# Patient Record
Sex: Male | Born: 1945 | Race: White | Hispanic: No | Marital: Married | Smoking: Former smoker
Health system: Southern US, Community
[De-identification: ages and names within clinical notes are randomized; demographics above are authoritative.]

## PROBLEM LIST (undated history)

## (undated) DIAGNOSIS — E785 Hyperlipidemia, unspecified: Secondary | ICD-10-CM

## (undated) DIAGNOSIS — E291 Testicular hypofunction: Secondary | ICD-10-CM

## (undated) DIAGNOSIS — K649 Unspecified hemorrhoids: Secondary | ICD-10-CM

## (undated) DIAGNOSIS — E039 Hypothyroidism, unspecified: Secondary | ICD-10-CM

## (undated) DIAGNOSIS — K5730 Diverticulosis of large intestine without perforation or abscess without bleeding: Secondary | ICD-10-CM

## (undated) DIAGNOSIS — N529 Male erectile dysfunction, unspecified: Secondary | ICD-10-CM

## (undated) DIAGNOSIS — N189 Chronic kidney disease, unspecified: Secondary | ICD-10-CM

## (undated) DIAGNOSIS — I10 Essential (primary) hypertension: Secondary | ICD-10-CM

## (undated) HISTORY — DX: Hypothyroidism, unspecified: E03.9

## (undated) HISTORY — DX: Male erectile dysfunction, unspecified: N52.9

## (undated) HISTORY — DX: Hyperlipidemia, unspecified: E78.5

## (undated) HISTORY — PX: HEMORRHOID SURGERY: SHX153

## (undated) HISTORY — DX: Testicular hypofunction: E29.1

## (undated) HISTORY — DX: Unspecified hemorrhoids: K64.9

## (undated) HISTORY — DX: Diverticulosis of large intestine without perforation or abscess without bleeding: K57.30

## (undated) HISTORY — PX: INGUINAL HERNIA REPAIR: SHX194

## (undated) HISTORY — DX: Chronic kidney disease, unspecified: N18.9

## (undated) HISTORY — PX: OTHER SURGICAL HISTORY: SHX169

## (undated) HISTORY — DX: Essential (primary) hypertension: I10

---

## 2002-05-01 ENCOUNTER — Encounter: Payer: Self-pay | Admitting: Endocrinology

## 2002-05-01 ENCOUNTER — Encounter: Admission: RE | Admit: 2002-05-01 | Discharge: 2002-05-01 | Payer: Self-pay | Admitting: Endocrinology

## 2004-12-11 ENCOUNTER — Ambulatory Visit: Payer: Self-pay | Admitting: Internal Medicine

## 2004-12-18 ENCOUNTER — Ambulatory Visit: Payer: Self-pay | Admitting: Internal Medicine

## 2005-12-24 ENCOUNTER — Ambulatory Visit: Payer: Self-pay | Admitting: Internal Medicine

## 2006-02-21 ENCOUNTER — Ambulatory Visit: Payer: Self-pay | Admitting: Internal Medicine

## 2006-04-06 ENCOUNTER — Ambulatory Visit (HOSPITAL_BASED_OUTPATIENT_CLINIC_OR_DEPARTMENT_OTHER): Admission: RE | Admit: 2006-04-06 | Discharge: 2006-04-06 | Payer: Self-pay

## 2006-04-12 ENCOUNTER — Ambulatory Visit: Payer: Self-pay | Admitting: Internal Medicine

## 2006-05-24 ENCOUNTER — Ambulatory Visit: Payer: Self-pay | Admitting: Internal Medicine

## 2006-05-31 ENCOUNTER — Ambulatory Visit: Payer: Self-pay | Admitting: Internal Medicine

## 2006-06-17 ENCOUNTER — Ambulatory Visit: Payer: Self-pay | Admitting: Internal Medicine

## 2006-08-22 ENCOUNTER — Ambulatory Visit: Payer: Self-pay | Admitting: Internal Medicine

## 2006-08-22 LAB — CONVERTED CEMR LAB
ALT: 23 units/L (ref 0–40)
AST: 25 units/L (ref 0–37)
Cholesterol: 172 mg/dL (ref 0–200)
Direct LDL: 87.7 mg/dL
HDL: 36.8 mg/dL — ABNORMAL LOW (ref >39.0)
Total CHOL/HDL Ratio: 4.7
Triglycerides: 263 mg/dL (ref 0–149)
VLDL: 53 mg/dL — ABNORMAL HIGH (ref 0–40)

## 2006-08-24 ENCOUNTER — Ambulatory Visit: Payer: Self-pay | Admitting: Internal Medicine

## 2007-02-10 ENCOUNTER — Encounter: Payer: Self-pay | Admitting: Internal Medicine

## 2007-02-10 DIAGNOSIS — E785 Hyperlipidemia, unspecified: Secondary | ICD-10-CM | POA: Insufficient documentation

## 2007-02-10 DIAGNOSIS — J309 Allergic rhinitis, unspecified: Secondary | ICD-10-CM | POA: Insufficient documentation

## 2007-03-09 ENCOUNTER — Ambulatory Visit: Payer: Self-pay | Admitting: Internal Medicine

## 2007-03-09 LAB — CONVERTED CEMR LAB
ALT: 22 units/L (ref 0–53)
AST: 23 units/L (ref 0–37)
Albumin: 3.8 g/dL (ref 3.5–5.2)
Alkaline Phosphatase: 82 units/L (ref 39–117)
BUN: 14 mg/dL (ref 6–23)
Bacteria, UA: NEGATIVE
Basophils Absolute: 0 10*3/uL (ref 0.0–0.1)
Basophils Relative: 0.6 % (ref 0.0–1.0)
Bilirubin Urine: NEGATIVE
Bilirubin, Direct: 0.1 mg/dL (ref 0.0–0.3)
CO2: 29 meq/L (ref 19–32)
Calcium: 9.2 mg/dL (ref 8.4–10.5)
Chloride: 109 meq/L (ref 96–112)
Cholesterol: 228 mg/dL (ref 0–200)
Creatinine, Ser: 1.1 mg/dL (ref 0.4–1.5)
Direct LDL: 68.2 mg/dL
Eosinophils Absolute: 0.2 10*3/uL (ref 0.0–0.6)
Eosinophils Relative: 3.8 % (ref 0.0–5.0)
GFR calc Af Amer: 88 mL/min
GFR calc non Af Amer: 73 mL/min
Glucose, Bld: 109 mg/dL — ABNORMAL HIGH (ref 70–99)
HCT: 44 % (ref 39.0–52.0)
HDL: 33.1 mg/dL — ABNORMAL LOW (ref >39.0)
Hemoglobin: 15.3 g/dL (ref 13.0–17.0)
Ketones, ur: NEGATIVE mg/dL
Leukocytes, UA: NEGATIVE
Lymphocytes Relative: 29.2 % (ref 12.0–46.0)
MCHC: 34.7 g/dL (ref 30.0–36.0)
MCV: 90.9 fL (ref 78.0–100.0)
Monocytes Absolute: 0.6 10*3/uL (ref 0.2–0.7)
Monocytes Relative: 11.1 % — ABNORMAL HIGH (ref 3.0–11.0)
Neutro Abs: 3.2 10*3/uL (ref 1.4–7.7)
Neutrophils Relative %: 55.3 % (ref 43.0–77.0)
Nitrite: NEGATIVE
PSA: 1.99 ng/mL (ref 0.10–4.00)
Platelets: 247 10*3/uL (ref 150–400)
Potassium: 5 meq/L (ref 3.5–5.1)
RBC: 4.85 M/uL (ref 4.22–5.81)
RDW: 12.8 % (ref 11.5–14.6)
Sodium: 143 meq/L (ref 135–145)
Specific Gravity, Urine: 1.025 (ref 1.000–1.03)
TSH: 0.06 microintl units/mL — ABNORMAL LOW (ref 0.35–5.50)
Total Bilirubin: 0.8 mg/dL (ref 0.3–1.2)
Total CHOL/HDL Ratio: 6.9
Total Protein, Urine: NEGATIVE mg/dL
Total Protein: 7.1 g/dL (ref 6.0–8.3)
Triglycerides: 699 mg/dL (ref 0–149)
Urine Glucose: NEGATIVE mg/dL
Urobilinogen, UA: 0.2 (ref 0.0–1.0)
VLDL: 140 mg/dL — ABNORMAL HIGH (ref 0–40)
Vit D, 1,25-Dihydroxy: 23 — ABNORMAL LOW (ref 30–89)
WBC: 5.6 10*3/uL (ref 4.5–10.5)
pH: 5.5 (ref 5.0–8.0)

## 2007-03-17 ENCOUNTER — Ambulatory Visit: Payer: Self-pay | Admitting: Internal Medicine

## 2007-03-17 ENCOUNTER — Encounter (INDEPENDENT_AMBULATORY_CARE_PROVIDER_SITE_OTHER): Payer: Self-pay | Admitting: *Deleted

## 2007-03-17 DIAGNOSIS — E039 Hypothyroidism, unspecified: Secondary | ICD-10-CM | POA: Insufficient documentation

## 2007-03-17 DIAGNOSIS — R030 Elevated blood-pressure reading, without diagnosis of hypertension: Secondary | ICD-10-CM | POA: Insufficient documentation

## 2007-03-17 DIAGNOSIS — K648 Other hemorrhoids: Secondary | ICD-10-CM | POA: Insufficient documentation

## 2007-03-17 DIAGNOSIS — K5730 Diverticulosis of large intestine without perforation or abscess without bleeding: Secondary | ICD-10-CM | POA: Insufficient documentation

## 2007-03-17 DIAGNOSIS — E559 Vitamin D deficiency, unspecified: Secondary | ICD-10-CM | POA: Insufficient documentation

## 2007-03-17 DIAGNOSIS — J209 Acute bronchitis, unspecified: Secondary | ICD-10-CM | POA: Insufficient documentation

## 2007-04-12 ENCOUNTER — Telehealth (INDEPENDENT_AMBULATORY_CARE_PROVIDER_SITE_OTHER): Payer: Self-pay | Admitting: *Deleted

## 2007-04-14 ENCOUNTER — Ambulatory Visit: Payer: Self-pay | Admitting: Internal Medicine

## 2007-04-14 DIAGNOSIS — M79609 Pain in unspecified limb: Secondary | ICD-10-CM | POA: Insufficient documentation

## 2007-04-14 DIAGNOSIS — M545 Low back pain, unspecified: Secondary | ICD-10-CM | POA: Insufficient documentation

## 2007-04-26 ENCOUNTER — Telehealth: Payer: Self-pay | Admitting: Internal Medicine

## 2007-05-05 ENCOUNTER — Encounter: Payer: Self-pay | Admitting: Internal Medicine

## 2007-06-15 ENCOUNTER — Encounter: Payer: Self-pay | Admitting: Internal Medicine

## 2007-08-02 ENCOUNTER — Ambulatory Visit: Payer: Self-pay | Admitting: Internal Medicine

## 2007-08-02 DIAGNOSIS — N529 Male erectile dysfunction, unspecified: Secondary | ICD-10-CM | POA: Insufficient documentation

## 2007-08-02 DIAGNOSIS — I10 Essential (primary) hypertension: Secondary | ICD-10-CM | POA: Insufficient documentation

## 2007-08-04 ENCOUNTER — Encounter: Payer: Self-pay | Admitting: Internal Medicine

## 2007-08-04 ENCOUNTER — Telehealth: Payer: Self-pay | Admitting: Internal Medicine

## 2007-11-29 ENCOUNTER — Ambulatory Visit: Payer: Self-pay | Admitting: Internal Medicine

## 2008-03-14 ENCOUNTER — Encounter: Payer: Self-pay | Admitting: Internal Medicine

## 2008-03-25 ENCOUNTER — Ambulatory Visit: Payer: Self-pay | Admitting: Internal Medicine

## 2008-03-25 LAB — CONVERTED CEMR LAB: Vit D, 1,25-Dihydroxy: 47 (ref 30–89)

## 2008-03-26 LAB — CONVERTED CEMR LAB
ALT: 19 units/L (ref 0–53)
AST: 22 units/L (ref 0–37)
Albumin: 3.8 g/dL (ref 3.5–5.2)
Alkaline Phosphatase: 65 units/L (ref 39–117)
BUN: 22 mg/dL (ref 6–23)
Bilirubin, Direct: 0.1 mg/dL (ref 0.0–0.3)
CO2: 27 meq/L (ref 19–32)
Calcium: 9.1 mg/dL (ref 8.4–10.5)
Chloride: 112 meq/L (ref 96–112)
Cholesterol: 169 mg/dL (ref 0–200)
Creatinine, Ser: 1.1 mg/dL (ref 0.4–1.5)
Direct LDL: 66.1 mg/dL
GFR calc Af Amer: 88 mL/min
GFR calc non Af Amer: 72 mL/min
Glucose, Bld: 109 mg/dL — ABNORMAL HIGH (ref 70–99)
HDL: 33.5 mg/dL — ABNORMAL LOW (ref >39.0)
Potassium: 4.7 meq/L (ref 3.5–5.1)
Sodium: 143 meq/L (ref 135–145)
TSH: 0.03 microintl units/mL — ABNORMAL LOW (ref 0.35–5.50)
Total Bilirubin: 0.6 mg/dL (ref 0.3–1.2)
Total CHOL/HDL Ratio: 5
Total Protein: 6.9 g/dL (ref 6.0–8.3)
Triglycerides: 244 mg/dL (ref 0–149)
VLDL: 49 mg/dL — ABNORMAL HIGH (ref 0–40)

## 2008-04-03 ENCOUNTER — Ambulatory Visit: Payer: Self-pay | Admitting: Internal Medicine

## 2008-07-26 ENCOUNTER — Ambulatory Visit: Payer: Self-pay | Admitting: Internal Medicine

## 2008-07-30 LAB — CONVERTED CEMR LAB: TSH: 24.96 microintl units/mL — ABNORMAL HIGH (ref 0.35–5.50)

## 2008-08-01 ENCOUNTER — Ambulatory Visit: Payer: Self-pay | Admitting: Internal Medicine

## 2008-08-16 ENCOUNTER — Encounter: Payer: Self-pay | Admitting: Internal Medicine

## 2008-09-05 ENCOUNTER — Ambulatory Visit: Payer: Self-pay | Admitting: Internal Medicine

## 2008-09-05 LAB — CONVERTED CEMR LAB: TSH: 26.15 microintl units/mL — ABNORMAL HIGH (ref 0.35–5.50)

## 2008-09-06 ENCOUNTER — Telehealth: Payer: Self-pay | Admitting: Internal Medicine

## 2008-11-04 ENCOUNTER — Ambulatory Visit: Payer: Self-pay | Admitting: Internal Medicine

## 2008-11-04 DIAGNOSIS — R5381 Other malaise: Secondary | ICD-10-CM | POA: Insufficient documentation

## 2008-11-04 DIAGNOSIS — R5383 Other fatigue: Secondary | ICD-10-CM

## 2008-12-03 ENCOUNTER — Telehealth: Payer: Self-pay | Admitting: Internal Medicine

## 2009-02-25 ENCOUNTER — Ambulatory Visit: Payer: Self-pay | Admitting: Internal Medicine

## 2009-02-27 LAB — CONVERTED CEMR LAB
BUN: 19 mg/dL (ref 6–23)
CO2: 28 meq/L (ref 19–32)
Calcium: 9.2 mg/dL (ref 8.4–10.5)
Chloride: 108 meq/L (ref 96–112)
Creatinine, Ser: 1.1 mg/dL (ref 0.4–1.5)
GFR calc non Af Amer: 72.07 mL/min (ref >60)
Glucose, Bld: 95 mg/dL (ref 70–99)
Potassium: 4.9 meq/L (ref 3.5–5.1)
Sodium: 143 meq/L (ref 135–145)
TSH: 0.06 microintl units/mL — ABNORMAL LOW (ref 0.35–5.50)

## 2009-03-05 ENCOUNTER — Ambulatory Visit: Payer: Self-pay | Admitting: Internal Medicine

## 2009-03-05 DIAGNOSIS — Z87891 Personal history of nicotine dependence: Secondary | ICD-10-CM | POA: Insufficient documentation

## 2009-03-05 DIAGNOSIS — E291 Testicular hypofunction: Secondary | ICD-10-CM | POA: Insufficient documentation

## 2009-05-26 ENCOUNTER — Ambulatory Visit: Payer: Self-pay | Admitting: Internal Medicine

## 2009-05-27 LAB — CONVERTED CEMR LAB
ALT: 19 units/L (ref 0–53)
AST: 22 units/L (ref 0–37)
Albumin: 4 g/dL (ref 3.5–5.2)
Alkaline Phosphatase: 60 units/L (ref 39–117)
BUN: 14 mg/dL (ref 6–23)
Basophils Absolute: 0 10*3/uL (ref 0.0–0.1)
Basophils Relative: 0.4 % (ref 0.0–3.0)
Bilirubin, Direct: 0.1 mg/dL (ref 0.0–0.3)
CO2: 22 meq/L (ref 19–32)
Calcium: 9.3 mg/dL (ref 8.4–10.5)
Chloride: 112 meq/L (ref 96–112)
Creatinine, Ser: 1.3 mg/dL (ref 0.4–1.5)
Eosinophils Absolute: 0.2 10*3/uL (ref 0.0–0.7)
Eosinophils Relative: 2.6 % (ref 0.0–5.0)
GFR calc non Af Amer: 59.38 mL/min (ref >60)
Glucose, Bld: 77 mg/dL (ref 70–99)
HCT: 43.4 % (ref 39.0–52.0)
Hemoglobin: 13.9 g/dL (ref 13.0–17.0)
Lymphocytes Relative: 27.4 % (ref 12.0–46.0)
Lymphs Abs: 1.6 10*3/uL (ref 0.7–4.0)
MCHC: 32.1 g/dL (ref 30.0–36.0)
MCV: 88.5 fL (ref 78.0–100.0)
Monocytes Absolute: 0.5 10*3/uL (ref 0.1–1.0)
Monocytes Relative: 7.9 % (ref 3.0–12.0)
Neutro Abs: 3.6 10*3/uL (ref 1.4–7.7)
Neutrophils Relative %: 61.7 % (ref 43.0–77.0)
PSA: 2.03 ng/mL (ref 0.10–4.00)
Platelets: 250 10*3/uL (ref 150.0–400.0)
Potassium: 5.1 meq/L (ref 3.5–5.1)
RBC: 4.91 M/uL (ref 4.22–5.81)
RDW: 15.1 % — ABNORMAL HIGH (ref 11.5–14.6)
Sodium: 147 meq/L — ABNORMAL HIGH (ref 135–145)
TSH: 0.08 microintl units/mL — ABNORMAL LOW (ref 0.35–5.50)
Testosterone: 59.22 ng/dL — ABNORMAL LOW (ref 350.00–890.00)
Total Bilirubin: 0.8 mg/dL (ref 0.3–1.2)
Total Protein: 7 g/dL (ref 6.0–8.3)
WBC: 5.9 10*3/uL (ref 4.5–10.5)

## 2009-06-05 ENCOUNTER — Ambulatory Visit: Payer: Self-pay | Admitting: Internal Medicine

## 2009-07-18 ENCOUNTER — Telehealth: Payer: Self-pay | Admitting: Internal Medicine

## 2009-07-21 ENCOUNTER — Telehealth: Payer: Self-pay | Admitting: Internal Medicine

## 2009-07-21 ENCOUNTER — Encounter: Payer: Self-pay | Admitting: Internal Medicine

## 2009-08-27 ENCOUNTER — Ambulatory Visit: Payer: Self-pay | Admitting: Internal Medicine

## 2009-08-27 LAB — CONVERTED CEMR LAB
Calcium: 8.9 mg/dL (ref 8.4–10.5)
Chloride: 110 meq/L (ref 96–112)
Creatinine, Ser: 1.2 mg/dL (ref 0.4–1.5)
GFR calc non Af Amer: 65.08 mL/min (ref >60)
TSH: 1.49 microintl units/mL (ref 0.35–5.50)
Testosterone: 274.82 ng/dL — ABNORMAL LOW (ref 350.00–890.00)
Total Bilirubin: 0.7 mg/dL (ref 0.3–1.2)

## 2009-09-04 ENCOUNTER — Ambulatory Visit: Payer: Self-pay | Admitting: Internal Medicine

## 2009-10-08 ENCOUNTER — Encounter: Payer: Self-pay | Admitting: Internal Medicine

## 2009-11-21 ENCOUNTER — Telehealth: Payer: Self-pay | Admitting: Internal Medicine

## 2010-01-01 ENCOUNTER — Ambulatory Visit: Payer: Self-pay | Admitting: Internal Medicine

## 2010-01-01 LAB — CONVERTED CEMR LAB
AST: 20 units/L (ref 0–37)
Alkaline Phosphatase: 64 units/L (ref 39–117)
Basophils Absolute: 0 10*3/uL (ref 0.0–0.1)
Bilirubin, Direct: 0.1 mg/dL (ref 0.0–0.3)
HCT: 37.5 % — ABNORMAL LOW (ref 39.0–52.0)
Lymphs Abs: 1.9 10*3/uL (ref 0.7–4.0)
MCV: 82.7 fL (ref 78.0–100.0)
Monocytes Absolute: 0.6 10*3/uL (ref 0.1–1.0)
Neutro Abs: 3.7 10*3/uL (ref 1.4–7.7)
Platelets: 305 10*3/uL (ref 150.0–400.0)
RDW: 16.6 % — ABNORMAL HIGH (ref 11.5–14.6)
Testosterone: 1317.68 ng/dL — ABNORMAL HIGH (ref 350.00–890.00)
Total Bilirubin: 0.6 mg/dL (ref 0.3–1.2)

## 2010-01-05 ENCOUNTER — Ambulatory Visit: Payer: Self-pay | Admitting: Internal Medicine

## 2010-01-05 DIAGNOSIS — R062 Wheezing: Secondary | ICD-10-CM | POA: Insufficient documentation

## 2010-06-09 NOTE — Letter (Signed)
Summary: [facility]   Imported By: Sherian Rein 11/12/2009 15:06:57  _____________________________________________________________________  External Attachment:    Type:   Image     Comment:   External Document

## 2010-06-09 NOTE — Assessment & Plan Note (Signed)
Summary: 3 mo rov /nws  #   Vital Signs:  Patient profile:   65 year old male Weight:      220 pounds BMI:     31.68 Temp:     98.5 degrees F oral Pulse rate:   69 / minute BP sitting:   126 / 90  (left arm)  Vitals Entered By: Tora Perches (June 05, 2009 8:05 AM) CC: f/u Is Patient Diabetic? No   CC:  f/u.  History of Present Illness: The patient presents for a follow up of hypertension, hypogonadism, hypothyroidism   Preventive Screening-Counseling & Management  Alcohol-Tobacco     Smoking Status: quit  Current Medications (verified): 1)  Allegra-D 12 Hour 60-120 Mg  Tb12 (Fexofenadine-Pseudoephedrine) .... Two Times A Day As Needed 2)  Crestor 20 Mg Tabs (Rosuvastatin Calcium) .Marland Kitchen.. 1 Tab Qod 3)  Enalapril Maleate 10 Mg  Tabs (Enalapril Maleate) .Marland Kitchen.. 1 Once Daily For Blood  Pressure 4)  Diazepam 2 Mg  Tabs (Diazepam) .Marland Kitchen.. 1 At Bedtime Prn 5)  Viagra 100 Mg Tabs (Sildenafil Citrate) .Marland Kitchen.. 1 By Mouth Once Daily As Needed 6)  Androgel Pump 1 % Gel (Testosterone) .Marland Kitchen.. 10g Every Day 7)  Vitamin D3 1000 Unit  Tabs (Cholecalciferol) .Marland Kitchen.. 1 By Mouth Daily 8)  Arginine 500 Mg Tabs (Arginine) .Marland Kitchen.. 1 By Mouth Qd 9)  Synthroid 125 Mcg Tabs (Levothyroxine Sodium) .Marland Kitchen.. 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    Physical Exam  General:  Well-developed,well-nourished,in no acute distress; alert,appropriate and cooperative throughout examination Nose:  External nasal examination shows no deformity or inflammation. Nasal mucosa are pink and moist  without lesions or exudates. Mouth:  Oral mucosa and oropharynx without lesions or exudates.  Teeth in good repair. Neck:  No deformities, masses, or tenderness noted. Lungs:  Normal respiratory effort, chest expands symmetrically. Lungs are clear to auscultation, no crackles or wheezes. Heart:  Normal rate and regular rhythm. S1 and S2 normal without gallop, murmur, click, rub or other extra sounds. Abdomen:  Bowel sounds positive,abdomen soft and non-tender without masses, organomegaly or hernias noted. Msk:  Lumbar-sacral spine is tender to palpation over paraspinal muscles and painfull with the ROM  Neurologic:  No cranial nerve deficits noted. Station and gait are normal. Plantar reflexes are down-going bilaterally. DTRs are  decreesed symmetrical throughout. Sensory, motor and coordinative functions appear intact. Skin:  Intact without suspicious lesions or rashes Psych:  Cognition and judgment appear intact. Alert and cooperative with normal attention span and concentration. No apparent delusions, illusions, hallucinations   Impression & Recommendations:  Problem # 1:  FATIGUE (ICD-780.79) Assessment Improved  Problem # 2:  HYPOGONADISM (ICD-257.2) Assessment: Comment Only The labs were reviewed with the patient. Clinically better - recheck testost in 3 months   Problem # 3:  HYPERTENSION (ICD-401.9) Assessment: Unchanged  His updated medication list for this problem includes:    Enalapril Maleate 10 Mg Tabs (Enalapril maleate) .Marland Kitchen... 1 once daily for blood  pressure  Problem # 4:  HYPOTHYROIDISM (ICD-244.9) Assessment: Comment Only  The following medications were removed from the medication list:    Synthroid 125 Mcg Tabs (  Levothyroxine sodium) .Marland Kitchen... 1 by mouth qd His updated medication list for this problem includes:    Levothroid 100 Mcg Tabs (Levothyroxine sodium) .Marland Kitchen... 1 by mouth once daily for thyroid  Complete Medication List: 1)  Allegra-d 12 Hour 60-120 Mg Tb12  (Fexofenadine-pseudoephedrine) .... Two times a day as needed 2)  Crestor 20 Mg Tabs (Rosuvastatin calcium) .Marland Kitchen.. 1 tab qod 3)  Enalapril Maleate 10 Mg Tabs (Enalapril maleate) .Marland Kitchen.. 1 once daily for blood  pressure 4)  Diazepam 2 Mg Tabs (Diazepam) .Marland Kitchen.. 1 at bedtime prn 5)  Viagra 100 Mg Tabs (Sildenafil citrate) .Marland Kitchen.. 1 by mouth once daily as needed 6)  Androgel Pump 1 % Gel (Testosterone) .Marland Kitchen.. 10g every day 7)  Vitamin D3 1000 Unit Tabs (Cholecalciferol) .Marland Kitchen.. 1 by mouth daily 8)  Arginine 500 Mg Tabs (Arginine) .Marland Kitchen.. 1 by mouth qd 9)  Levothroid 100 Mcg Tabs (Levothyroxine sodium) .Marland Kitchen.. 1 by mouth once daily for thyroid  Patient Instructions: 1)  Please schedule a follow-up appointment in 3 months. 2)  BMP prior to visit, ICD-9: 3)  testost 4)  TSH prior to visit, ICD-9:244.8 5)  Hepatic Panel prior to visit, ICD-9:  Contraindications/Deferment of Procedures/Staging:    Treatment: Flu Shot    Contraindication: other     Test/Procedure: Pneumovax vaccine    Reason for deferment: patient declined  Prescriptions: LEVOTHROID 100 MCG TABS (LEVOTHYROXINE SODIUM) 1 by mouth once daily for thyroid  #30 x 12   Entered and Authorized by:   Tresa Garter MD   Signed by:   Tresa Garter MD on 06/05/2009   Method used:   Print then Give to Patient   RxID:   [id number]

## 2010-06-09 NOTE — Medication Information (Signed)
Summary: Prior authorization/[facility]  Prior authorization/[facility]   Imported By: Lester Noc[place] 07/30/2009 10:44:46  _____________________________________________________________________  External Attachment:    Type:   Image     Comment:   External Document

## 2010-06-09 NOTE — Medication Information (Signed)
Summary: Fexofenadine/[facility]  Fexofenadine/[facility]   Imported By: Sherian Rein 08/12/2009 10:19:08  _____________________________________________________________________  External Attachment:    Type:   Image     Comment:   External Document  Appended Document: Fexofenadine/[facility] MD aware prescription approved. Signed doc.

## 2010-06-09 NOTE — Progress Notes (Signed)
Summary: Rf Viagra  Phone Note Refill Request Message from:  Pharmacy  Refills Requested: Medication #1:  VIAGRA 100 MG TABS 1 by mouth once daily as needed   Dosage confirmed as above?Dosage Confirmed   Supply Requested: #144   Last Refilled: 04/02/2009   Notes: please advise on quantity [facility] [address]  #[phone]   Method Requested: Telephone to Pharmacy Initial call taken by: Lanier Prude, [facility]),  November 21, 2009 4:29 PM  Follow-up for Phone Call        Pharm has pt recieved qty 144, gave refill same as emr records Follow-up by: Lamar Sprinkles, CMA,  November 21, 2009 6:10 PM    Prescriptions: VIAGRA 100 MG TABS (SILDENAFIL CITRATE) 1 by mouth once daily as needed  #12 x 5   Entered by:   Lamar Sprinkles, CMA   Authorized by:   Tresa Garter MD   Signed by:   Lamar Sprinkles, CMA on 11/21/2009   Method used:   Electronically to        [facility] [phone]* (retail)       [address]       [place], [place]  [address]       Ph: [phone]       Fax: [phone]   RxID:   [id number]

## 2010-06-09 NOTE — Assessment & Plan Note (Signed)
Summary: 3 MO ROV /NWS  #   Vital Signs:  Patient profile:   65 year old male Height:      70 inches (177.80 cm) Weight:      220 pounds (100.00 kg) BMI:     31.68 O2 Sat:      97 % on Room air Temp:     98.4 degrees F (36.89 degrees C) oral Pulse rate:   70 / minute Pulse rhythm:   regular BP sitting:   134 / 70  (left arm) Cuff size:   regular  Vitals Entered By: Brenton Grills (September 04, 2009 11:08 AM)  O2 Flow:  Room air CC: 3 mo f/u visit/aj   CC:  3 mo f/u visit/aj.  History of Present Illness: The patient presents for a follow up of hypertension, ED, hypogonadism   Current Medications (verified): 1)  Allegra-D 12 Hour 60-120 Mg  Tb12 (Fexofenadine-Pseudoephedrine) .... Two Times A Day As Needed 2)  Crestor 20 Mg Tabs (Rosuvastatin Calcium) .Marland Kitchen.. 1 Tab Qod 3)  Enalapril Maleate 10 Mg  Tabs (Enalapril Maleate) .Marland Kitchen.. 1 Once Daily For Blood  Pressure 4)  Diazepam 2 Mg  Tabs (Diazepam) .Marland Kitchen.. 1 At Bedtime Prn 5)  Viagra 100 Mg Tabs (Sildenafil Citrate) .Marland Kitchen.. 1 By Mouth Once Daily As Needed 6)  Androgel Pump 1 % Gel (Testosterone) .Marland Kitchen.. 10g Every Day 7)  Vitamin D3 1000 Unit  Tabs (Cholecalciferol) .Marland Kitchen.. 1 By Mouth Daily 8)  Arginine 500 Mg Tabs (Arginine) .Marland Kitchen.. 1 By Mouth Qd 9)  Levothroid 100 Mcg Tabs (Levothyroxine Sodium) .Marland Kitchen.. 1 By Mouth Once Daily For Thyroid  Allergies (verified): 1)  Cialis (Tadalafil)  Past History:  Past Medical History: Reviewed history from 08/02/2007 and no changes required. 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    Physical Exam  General:  Well-developed,well-nourished,in no acute distress; alert,appropriate and cooperative  throughout examination Nose:  External nasal examination shows no deformity or inflammation. Nasal mucosa are pink and moist without lesions or exudates. Mouth:  Oral mucosa and oropharynx without lesions or exudates.  Teeth in good repair. Lungs:  Normal respiratory effort, chest expands symmetrically. Lungs are clear to auscultation, no crackles or wheezes. Heart:  Normal rate and regular rhythm. S1 and S2 normal without gallop, murmur, click, rub or other extra sounds. Abdomen:  Bowel sounds positive,abdomen soft and non-tender without masses, organomegaly or hernias noted. Msk:  Lumbar-sacral spine is tender to palpation over paraspinal muscles and painfull with the ROM  Neurologic:  No cranial nerve deficits noted. Station and gait are normal. Plantar reflexes are down-going bilaterally. DTRs are  decreesed symmetrical throughout. Sensory, motor and coordinative functions appear intact. Skin:  Intact without suspicious lesions or rashes Psych:  Cognition and judgment appear intact. Alert and cooperative with normal attention span and concentration. No apparent delusions, illusions, hallucinations   Impression & Recommendations:  Problem # 1:  HYPERTENSION (ICD-401.9) Assessment Unchanged  His updated medication list for this problem includes:    Enalapril Maleate 10 Mg Tabs (Enalapril maleate) .Marland Kitchen... 1 once daily for blood  pressure  Problem # 2:  HYPOTHYROIDISM (ICD-244.9) Assessment: Improved  His updated medication list for this problem includes:    Levothroid 100 Mcg Tabs (Levothyroxine sodium) .Marland Kitchen... 1 by  mouth once daily for thyroid  Problem # 3:  HYPOGONADISM (ICD-257.2) Assessment: Improved On prescription therapy   Problem # 4:  ERECTILE DYSFUNCTION (QMV-784.69) Assessment: Improved  His updated medication list for this problem includes:    Viagra 100 Mg Tabs (Sildenafil citrate) .Marland Kitchen... 1 by mouth once daily as needed  Complete Medication List: 1)  Allegra-d 12 Hour  60-120 Mg Tb12 (Fexofenadine-pseudoephedrine) .... Two times a day as needed 2)  Crestor 20 Mg Tabs (Rosuvastatin calcium) .Marland Kitchen.. 1 tab qod 3)  Enalapril Maleate 10 Mg Tabs (Enalapril maleate) .Marland Kitchen.. 1 once daily for blood  pressure 4)  Diazepam 2 Mg Tabs (Diazepam) .Marland Kitchen.. 1 at bedtime prn 5)  Viagra 100 Mg Tabs (Sildenafil citrate) .Marland Kitchen.. 1 by mouth once daily as needed 6)  Androgel Pump 1 % Gel (Testosterone) .Marland Kitchen.. 10g every day 7)  Vitamin D3 1000 Unit Tabs (Cholecalciferol) .Marland Kitchen.. 1 by mouth daily 8)  Arginine 500 Mg Tabs (Arginine) .Marland Kitchen.. 1 by mouth qd 9)  Levothroid 100 Mcg Tabs (Levothyroxine sodium) .Marland Kitchen.. 1 by mouth once daily for thyroid  Patient Instructions: 1)  Please schedule a follow-up appointment in 4 months. 2)  Hepatic Panel prior to visit, ICD-9: 3)  CBC w/ Diff prior to visit, ICD-9: 4)  Testosterone 5)  TSH  244.8 995.20

## 2010-06-09 NOTE — Progress Notes (Signed)
Summary: Allegra PA  Phone Note From Pharmacy   Summary of Call: PA request--Allegra-D. Berkley Harvey was available via [email or web address], approved until 2012. Initial call taken by: Lucious Groves,  July 21, 2009 9:34 AM

## 2010-06-09 NOTE — Assessment & Plan Note (Signed)
Summary: 4 MO ROV /NWS  #   Vital Signs:  Patient profile:   65 year old male Height:      70 inches Weight:      228 pounds BMI:     32.83 O2 Sat:      96 % on Room air Temp:     98.6 degrees F oral Pulse rate:   73 / minute Pulse rhythm:   regular Resp:     16 per minute BP sitting:   148 / 64  (left arm) Cuff size:   regular  Vitals Entered By: Lanier Prude, CMA(AAMA) (January 05, 2010 7:57 AM)  O2 Flow:  Room air CC: 4 mo f/u Is Patient Diabetic? No   CC:  4 mo f/u.  History of Present Illness: The patient presents for a follow up of hypertension, hypothyroidism, hyperlipidemia C/o wt gain. Feeling better. He had some wheezing running  Current Medications (verified): 1)  Allegra-D 12 Hour 60-120 Mg  Tb12 (Fexofenadine-Pseudoephedrine) .... Two Times A Day As Needed 2)  Crestor 20 Mg Tabs (Rosuvastatin Calcium) .Marland Kitchen.. 1 Tab Qod 3)  Enalapril Maleate 10 Mg  Tabs (Enalapril Maleate) .Marland Kitchen.. 1 Once Daily For Blood  Pressure 4)  Diazepam 2 Mg  Tabs (Diazepam) .Marland Kitchen.. 1 At Bedtime Prn 5)  Viagra 100 Mg Tabs (Sildenafil Citrate) .Marland Kitchen.. 1 By Mouth Once Daily As Needed 6)  Androgel Pump 1 % Gel (Testosterone) .Marland Kitchen.. 10g Every Day 7)  Vitamin D3 1000 Unit  Tabs (Cholecalciferol) .Marland Kitchen.. 1 By Mouth Daily 8)  Arginine 500 Mg Tabs (Arginine) .Marland Kitchen.. 1 By Mouth Qd 9)  Levothroid 100 Mcg Tabs (Levothyroxine Sodium) .Marland Kitchen.. 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    Physical Exam  General:  Well-developed,well-nourished,in no acute distress; alert,appropriate and cooperative throughout examination Mouth:  Oral mucosa and oropharynx without lesions or exudates.  Teeth in good repair. Lungs:  Normal respiratory effort, chest expands symmetrically. Lungs are clear to auscultation, no crackles or wheezes. Heart:  Normal rate and regular rhythm. S1 and S2 normal without gallop, murmur, click, rub or other extra sounds. Abdomen:  Bowel sounds positive,abdomen soft and non-tender without masses, organomegaly or hernias noted. Msk:  No deformity or scoliosis noted of thoracic or lumbar spine.   Extremities:  No clubbing, cyanosis, edema, or deformity noted with normal full range of motion of all joints.   Neurologic:  No cranial nerve deficits noted. Station and gait are normal. Plantar reflexes are down-going bilaterally. DTRs are symmetrical throughout. Sensory, motor and coordinative functions appear intact. Skin:  Intact without suspicious lesions or rashes Psych:  Cognition and judgment appear intact. Alert and cooperative with normal attention span and concentration. No apparent delusions, illusions, hallucinations   Impression & Recommendations:  Problem # 1:  HYPOGONADISM (ICD-257.2) Assessment Improved reduce Androgel The labs were reviewed with the patient.   Problem # 2:  FATIGUE (ICD-780.79) Assessment: Improved  Problem # 3:  ERECTILE DYSFUNCTION (VHQ-469.62)  Assessment: Improved  His updated medication list for this problem includes:    Viagra 100 Mg Tabs (Sildenafil citrate) .Marland Kitchen... 1 by mouth once daily as needed  Problem # 4:  HYPERLIPIDEMIA (ICD-272.4) Assessment: Unchanged  His updated medication list for this problem includes:    Crestor 20 Mg Tabs (Rosuvastatin  calcium) .Marland Kitchen... 1 tab qod  Problem # 5:  HYPERTENSION (ICD-401.9) Assessment: Deteriorated Loose wt! His updated medication list for this problem includes:    Enalapril Maleate 10 Mg Tabs (Enalapril maleate) .Marland Kitchen... 1 once daily for blood  pressure  Problem # 6:  VITAMIN D DEFICIENCY (ICD-268.9) Assessment: Unchanged On the regimen of medicine(s) reflected in the chart    Problem # 7:  WHEEZING (ICD-786.07) ? asthma Assessment: New Proair Loose wt  Complete Medication List: 1)  Allegra-d 12 Hour 60-120 Mg Tb12 (Fexofenadine-pseudoephedrine) .... Two times a day as needed 2)  Crestor 20 Mg Tabs (Rosuvastatin calcium) .Marland Kitchen.. 1 tab qod 3)  Enalapril Maleate 10 Mg Tabs (Enalapril maleate) .Marland Kitchen.. 1 once daily for blood  pressure 4)  Diazepam 2 Mg Tabs (Diazepam) .Marland Kitchen.. 1 at bedtime prn 5)  Viagra 100 Mg Tabs (Sildenafil citrate) .Marland Kitchen.. 1 by mouth once daily as needed 6)  Androgel Pump 1 % Gel (Testosterone) .Marland Kitchen.. 10g every day 7)  Vitamin D3 1000 Unit Tabs (Cholecalciferol) .Marland Kitchen.. 1 by mouth daily 8)  Arginine 500 Mg Tabs (Arginine) .Marland Kitchen.. 1 by mouth qd 9)  Levothroid 112 Mcg Tabs (Levothyroxine sodium) .Marland Kitchen.. 1 by mouth qd 10)  Proair Hfa 108 (90 Base) Mcg/act Aers (Albuterol sulfate) .... 2 inh qid as needed  Patient Instructions: 1)  Please schedule a follow-up appointment in 4-6 months well w/labs and testost 257.20.  Contraindications/Deferment of Procedures/Staging:    Test/Procedure: FLU VAX    Reason for deferment: patient declined  Prescriptions: PROAIR HFA 108 (90 BASE) MCG/ACT AERS (ALBUTEROL SULFATE) 2 inh qid as needed  #3 x 3   Entered and Authorized by:   Tresa Garter MD   Signed by:   Tresa Garter MD on 01/05/2010   Method used:   Print then Give to Patient   RxID:   [id number] LEVOTHROID 112 MCG TABS (LEVOTHYROXINE SODIUM) 1 by mouth qd  #30 x 12   Entered and Authorized by:   Tresa Garter MD   Signed by:   Tresa Garter MD on 01/05/2010    Method used:   Electronically to        [facility] [phone]* (retail)       [address]       [place], [place]  [address]       Ph: [phone]       Fax: [phone]   RxID:   [id number]

## 2010-06-09 NOTE — Progress Notes (Signed)
Summary: Androgel PA  Phone Note Call from Patient Call back at [facility] Phone [phone]   Caller: [employer] [phone] Call For: ID:  [id number]  Case: [id number] Summary of Call: Patient called requesting refill of Allegra and to check the status of Androgel PA. I made the patient aware that we had not rec'd anything from his pharmacy, but would initiate PA anyway. Patient coverage is via [employer]. [employer] will fax forms. Initial call taken by: Lucious Groves,  July 18, 2009 1:24 PM  Follow-up for Phone Call        Form was completed and faxed. We will await reply from insurance company. Follow-up by: Lucious Groves,  July 28, 2009 8:25 AM    Prescriptions: ALLEGRA-D 12 HOUR 60-120 MG  TB12 (FEXOFENADINE-PSEUDOEPHEDRINE) two times a day as needed  #60.0 Each x 7   Entered by:   Lucious Groves   Authorized by:   Tresa Garter MD   Signed by:   Lucious Groves on 07/18/2009   Method used:   Electronically to        [facility] [address] [phone]* (retail)       [address]       [place], [place]  [address]       Ph: [phone]       Fax: [phone]   RxID:   [id number]   Appended Document: Androgel PA approved until 2016.

## 2010-06-26 ENCOUNTER — Other Ambulatory Visit: Payer: Self-pay | Admitting: Internal Medicine

## 2010-06-26 ENCOUNTER — Other Ambulatory Visit: Payer: BC Managed Care – PPO

## 2010-06-26 ENCOUNTER — Encounter (INDEPENDENT_AMBULATORY_CARE_PROVIDER_SITE_OTHER): Payer: Self-pay | Admitting: *Deleted

## 2010-06-26 DIAGNOSIS — Z0389 Encounter for observation for other suspected diseases and conditions ruled out: Secondary | ICD-10-CM

## 2010-06-26 DIAGNOSIS — E291 Testicular hypofunction: Secondary | ICD-10-CM

## 2010-06-26 DIAGNOSIS — E785 Hyperlipidemia, unspecified: Secondary | ICD-10-CM

## 2010-06-26 DIAGNOSIS — Z0000 Encounter for general adult medical examination without abnormal findings: Secondary | ICD-10-CM

## 2010-06-26 LAB — LIPID PANEL
HDL: 41.6 mg/dL (ref >39.00)
Total CHOL/HDL Ratio: 4

## 2010-06-26 LAB — CBC WITH DIFFERENTIAL/PLATELET
Eosinophils Absolute: 0.2 10*3/uL (ref 0.0–0.7)
Eosinophils Relative: 2.9 % (ref 0.0–5.0)
HCT: 41.5 % (ref 39.0–52.0)
Lymphs Abs: 2 10*3/uL (ref 0.7–4.0)
MCHC: 34 g/dL (ref 30.0–36.0)
MCV: 92.7 fl (ref 78.0–100.0)
Monocytes Absolute: 0.6 10*3/uL (ref 0.1–1.0)
Neutrophils Relative %: 62.7 % (ref 43.0–77.0)
Platelets: 273 10*3/uL (ref 150.0–400.0)
WBC: 7.5 10*3/uL (ref 4.5–10.5)

## 2010-06-26 LAB — TSH: TSH: 3.07 u[IU]/mL (ref 0.35–5.50)

## 2010-06-26 LAB — URINALYSIS
Bilirubin Urine: NEGATIVE
Hgb urine dipstick: NEGATIVE
Leukocytes, UA: NEGATIVE
Nitrite: NEGATIVE
pH: 5.5 (ref 5.0–8.0)

## 2010-06-26 LAB — HEPATIC FUNCTION PANEL
ALT: 19 U/L (ref 0–53)
Total Bilirubin: 0.7 mg/dL (ref 0.3–1.2)
Total Protein: 6.9 g/dL (ref 6.0–8.3)

## 2010-06-26 LAB — BASIC METABOLIC PANEL
CO2: 28 mEq/L (ref 19–32)
GFR: 55.24 mL/min — ABNORMAL LOW (ref >60.00)
Glucose, Bld: 88 mg/dL (ref 70–99)
Potassium: 5.5 mEq/L — ABNORMAL HIGH (ref 3.5–5.1)
Sodium: 141 mEq/L (ref 135–145)

## 2010-06-26 LAB — PSA: PSA: 2.97 ng/mL (ref 0.10–4.00)

## 2010-07-01 ENCOUNTER — Encounter: Payer: Self-pay | Admitting: Internal Medicine

## 2010-07-03 ENCOUNTER — Encounter (INDEPENDENT_AMBULATORY_CARE_PROVIDER_SITE_OTHER): Payer: BC Managed Care – PPO | Admitting: Internal Medicine

## 2010-07-03 ENCOUNTER — Encounter: Payer: Self-pay | Admitting: Internal Medicine

## 2010-07-03 DIAGNOSIS — E291 Testicular hypofunction: Secondary | ICD-10-CM

## 2010-07-03 DIAGNOSIS — E039 Hypothyroidism, unspecified: Secondary | ICD-10-CM

## 2010-07-03 DIAGNOSIS — M545 Low back pain, unspecified: Secondary | ICD-10-CM

## 2010-07-03 DIAGNOSIS — I10 Essential (primary) hypertension: Secondary | ICD-10-CM

## 2010-07-16 NOTE — Assessment & Plan Note (Signed)
Summary: PHYSICAL/NWS #   Vital Signs:  Patient profile:   65 year old male Height:      70 inches Weight:      219 pounds BMI:     31.54 O2 Sat:      94 % on Room air Temp:     98.6 degrees F oral Pulse rate:   81 / minute BP sitting:   138 / 82  (left arm) Cuff size:   regular  Vitals Entered By: Bill Salinas CMA (July 03, 2010 3:58 PM)  O2 Flow:  Room air CC: 3 month follow/ ab   CC:  3 month follow/ ab.  History of Present Illness: The patient presents for a follow up of hypertension, diabetes, hypogonadism  Current Medications (verified): 1)  Allegra-D 12 Hour 60-120 Mg  Tb12 (Fexofenadine-Pseudoephedrine) .... Two Times A Day As Needed 2)  Crestor 20 Mg Tabs (Rosuvastatin Calcium) .Marland Kitchen.. 1 Tab Qod 3)  Enalapril Maleate 10 Mg  Tabs (Enalapril Maleate) .Marland Kitchen.. 1 Once Daily For Blood  Pressure 4)  Diazepam 2 Mg  Tabs (Diazepam) .Marland Kitchen.. 1 At Bedtime Prn 5)  Viagra 100 Mg Tabs (Sildenafil Citrate) .Marland Kitchen.. 1 By Mouth Once Daily As Needed 6)  Androgel Pump 1 % Gel (Testosterone) .Marland Kitchen.. 10g Every Day 7)  Vitamin D3 1000 Unit  Tabs (Cholecalciferol) .Marland Kitchen.. 1 By Mouth Daily 8)  Arginine 500 Mg Tabs (Arginine) .Marland Kitchen.. 1 By Mouth Qd 9)  Levothroid 112 Mcg Tabs (Levothyroxine Sodium) .Marland Kitchen.. 1 By Mouth Qd 10)  Proair Hfa 108 (90 Base) Mcg/act Aers (Albuterol Sulfate) .... 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    Physical Exam  General:  Well-developed,well-nourished,in no acute distress;  alert,appropriate and cooperative throughout examination Mouth:  Oral mucosa and oropharynx without lesions or exudates.  Teeth in good repair. Neck:  No deformities, masses, or tenderness noted. Lungs:  Normal respiratory effort, chest expands symmetrically. Lungs are clear to auscultation, no crackles or wheezes. Heart:  Normal rate and regular rhythm. S1 and S2 normal without gallop, murmur, click, rub or other extra sounds. Abdomen:  Bowel sounds positive,abdomen soft and non-tender without masses, organomegaly or hernias noted. Msk:  No deformity or scoliosis noted of thoracic or lumbar spine.   Extremities:  No clubbing, cyanosis, edema, or deformity noted with normal full range of motion of all joints.   Neurologic:  No cranial nerve deficits noted. Station and gait are normal. Plantar reflexes are down-going bilaterally. DTRs are symmetrical throughout. Sensory, motor and coordinative functions appear intact. Skin:  Intact without suspicious lesions or rashes Psych:  Cognition and judgment appear intact. Alert and cooperative with normal attention span and concentration. No apparent delusions, illusions, hallucinations   Impression & Recommendations:  Problem # 1:  HYPERTENSION (ICD-401.9) Assessment Unchanged  His updated medication list for this problem includes:    Enalapril Maleate 10 Mg Tabs (Enalapril maleate) .Marland Kitchen... 1 once daily for blood  pressure  BP today: 138/82 Prior BP: 148/64 (01/05/2010)  Labs Reviewed: K+: 5.5 (06/26/2010) Creat: : 1.4 (06/26/2010)   Chol: 174 (06/26/2010)   HDL: 41.60 (  06/26/2010)   LDL: DEL (03/25/2008)   TG: 215.0 (06/26/2010)  Problem # 2:  HYPOGONADISM (ICD-257.2) Assessment: Deteriorated Restart the regimen of medicine(s) reflected in the chart    Problem # 3:  LOW BACK PAIN (ICD-724.2) Assessment: Improved  Problem # 4:  HYPOTHYROIDISM (ICD-244.9) Assessment: Unchanged  His updated medication list for this problem includes:     Levothroid 112 Mcg Tabs (Levothyroxine sodium) .Marland Kitchen... 1 by mouth qd  Labs Reviewed: TSH: 3.07 (06/26/2010)    Chol: 174 (06/26/2010)   HDL: 41.60 (06/26/2010)   LDL: DEL (03/25/2008)   TG: 215.0 (06/26/2010)  Complete Medication List: 1)  Allegra-d 12 Hour 60-120 Mg Tb12 (Fexofenadine-pseudoephedrine) .... Two times a day as needed 2)  Crestor 20 Mg Tabs (Rosuvastatin calcium) .Marland Kitchen.. 1 tab qod 3)  Enalapril Maleate 10 Mg Tabs (Enalapril maleate) .Marland Kitchen.. 1 once daily for blood  pressure 4)  Diazepam 2 Mg Tabs (Diazepam) .Marland Kitchen.. 1 at bedtime prn 5)  Viagra 100 Mg Tabs (Sildenafil citrate) .Marland Kitchen.. 1 by mouth once daily as needed 6)  Vitamin D3 1000 Unit Tabs (Cholecalciferol) .Marland Kitchen.. 1 by mouth daily 7)  Arginine 500 Mg Tabs (Arginine) .Marland Kitchen.. 1 by mouth qd 8)  Levothroid 112 Mcg Tabs (Levothyroxine sodium) .Marland Kitchen.. 1 by mouth qd 9)  Proair Hfa 108 (90 Base) Mcg/act Aers (Albuterol sulfate) .... 2 inh qid as needed 10)  Androgel Pump 1.6 % Gel (testosterone)  .... Use 4 pumps on skin q am  Patient Instructions: 1)  Please schedule a follow-up appointment in 4 months. 2)  BMP prior to visit, ICD-9: 3)  Hepatic Panel prior to visit, ICD-9: 4)  CBC w/ Diff prior to visit, ICD-9: 5)  testost 6)  Vit B12 782.0  257.20 Prescriptions: ANDROGEL PUMP 1.6 % GEL (TESTOSTERONE) use 4 pumps on skin q am  #1 x 12   Entered and Authorized by:   Tresa Garter MD   Signed by:   Tresa Garter MD on 07/03/2010   Method used:   Print then Give to Patient   RxID:   [id number]    Orders Added: 1)  Est. Patient Level IV [14782]

## 2010-08-25 ENCOUNTER — Other Ambulatory Visit: Payer: Self-pay | Admitting: Internal Medicine

## 2010-09-25 NOTE — Assessment & Plan Note (Signed)
[facility]                             PRIMARY CARE OFFICE NOTE   NAME:Donald Phillips, Donald Phillips                       MRN:          [id number]  DATE:02/21/2006                            DOB:          11/01/1945    The patient is a 65 year old male who presents for wellness examination.   ALLERGIES:  None.   PAST MEDICAL HISTORY, FAMILY HISTORY, SOCIAL HISTORY:  As per December 18, 2004, note.   CURRENT MEDICINES:  Reviewed with the patient.   REVIEW OF SYSTEMS:  No chest pain or shortness of breath.  No syncope.  Occasional pain in the right lateral shin.  Sensitive right hernia.  The  rest is negative.   PHYSICAL:  VITAL SIGNS:  Blood pressure 144/80, pulse 80, temperature 100.1,  weight 216 pounds.  GENERAL:  He looks well, he is in no acute distress.  HEENT:  __________  NECK:  No thyromegaly or bruit.  LUNGS:  Clear.  No wheeze or rales.  HEART:  S1, S2.  No murmur, no gallop.  ABDOMEN:  Soft, nontender, no organomegaly, no mass felt.  LOWER EXTREMITIES:  Without edema.  NEUROLOGIC:  He is alert, oriented and cooperative, denies being depressed.  There is a small right inguinal hernia present, nontender.  RECTAL:  Reveals slightly large prostate, no masses, no nodules.  SKIN:  Clear with aging changes.   LABORATORIES:  On December 24, 2005, CBC normal.  Cholesterol 273,  triglycerides 857, HDL 24.7.  TSH 6.22.  PSA 2.0.  EKG normal.   ASSESSMENT AND PLAN:  Normal wellness examination.  Age/health-related  issues discussed.  Health lifestyle discussed.  He refused the flu shot.  Will schedule colonoscopy with Dr. Leone Payor.  1. Right inguinal hernia.  Obtain surgical consultation, Dr. Orson Slick.  2. 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      Will watch.  He will call me if there are any problems.  3. Elevated triglycerides.  Given Vytorin 10/40.  Confirms compliance.      LFTs in 3 months.  I      am not sure how to  interpret the result.  Follow up with me in 3      months.  4. Erectile dysfunction.  Continue current therapy.            ______________________________  Georgina Quint. Plotnikov, MD      AVP/MedQ  DD:  02/28/2006  DT:  02/28/2006  Job #:  [id number]   cc:   Lebron Conners, M.D.  Iva Boop, MD,FACG

## 2010-09-25 NOTE — Op Note (Signed)
NAME:  Donald Phillips, ROKOSZ NO.:  [id number]   MEDICAL RECORD NO.:  [id number]          PATIENT TYPE:  AMB   LOCATION:  [facility]                         FACILITY[facility]  [facility]   PHYSICIAN:  Lebron Conners, M.D.   DATE OF BIRTH:  November 20, 1945   DATE OF PROCEDURE:  04/06/2006  DATE OF DISCHARGE:                               OPERATIVE REPORT   PREOPERATIVE DIAGNOSIS:  Right inguinal hernia   POSTOPERATIVE DIAGNOSIS:  Indirect right inguinal hernia.   SURGEON:  Lebron Conners, M.D.   ANESTHESIA:  General and local.   BLOOD LOSS:  Minimal.   COMPLICATIONS:  None.   CONDITION:  To PACU in good condition.   PROCEDURE:  After the patient was monitored and asleep and had routine  preparation and draping of the right inguinal region, I made an oblique  incision from just above the pubic tubercle lateral for about 6-7 cm.  I  dissected down through the fat until I identified the superficial  inguinal ring and then I freed up the external oblique lateral to that.  I opened the external oblique in the direction of its fibers into the  superficial ring exposing the spermatic cord which I noted to be  expanded.  I noted the ilioinguinal nerve and protected it.  I also  protected the iliohypogastric nerve.  I encircled the cord with a  Penrose drain at the level of the pubic tubercle then dissected the  cremaster fibers and areolar tissue away from the spermatic cord up to  the deep ring.  I found no defect in the medial floor.  I then incised  the spermatic cord longitudinally on the anterior aspect and separated  out a large amount of fat and indirect hernia sac from the cord vessels  and vas deferens.  I reduced the hernia through the deep ring and  plugged the superior aspect of the deep ring with a generous plug of  polypropylene mesh which I held in place with a stitch of 2-0 Vicryl in  the internal oblique muscle tightening the ring.  I then fashioned a  patch of polypropylene  mesh with a slit cut in it to fit the dissected  area of the inguinal floor.  I sewed that in from the pubic tubercle  medially and superiorly with a running basting stitch in the superficial  fascia of the internal oblique and laterally and inferiorly with running  simple stitch in the inguinal ligament.  That seemed to provide a very  secure and comfortable repair.  I used two Prolene sutures to join the  tails of the mesh together lateral to the cord completing the repair.  I  thoroughly anesthetized the operative area with long acting local  anesthetic.  I closed the external oblique and subcutaneous tissues with  running 3-0 Vicryl suture and closed the skin with interrupted  intracuticular 4-0 Vicryl reinforced by Steri-Strips.  The patient  tolerated the operation well.     Lebron Conners, M.D.  Electronically Signed    WB/MEDQ  D:  04/06/2006  T:  04/06/2006  Job:  [id number]

## 2010-10-20 ENCOUNTER — Telehealth: Payer: Self-pay | Admitting: *Deleted

## 2010-10-20 NOTE — Telephone Encounter (Signed)
PA request for: Androgel 1.62% pump Attempted to initiate case Online; received message: "This case cannot be processed. Please contact [employer] for assistance at [phone]".  [employer] states that Approval was given on this medication: Start Date 07/28/2009 End Date: 03-19/2016.  Case ID [id number]   [facility] pharmacy [facility] [address]] and relayed information as to status of PA/Approval per insurance and was told that they "are really short staffed right now & will not be able to get to this anytime soon".  LMOM to inform Pt to follow-up with pharmacy, as we have an approval on file for Rx and it will be up to pharmacy to follow thru w/[employer] to fill this prescription.

## 2010-10-27 ENCOUNTER — Other Ambulatory Visit (INDEPENDENT_AMBULATORY_CARE_PROVIDER_SITE_OTHER): Payer: Self-pay | Admitting: Surgery

## 2010-10-27 ENCOUNTER — Encounter (HOSPITAL_COMMUNITY): Payer: BC Managed Care – PPO

## 2010-10-27 ENCOUNTER — Ambulatory Visit (HOSPITAL_COMMUNITY)
Admission: RE | Admit: 2010-10-27 | Discharge: 2010-10-27 | Disposition: A | Discharge status: Home / Self Care | Payer: BC Managed Care – PPO | Source: Ambulatory Visit | Attending: Surgery | Admitting: Surgery

## 2010-10-27 DIAGNOSIS — E785 Hyperlipidemia, unspecified: Secondary | ICD-10-CM | POA: Insufficient documentation

## 2010-10-27 DIAGNOSIS — Z01818 Encounter for other preprocedural examination: Secondary | ICD-10-CM | POA: Insufficient documentation

## 2010-10-27 DIAGNOSIS — K649 Unspecified hemorrhoids: Secondary | ICD-10-CM | POA: Insufficient documentation

## 2010-10-27 DIAGNOSIS — M47814 Spondylosis without myelopathy or radiculopathy, thoracic region: Secondary | ICD-10-CM | POA: Insufficient documentation

## 2010-10-27 DIAGNOSIS — Z01811 Encounter for preprocedural respiratory examination: Secondary | ICD-10-CM

## 2010-10-27 DIAGNOSIS — I10 Essential (primary) hypertension: Secondary | ICD-10-CM | POA: Insufficient documentation

## 2010-10-27 DIAGNOSIS — Z01812 Encounter for preprocedural laboratory examination: Secondary | ICD-10-CM | POA: Insufficient documentation

## 2010-10-27 LAB — SURGICAL PCR SCREEN: MRSA, PCR: NEGATIVE

## 2010-10-27 LAB — CBC
MCH: 28 pg (ref 26.0–34.0)
MCHC: 31.2 g/dL (ref 30.0–36.0)
MCV: 89.6 fL (ref 78.0–100.0)
Platelets: 229 10*3/uL (ref 150–400)
RDW: 16.1 % — ABNORMAL HIGH (ref 11.5–15.5)

## 2010-10-27 LAB — BASIC METABOLIC PANEL
CO2: 26 mEq/L (ref 19–32)
Calcium: 9.6 mg/dL (ref 8.4–10.5)
Creatinine, Ser: 1.15 mg/dL (ref 0.50–1.35)
GFR calc Af Amer: >60 mL/min (ref >60)
GFR calc non Af Amer: >60 mL/min (ref >60)

## 2010-10-29 ENCOUNTER — Other Ambulatory Visit (INDEPENDENT_AMBULATORY_CARE_PROVIDER_SITE_OTHER): Payer: BC Managed Care – PPO

## 2010-10-29 DIAGNOSIS — R209 Unspecified disturbances of skin sensation: Secondary | ICD-10-CM

## 2010-10-29 DIAGNOSIS — E291 Testicular hypofunction: Secondary | ICD-10-CM

## 2010-10-29 LAB — CBC WITH DIFFERENTIAL/PLATELET
Basophils Relative: 0.5 % (ref 0.0–3.0)
Eosinophils Relative: 3.6 % (ref 0.0–5.0)
HCT: 43.3 % (ref 39.0–52.0)
Lymphs Abs: 2.2 10*3/uL (ref 0.7–4.0)
MCV: 90.3 fl (ref 78.0–100.0)
Monocytes Relative: 7.8 % (ref 3.0–12.0)
Platelets: 231 10*3/uL (ref 150.0–400.0)
RBC: 4.8 Mil/uL (ref 4.22–5.81)
WBC: 8 10*3/uL (ref 4.5–10.5)

## 2010-10-29 LAB — VITAMIN B12: Vitamin B-12: 221 pg/mL (ref 211–911)

## 2010-10-29 LAB — BASIC METABOLIC PANEL
BUN: 17 mg/dL (ref 6–23)
Calcium: 8.8 mg/dL (ref 8.4–10.5)
Chloride: 111 mEq/L (ref 96–112)
Creatinine, Ser: 1.2 mg/dL (ref 0.4–1.5)

## 2010-10-29 LAB — HEPATIC FUNCTION PANEL
Albumin: 4 g/dL (ref 3.5–5.2)
Alkaline Phosphatase: 65 U/L (ref 39–117)
Bilirubin, Direct: 0.1 mg/dL (ref 0.0–0.3)

## 2010-10-29 LAB — TESTOSTERONE: Testosterone: 134.02 ng/dL — ABNORMAL LOW (ref 350.00–890.00)

## 2010-10-30 ENCOUNTER — Other Ambulatory Visit: Payer: Self-pay | Admitting: Internal Medicine

## 2010-10-30 DIAGNOSIS — E291 Testicular hypofunction: Secondary | ICD-10-CM

## 2010-10-30 DIAGNOSIS — R209 Unspecified disturbances of skin sensation: Secondary | ICD-10-CM

## 2010-11-02 ENCOUNTER — Ambulatory Visit (HOSPITAL_COMMUNITY)
Admission: RE | Admit: 2010-11-02 | Discharge: 2010-11-02 | Disposition: A | Discharge status: Home / Self Care | Payer: BC Managed Care – PPO | Source: Ambulatory Visit | Attending: Surgery | Admitting: Surgery

## 2010-11-02 DIAGNOSIS — Z01810 Encounter for preprocedural cardiovascular examination: Secondary | ICD-10-CM | POA: Insufficient documentation

## 2010-11-02 DIAGNOSIS — Z01818 Encounter for other preprocedural examination: Secondary | ICD-10-CM | POA: Insufficient documentation

## 2010-11-02 DIAGNOSIS — K649 Unspecified hemorrhoids: Secondary | ICD-10-CM | POA: Insufficient documentation

## 2010-11-02 DIAGNOSIS — Z01812 Encounter for preprocedural laboratory examination: Secondary | ICD-10-CM | POA: Insufficient documentation

## 2010-11-02 DIAGNOSIS — K644 Residual hemorrhoidal skin tags: Secondary | ICD-10-CM

## 2010-11-03 ENCOUNTER — Telehealth (INDEPENDENT_AMBULATORY_CARE_PROVIDER_SITE_OTHER): Payer: Self-pay | Admitting: Surgery

## 2010-11-03 ENCOUNTER — Encounter: Payer: Self-pay | Admitting: Internal Medicine

## 2010-11-03 NOTE — Telephone Encounter (Signed)
Spoke to pt about trouble with slow urination and bladder being full. Spoke to Dr Michaell Cowing about the pt's complaints and this is retention from the hernia repair done 11-02-10. Pt adv. To take warm baths and urinate in the warm baths per Dr Michaell Cowing. If problems persist to call us back b/c might need to go see a urologist/ [facility] 11-03-10

## 2010-11-04 ENCOUNTER — Telehealth (INDEPENDENT_AMBULATORY_CARE_PROVIDER_SITE_OTHER): Payer: Self-pay | Admitting: Surgery

## 2010-11-04 ENCOUNTER — Encounter: Payer: Self-pay | Admitting: Internal Medicine

## 2010-11-04 NOTE — Telephone Encounter (Signed)
Pt called and notified having fever of 100 was 104 before took shower// no bleeding ,no pain,no discharge// spoke with dr gross told to inform pt  To try ibuprofen and monitor fever// call if symptoms change// in structed pt to be sure get up and move and breath deep to ventilate lungs/ pt will call if changes// [phone] eh

## 2010-11-05 ENCOUNTER — Ambulatory Visit (INDEPENDENT_AMBULATORY_CARE_PROVIDER_SITE_OTHER): Payer: BC Managed Care – PPO | Admitting: Internal Medicine

## 2010-11-05 ENCOUNTER — Encounter: Payer: Self-pay | Admitting: Internal Medicine

## 2010-11-05 VITALS — BP 108/68 | HR 88 | Temp 98.0°F | Resp 16 | Ht 70.0 in | Wt 221.0 lb

## 2010-11-05 DIAGNOSIS — E538 Deficiency of other specified B group vitamins: Secondary | ICD-10-CM | POA: Insufficient documentation

## 2010-11-05 DIAGNOSIS — E291 Testicular hypofunction: Secondary | ICD-10-CM

## 2010-11-05 MED ORDER — TESTOSTERONE 20.25 MG/ACT (1.62%) TD GEL: 6.0000 | TRANSDERMAL | Status: DC

## 2010-11-05 MED ORDER — CYANOCOBALAMIN 1000 MCG/ML IJ SOLN
1000.0000 ug | Freq: Once | INTRAMUSCULAR | Status: AC
  Administered 2010-11-05: 1000 ug via INTRAMUSCULAR

## 2010-11-05 MED ORDER — VITAMIN B-12 1000 MCG SL SUBL
1.0000 | SUBLINGUAL_TABLET | Freq: Every day | SUBLINGUAL | Status: AC
Start: 2010-11-05 — End: ?

## 2010-11-05 NOTE — Progress Notes (Signed)
  Subjective:    Patient ID: Donald Phillips, male    DOB: 06/04/1945, 65 y.o.   MRN: [id number]  HPI  The patient presents for a follow-up of  chronic hypertension, chronic dyslipidemia, hypogonadism controlled with medicines    Review of Systems  Constitutional: Negative for appetite change, fatigue and unexpected weight change.  HENT: Negative for nosebleeds, congestion, sore throat, sneezing, trouble swallowing and neck pain.   Eyes: Negative for itching and visual disturbance.  Respiratory: Negative for cough.   Cardiovascular: Negative for chest pain, palpitations and leg swelling.  Gastrointestinal: Negative for nausea, diarrhea, blood in stool and abdominal distention.  Genitourinary: Negative for frequency and hematuria.  Musculoskeletal: Negative for back pain, joint swelling and gait problem.  Skin: Negative for rash.  Neurological: Negative for dizziness, tremors, speech difficulty and weakness.  Psychiatric/Behavioral: Negative for sleep disturbance, dysphoric mood and agitation. The patient is not nervous/anxious.    Wt Readings from Last 3 Encounters:  11/05/10 221 lb (100.245 kg)  07/03/10 219 lb (99.338 kg)  01/05/10 228 lb (103.42 kg)       Objective:   Physical Exam  Constitutional: He is oriented to person, place, and time. He appears well-developed.  HENT:  Mouth/Throat: Oropharynx is clear and moist.  Eyes: Conjunctivae are normal. Pupils are equal, round, and reactive to light.  Neck: Normal range of motion. No JVD present. No thyromegaly present.  Cardiovascular: Normal rate, regular rhythm, normal heart sounds and intact distal pulses.  Exam reveals no gallop and no friction rub.   No murmur heard. Pulmonary/Chest: Effort normal and breath sounds normal. No respiratory distress. He has no wheezes. He has no rales. He exhibits no tenderness.  Abdominal: Soft. Bowel sounds are normal. He exhibits no distension and no mass. There is no tenderness. There is  no rebound and no guarding.  Musculoskeletal: Normal range of motion. He exhibits no edema and no tenderness.  Lymphadenopathy:    He has no cervical adenopathy.  Neurological: He is alert and oriented to person, place, and time. He has normal reflexes. No cranial nerve deficit. He exhibits normal muscle tone. Coordination normal.  Skin: Skin is warm and dry. No rash noted.  Psychiatric: He has a normal mood and affect. His behavior is normal. Judgment and thought content normal.       Lab Results  Component Value Date   WBC 8.0 10/29/2010   HGB 14.5 10/29/2010   HCT 43.3 10/29/2010   PLT 231.0 10/29/2010   CHOL 174 06/26/2010   TRIG 215.0* 06/26/2010   HDL 41.60 06/26/2010   LDLDIRECT 96.0 06/26/2010   ALT 20 10/29/2010   AST 26 10/29/2010   NA 141 10/29/2010   K 5.1 10/29/2010   CL 111 10/29/2010   CREATININE 1.2 10/29/2010   BUN 17 10/29/2010   CO2 26 10/29/2010   TSH 3.07 06/26/2010   PSA 2.97 06/26/2010      Assessment & Plan:

## 2010-11-05 NOTE — Assessment & Plan Note (Signed)
Start B12

## 2010-11-07 ENCOUNTER — Encounter: Payer: Self-pay | Admitting: Internal Medicine

## 2010-11-07 NOTE — Assessment & Plan Note (Signed)
He wants to stay on Androgel 1.62% and increase dose by 1 pump

## 2010-11-09 ENCOUNTER — Other Ambulatory Visit (INDEPENDENT_AMBULATORY_CARE_PROVIDER_SITE_OTHER): Payer: BC Managed Care – PPO

## 2010-11-09 ENCOUNTER — Telehealth: Payer: Self-pay | Admitting: *Deleted

## 2010-11-09 DIAGNOSIS — L750 Bromhidrosis: Secondary | ICD-10-CM

## 2010-11-09 DIAGNOSIS — L748 Other eccrine sweat disorders: Secondary | ICD-10-CM

## 2010-11-09 DIAGNOSIS — R3911 Hesitancy of micturition: Secondary | ICD-10-CM

## 2010-11-09 LAB — URINALYSIS, ROUTINE W REFLEX MICROSCOPIC
Nitrite: NEGATIVE
Specific Gravity, Urine: 1.025 (ref 1.000–1.030)
Urobilinogen, UA: 0.2 (ref 0.0–1.0)
pH: 5.5 (ref 5.0–8.0)

## 2010-11-09 MED ORDER — CIPROFLOXACIN HCL 500 MG PO TABS: 500.0000 mg | ORAL_TABLET | Freq: Two times a day (BID) | ORAL | Status: AC

## 2010-11-09 NOTE — Telephone Encounter (Signed)
Results ready, please advise.

## 2010-11-09 NOTE — Telephone Encounter (Signed)
Donald Phillips, please, inform patient that all labs are normal except for a UTI Take abx

## 2010-11-09 NOTE — Telephone Encounter (Signed)
Spoke w/pt - he had hemorrhoidectomy last week and was seen by Dr Macario Golds Thursday. This weekend pt c/o urinary odor and spoke w/surgeon b/c that was one of the symptoms he had been advised to call office about. They told pt to call PCP. Pt says he had some urinary hesitancy last week but none now. I put in order for u/a b/c pt was worried about possible uti.

## 2010-11-09 NOTE — Telephone Encounter (Signed)
Agree w/UA Thx

## 2010-11-10 NOTE — Telephone Encounter (Signed)
Pt informed

## 2010-11-16 NOTE — Op Note (Signed)
[name], Donald Phillips NO.:  [id number]  MEDICAL RECORD NO.:  [id number]  LOCATION:  [facility]                         FACILITY:  [facility]  PHYSICIAN:  Ardeth Sportsman, MD     DATE OF BIRTH:  [birth year]  DATE OF PROCEDURE:  11/02/2010 DATE OF DISCHARGE:  11/02/2010                              OPERATIVE REPORT   PRIMARY CARE PHYSICIAN:  Georgina Quint. Plotnikov, MD  GASTROENTEROLOGIST:  Iva Boop, MD, Clementeen Graham, with [facility]  SURGEON:  Ardeth Sportsman, MD  ASSISTANT:  RN  PREOPERATIVE DIAGNOSIS:  Recurrent prolapsing hemorrhoids.  POSTOPERATIVE DIAGNOSIS:  Recurrent prolapsing hemorrhoids.  PROCEDURE PERFORMED:  Hemorrhoidal arterial ligation under Doppler guidance with hemorrhoidopexy using THD System.  ANESTHESIA: 1. General anesthesia. 2. Bilateral anorectal block at the end of case.  SPECIMENS:  None.  DRAINS:  None.  ESTIMATED BLOOD LOSS:  Minimal.  COMPLICATIONS:  None apparent.INDICATIONS:  Donald Phillips is a 65 year old gentleman with history of hemorrhoidectomy in the 1990s with history of recurrent prolapsing hemorrhoids.  He had injections in 2009 and 2011.  They helped for a short while and he has had a good bowel regimen, but now they have persisted and gotten worse this past year.  The anatomy and physiology of anorectal function was discussed and pathophysiology of hemorrhoids was discussed.  Given the fact that he is constantly prolapsed now grade 3-4, recommended consideration of surgery.  Options and alternatives were discussed.  Risks, benefits, and alternatives were discussed. Questions answered and he agreed to proceed with [facility] hemorrhoidal ligation using anoscopic Doppler guidance with hemorrhoidopexy.  OPERATIVE FINDINGS:  He had a right posterior chronically prolapsed hemorrhoid, greater than the right anterior intermittently prolapsing hemorrhoid.  The left lateral pile had some redundancy and some inflammation, but no  major prolapse.  DESCRIPTION OF PROCEDURE:  Informed consent was confirmed.  The patient had given himself enema preoperatively with Fleets.  He underwent general anesthesia without any difficulty.  He received IV cefoxitin. He had sequential compression devices active during the entire case.  He was positioned in the prone jackknife position.  His buttocks were taped apart and the perianal and perineal regions were prepped and draped in sterile fashion.  Surgical time-out confirmed our plan.  We did rectal dissection, found a normal prostate, and felt no mucosal abnormalities.  He had an obvious right posterior prolapsed hemorrhoid, greater than right anterior.  I did general dilation and was able to inflate a moderate-sized anoscope to confirm evidence of prior probable some scarring in the left lateral aspect consistent most likely with a hemorrhoidectomy from before.  He had normal sphincter tone.  There were no fistulas or fissures.  He had some redundant rectum, especially in the right posterior aspect.  Using the [facility] Doppler-guided anoscope, I was able to use the Doppler and isolate the hemorrhoidal arteries.  I was able to ligate them using a THD system using a 2-0 on a UR-6 curved needle, using the system about 5 cm proximal to the white line of [place].  I did figure-of-8 ligation up the feeding artery and then run more superficial mucosal bites to about a centimeter proximal to the white  [employer] with each bite.  I did this in 6 locations.  These were primarily at 1 o'clock, 3 o'clock, 5 o'clock, 7 o'clock, 9 o'clock, and 11 o'clock in the standard lithotomy position.  These were slightly shifted, but within a few degrees.  At the end of doing the 6 points of hemorrhoidal ligation and hemorrhoidopexy, at the end, I did Doppler and confirmed no pulsatile blood flow distal to the ligation areas.  Mucosa looked pink and healthy.  The prolapse had been pexided back in with  the ligation.  Hemostasis was excellent.  I placed anorectal block.  The patient was extubated and taken to the recovery room in stable condition.  I had discussed postoperative care with the patient in the office and again just prior to surgery. Instructions are written and I am about to discuss with his family as well.     Ardeth Sportsman, MD     SCG/MEDQ  D:  11/02/2010  T:  11/03/2010  Job:  [id number]  cc:   Georgina Quint. Plotnikov, MD [address] [place] [address]  Iva Boop, MD,FACG [facility] [address] [place], [place] [address]  Electronically Signed by Karie Soda MD on 11/16/2010 09:14:24 AM

## 2010-11-25 ENCOUNTER — Other Ambulatory Visit: Payer: Self-pay | Admitting: Internal Medicine

## 2010-11-30 ENCOUNTER — Ambulatory Visit (INDEPENDENT_AMBULATORY_CARE_PROVIDER_SITE_OTHER): Payer: BC Managed Care – PPO | Admitting: Surgery

## 2010-11-30 ENCOUNTER — Encounter (INDEPENDENT_AMBULATORY_CARE_PROVIDER_SITE_OTHER): Payer: Self-pay | Admitting: Surgery

## 2010-11-30 VITALS — BP 130/78 | HR 76 | Temp 97.4°F | Ht 70.0 in | Wt 213.4 lb

## 2010-11-30 DIAGNOSIS — K649 Unspecified hemorrhoids: Secondary | ICD-10-CM

## 2010-11-30 NOTE — Patient Instructions (Signed)
Continue fiber/bowel regimen.

## 2010-11-30 NOTE — Progress Notes (Addendum)
Subjective:     Patient ID: Donald Phillips., male   DOB: 1945-12-22, 65 y.o.   MRN: [id number]  HPI  Donald Phillips comes a feeling well. He had some discomfort the first week. He did not need narcotics. He did some warm soaks and that helped. He had a little difficulty with some urgency and fullness but that is markedly improved. He is having one daily bowel movement a day. He is using a stool softener in the morning and a fiber supplement in the evening. He feels well. No bleeding. No fevers chills or sweats.  Review of Systems  Constitutional: Negative for fever, chills and diaphoresis.  HENT: Negative for nosebleeds, sore throat, facial swelling, mouth sores, trouble swallowing and ear discharge.   Eyes: Negative for photophobia, discharge and visual disturbance.  Respiratory: Negative for choking, chest tightness, shortness of breath and stridor.   Cardiovascular: Negative for chest pain and palpitations.  Gastrointestinal: Negative for nausea, vomiting, abdominal pain, diarrhea, constipation, blood in stool, abdominal distention, anal bleeding and rectal pain.  Genitourinary: Negative for dysuria, urgency, difficulty urinating and testicular pain.  Musculoskeletal: Negative for myalgias, back pain, arthralgias and gait problem.  Skin: Negative for color change, pallor, rash and wound.  Neurological: Negative for dizziness, speech difficulty, weakness, numbness and headaches.  Hematological: Negative for adenopathy. Does not bruise/bleed easily.  Psychiatric/Behavioral: Negative for hallucinations, confusion and agitation.       Objective:   Physical Exam  Constitutional: He is oriented to person, place, and time. He appears well-developed and well-nourished. No distress.  HENT:  Head: Normocephalic.  Mouth/Throat: Oropharynx is clear and moist.  Eyes: EOM are normal. Pupils are equal, round, and reactive to light.  Neck: Normal range of motion. No tracheal deviation present.    Cardiovascular: Intact distal pulses.   Pulmonary/Chest: Effort normal. No respiratory distress.  Abdominal: Soft. There is no tenderness.  Genitourinary: Rectum normal.  Musculoskeletal: Normal range of motion. He exhibits no tenderness.  Neurological: He is alert and oriented to person, place, and time. No cranial nerve deficit. Coordination normal.  Psychiatric: He has a normal mood and affect. His behavior is normal.       Assessment:     Status post THD ligation of prolapsing and bleeding hemorrhoids. Recovering quite well.    Plan:     Continue fiber regimen.  Continue using wet wipes & avoid straining.  Return to clinic p.r.n.   He expressed appreciation. I am glad he has had a good result.

## 2011-01-29 ENCOUNTER — Other Ambulatory Visit: Payer: Self-pay | Admitting: Internal Medicine

## 2011-02-01 ENCOUNTER — Other Ambulatory Visit (INDEPENDENT_AMBULATORY_CARE_PROVIDER_SITE_OTHER): Payer: BC Managed Care – PPO

## 2011-02-01 DIAGNOSIS — E538 Deficiency of other specified B group vitamins: Secondary | ICD-10-CM

## 2011-02-01 DIAGNOSIS — E291 Testicular hypofunction: Secondary | ICD-10-CM

## 2011-02-01 LAB — COMPREHENSIVE METABOLIC PANEL
ALT: 17 U/L (ref 0–53)
AST: 21 U/L (ref 0–37)
Albumin: 3.8 g/dL (ref 3.5–5.2)
Alkaline Phosphatase: 65 U/L (ref 39–117)
BUN: 16 mg/dL (ref 6–23)
Calcium: 8.8 mg/dL (ref 8.4–10.5)
Chloride: 106 mEq/L (ref 96–112)
Potassium: 4.6 mEq/L (ref 3.5–5.1)
Sodium: 140 mEq/L (ref 135–145)

## 2011-02-01 LAB — VITAMIN B12: Vitamin B-12: 941 pg/mL — ABNORMAL HIGH (ref 211–911)

## 2011-02-08 ENCOUNTER — Encounter: Payer: Self-pay | Admitting: Internal Medicine

## 2011-02-08 ENCOUNTER — Ambulatory Visit (INDEPENDENT_AMBULATORY_CARE_PROVIDER_SITE_OTHER): Payer: BC Managed Care – PPO | Admitting: Internal Medicine

## 2011-02-08 VITALS — BP 130/90 | HR 76 | Temp 98.1°F | Resp 16 | Wt 212.0 lb

## 2011-02-08 DIAGNOSIS — Z0000 Encounter for general adult medical examination without abnormal findings: Secondary | ICD-10-CM

## 2011-02-08 DIAGNOSIS — E291 Testicular hypofunction: Secondary | ICD-10-CM

## 2011-02-08 DIAGNOSIS — I10 Essential (primary) hypertension: Secondary | ICD-10-CM

## 2011-02-08 DIAGNOSIS — N529 Male erectile dysfunction, unspecified: Secondary | ICD-10-CM

## 2011-02-08 DIAGNOSIS — E785 Hyperlipidemia, unspecified: Secondary | ICD-10-CM

## 2011-02-08 MED ORDER — ROSUVASTATIN CALCIUM 20 MG PO TABS: 20.0000 mg | ORAL_TABLET | Freq: Every day | ORAL | Status: DC

## 2011-02-08 NOTE — Assessment & Plan Note (Signed)
Continue with current prescription therapy as reflected on the Med list.  

## 2011-02-08 NOTE — Progress Notes (Signed)
  Subjective:    Patient ID: Donald Sayres., male    DOB: 11-Dec-1945, 65 y.o.   MRN: [id number]  HPI  The patient presents for a follow-up of  chronic hypertension, chronic dyslipidemia, hypogonadism controlled with medicines    Review of Systems  Constitutional: Negative for appetite change, fatigue and unexpected weight change.  HENT: Negative for nosebleeds, congestion, sore throat, sneezing, trouble swallowing and neck pain.   Eyes: Negative for itching and visual disturbance.  Respiratory: Negative for cough.   Cardiovascular: Negative for chest pain, palpitations and leg swelling.  Gastrointestinal: Negative for nausea, diarrhea, blood in stool and abdominal distention.  Genitourinary: Negative for frequency and hematuria.  Musculoskeletal: Negative for back pain, joint swelling and gait problem.  Skin: Negative for rash.  Neurological: Negative for dizziness, tremors, speech difficulty and weakness.  Psychiatric/Behavioral: Negative for sleep disturbance, dysphoric mood and agitation. The patient is not nervous/anxious.        Objective:   Physical Exam  Constitutional: He is oriented to person, place, and time. He appears well-developed.  HENT:  Mouth/Throat: Oropharynx is clear and moist.  Eyes: Conjunctivae are normal. Pupils are equal, round, and reactive to light.  Neck: Normal range of motion. No JVD present. No thyromegaly present.  Cardiovascular: Normal rate, regular rhythm, normal heart sounds and intact distal pulses.  Exam reveals no gallop and no friction rub.   No murmur heard. Pulmonary/Chest: Effort normal and breath sounds normal. No respiratory distress. He has no wheezes. He has no rales. He exhibits no tenderness.  Abdominal: Soft. Bowel sounds are normal. He exhibits no distension and no mass. There is no tenderness. There is no rebound and no guarding.  Musculoskeletal: Normal range of motion. He exhibits tenderness (R heel is tender). He exhibits no  edema.  Lymphadenopathy:    He has no cervical adenopathy.  Neurological: He is alert and oriented to person, place, and time. He has normal reflexes. No cranial nerve deficit. He exhibits normal muscle tone. Coordination normal.  Skin: Skin is warm and dry. No rash noted.  Psychiatric: He has a normal mood and affect. His behavior is normal. Judgment and thought content normal.          Assessment & Plan:

## 2011-02-28 ENCOUNTER — Other Ambulatory Visit: Payer: Self-pay | Admitting: Internal Medicine

## 2011-06-14 ENCOUNTER — Ambulatory Visit: Payer: BC Managed Care – PPO | Admitting: Internal Medicine

## 2011-08-08 ENCOUNTER — Other Ambulatory Visit: Payer: Self-pay | Admitting: Internal Medicine

## 2011-08-20 ENCOUNTER — Other Ambulatory Visit: Payer: Self-pay | Admitting: Internal Medicine

## 2011-08-24 ENCOUNTER — Telehealth: Payer: Self-pay | Admitting: *Deleted

## 2011-08-24 NOTE — Telephone Encounter (Signed)
Requested Medications     ANDROGEL PUMP 20.25 MG/ACT (1.62%) GEL [Pharmacy Med Name: ANDROGEL PUMP 1.62% GEL]   USE 4 PUMPS ON SKIN EVERY MORNING   Disp: 150 g R: 3 Start: 08/20/2011  Class: Normal   Requested on: 01/29/2011   Originally ordered on: 11/03/2010  Last refill: 06/23/2011

## 2011-08-24 NOTE — Telephone Encounter (Signed)
OK to fill this prescription with additional refills x5 Thank you!  

## 2011-08-25 MED ORDER — TESTOSTERONE 20.25 MG/ACT (1.62%) TD GEL: 4.0000 | TRANSDERMAL | Status: DC

## 2011-08-25 NOTE — Telephone Encounter (Signed)
Done

## 2011-09-06 ENCOUNTER — Other Ambulatory Visit (INDEPENDENT_AMBULATORY_CARE_PROVIDER_SITE_OTHER): Payer: BC Managed Care – PPO

## 2011-09-06 DIAGNOSIS — E291 Testicular hypofunction: Secondary | ICD-10-CM

## 2011-09-06 DIAGNOSIS — I10 Essential (primary) hypertension: Secondary | ICD-10-CM

## 2011-09-06 LAB — COMPREHENSIVE METABOLIC PANEL
ALT: 24 U/L (ref 0–53)
Albumin: 3.9 g/dL (ref 3.5–5.2)
CO2: 27 mEq/L (ref 19–32)
Calcium: 8.9 mg/dL (ref 8.4–10.5)
Chloride: 105 mEq/L (ref 96–112)
GFR: 68.6 mL/min (ref >60.00)
Glucose, Bld: 103 mg/dL — ABNORMAL HIGH (ref 70–99)
Sodium: 139 mEq/L (ref 135–145)
Total Bilirubin: 0.7 mg/dL (ref 0.3–1.2)
Total Protein: 7.3 g/dL (ref 6.0–8.3)

## 2011-09-06 LAB — TESTOSTERONE: Testosterone: 396.03 ng/dL (ref 350.00–890.00)

## 2011-09-08 ENCOUNTER — Other Ambulatory Visit: Payer: Self-pay | Admitting: Internal Medicine

## 2011-09-17 ENCOUNTER — Other Ambulatory Visit (INDEPENDENT_AMBULATORY_CARE_PROVIDER_SITE_OTHER): Payer: BC Managed Care – PPO

## 2011-09-17 ENCOUNTER — Encounter: Payer: Self-pay | Admitting: Internal Medicine

## 2011-09-17 ENCOUNTER — Ambulatory Visit (INDEPENDENT_AMBULATORY_CARE_PROVIDER_SITE_OTHER): Payer: BC Managed Care – PPO | Admitting: Internal Medicine

## 2011-09-17 VITALS — BP 160/92 | HR 80 | Temp 97.7°F | Resp 16 | Wt 215.0 lb

## 2011-09-17 DIAGNOSIS — E875 Hyperkalemia: Secondary | ICD-10-CM

## 2011-09-17 DIAGNOSIS — E559 Vitamin D deficiency, unspecified: Secondary | ICD-10-CM

## 2011-09-17 DIAGNOSIS — E538 Deficiency of other specified B group vitamins: Secondary | ICD-10-CM

## 2011-09-17 DIAGNOSIS — E039 Hypothyroidism, unspecified: Secondary | ICD-10-CM

## 2011-09-17 DIAGNOSIS — N529 Male erectile dysfunction, unspecified: Secondary | ICD-10-CM

## 2011-09-17 DIAGNOSIS — E291 Testicular hypofunction: Secondary | ICD-10-CM

## 2011-09-17 DIAGNOSIS — R030 Elevated blood-pressure reading, without diagnosis of hypertension: Secondary | ICD-10-CM

## 2011-09-17 LAB — BASIC METABOLIC PANEL
BUN: 17 mg/dL (ref 6–23)
Creatinine, Ser: 1.3 mg/dL (ref 0.4–1.5)
GFR: 58.43 mL/min — ABNORMAL LOW (ref >60.00)

## 2011-09-17 NOTE — Assessment & Plan Note (Signed)
Continue with current prescription therapy as reflected on the Med list.  

## 2011-09-17 NOTE — Progress Notes (Signed)
Patient ID: Donald Phillips., male   DOB: [birth year], 66 y.o.   MRN: [id number]  Subjective:    Patient ID: Donald Phillips., male    DOB: 1945-11-12, 66 y.o.   MRN: [id number]  HPI  The patient presents for a follow-up of  chronic hypertension, chronic dyslipidemia, hypogonadism controlled with medicines. He had elev K on labs  Wt Readings from Last 3 Encounters:  09/17/11 215 lb (97.523 kg)  02/08/11 212 lb (96.163 kg)  11/30/10 213 lb 6.4 oz (96.798 kg)   BP Readings from Last 3 Encounters:  09/17/11 160/92  02/08/11 130/90  11/30/10 130/78        Review of Systems  Constitutional: Negative for appetite change, fatigue and unexpected weight change.  HENT: Negative for nosebleeds, congestion, sore throat, sneezing, trouble swallowing and neck pain.   Eyes: Negative for itching and visual disturbance.  Respiratory: Negative for cough.   Cardiovascular: Negative for chest pain, palpitations and leg swelling.  Gastrointestinal: Negative for nausea, diarrhea, blood in stool and abdominal distention.  Genitourinary: Negative for frequency and hematuria.  Musculoskeletal: Negative for back pain, joint swelling and gait problem.  Skin: Negative for rash.  Neurological: Negative for dizziness, tremors, speech difficulty and weakness.  Psychiatric/Behavioral: Negative for sleep disturbance, dysphoric mood and agitation. The patient is not nervous/anxious.        Objective:   Physical Exam  Constitutional: He is oriented to person, place, and time. He appears well-developed.  HENT:  Mouth/Throat: Oropharynx is clear and moist.  Eyes: Conjunctivae are normal. Pupils are equal, round, and reactive to light.  Neck: Normal range of motion. No JVD present. No thyromegaly present.  Cardiovascular: Normal rate, regular rhythm, normal heart sounds and intact distal pulses.  Exam reveals no gallop and no friction rub.   No murmur heard. Pulmonary/Chest: Effort normal and breath sounds  normal. No respiratory distress. He has no wheezes. He has no rales. He exhibits no tenderness.  Abdominal: Soft. Bowel sounds are normal. He exhibits no distension and no mass. There is no tenderness. There is no rebound and no guarding.  Musculoskeletal: Normal range of motion. He exhibits tenderness (R heel is tender). He exhibits no edema.  Lymphadenopathy:    He has no cervical adenopathy.  Neurological: He is alert and oriented to person, place, and time. He has normal reflexes. No cranial nerve deficit. He exhibits normal muscle tone. Coordination normal.  Skin: Skin is warm and dry. No rash noted.  Psychiatric: He has a normal mood and affect. His behavior is normal. Judgment and thought content normal.    Lab Results  Component Value Date   WBC 8.0 10/29/2010   HGB 14.5 10/29/2010   HCT 43.3 10/29/2010   PLT 231.0 10/29/2010   GLUCOSE 103* 09/06/2011   CHOL 174 06/26/2010   TRIG 215.0* 06/26/2010   HDL 41.60 06/26/2010   LDLDIRECT 96.0 06/26/2010   ALT 24 09/06/2011   AST 23 09/06/2011   NA 139 09/06/2011   K 5.3* 09/06/2011   CL 105 09/06/2011   CREATININE 1.1 09/06/2011   BUN 18 09/06/2011   CO2 27 09/06/2011   TSH 3.07 06/26/2010   PSA 2.97 06/26/2010         Assessment & Plan:

## 2011-09-17 NOTE — Assessment & Plan Note (Signed)
BP Readings from Last 3 Encounters:  09/17/11 160/92  02/08/11 130/90  11/30/10 130/78

## 2011-09-17 NOTE — Assessment & Plan Note (Signed)
5/13 ?lab error  Will repeat

## 2011-10-04 ENCOUNTER — Emergency Department (HOSPITAL_COMMUNITY): Payer: BC Managed Care – PPO

## 2011-10-04 ENCOUNTER — Inpatient Hospital Stay (HOSPITAL_COMMUNITY)
Admission: EM | Admit: 2011-10-04 | Discharge: 2011-10-05 | DRG: 228 | Disposition: A | Discharge status: Home / Self Care | Payer: BC Managed Care – PPO | Source: Ambulatory Visit | Attending: Orthopedic Surgery | Admitting: Orthopedic Surgery

## 2011-10-04 ENCOUNTER — Encounter (HOSPITAL_COMMUNITY)
Admission: EM | Disposition: A | Discharge status: Home / Self Care | Payer: Self-pay | Source: Ambulatory Visit | Attending: Orthopedic Surgery

## 2011-10-04 ENCOUNTER — Encounter (HOSPITAL_COMMUNITY): Payer: Self-pay | Admitting: Certified Registered"

## 2011-10-04 ENCOUNTER — Encounter (HOSPITAL_COMMUNITY): Payer: Self-pay

## 2011-10-04 ENCOUNTER — Observation Stay (HOSPITAL_COMMUNITY): Payer: BC Managed Care – PPO | Admitting: Certified Registered"

## 2011-10-04 DIAGNOSIS — S52599A Other fractures of lower end of unspecified radius, initial encounter for closed fracture: Principal | ICD-10-CM | POA: Diagnosis present

## 2011-10-04 DIAGNOSIS — E559 Vitamin D deficiency, unspecified: Secondary | ICD-10-CM | POA: Diagnosis present

## 2011-10-04 DIAGNOSIS — N189 Chronic kidney disease, unspecified: Secondary | ICD-10-CM | POA: Diagnosis present

## 2011-10-04 DIAGNOSIS — S62109A Fracture of unspecified carpal bone, unspecified wrist, initial encounter for closed fracture: Secondary | ICD-10-CM

## 2011-10-04 DIAGNOSIS — N529 Male erectile dysfunction, unspecified: Secondary | ICD-10-CM | POA: Diagnosis present

## 2011-10-04 DIAGNOSIS — W11XXXA Fall on and from ladder, initial encounter: Secondary | ICD-10-CM | POA: Diagnosis present

## 2011-10-04 DIAGNOSIS — S62009A Unspecified fracture of navicular [scaphoid] bone of unspecified wrist, initial encounter for closed fracture: Secondary | ICD-10-CM | POA: Diagnosis present

## 2011-10-04 DIAGNOSIS — I129 Hypertensive chronic kidney disease with stage 1 through stage 4 chronic kidney disease, or unspecified chronic kidney disease: Secondary | ICD-10-CM | POA: Diagnosis present

## 2011-10-04 DIAGNOSIS — E039 Hypothyroidism, unspecified: Secondary | ICD-10-CM | POA: Diagnosis present

## 2011-10-04 DIAGNOSIS — E538 Deficiency of other specified B group vitamins: Secondary | ICD-10-CM | POA: Diagnosis present

## 2011-10-04 DIAGNOSIS — E785 Hyperlipidemia, unspecified: Secondary | ICD-10-CM | POA: Diagnosis present

## 2011-10-04 DIAGNOSIS — K5900 Constipation, unspecified: Secondary | ICD-10-CM | POA: Diagnosis present

## 2011-10-04 DIAGNOSIS — E291 Testicular hypofunction: Secondary | ICD-10-CM | POA: Diagnosis present

## 2011-10-04 HISTORY — PX: OTHER SURGICAL HISTORY: SHX169

## 2011-10-04 LAB — DIFFERENTIAL
Basophils Absolute: 0 10*3/uL (ref 0.0–0.1)
Basophils Absolute: 0 10*3/uL (ref 0.0–0.1)
Basophils Relative: 0 % (ref 0–1)
Basophils Relative: 0 % (ref 0–1)
Eosinophils Absolute: 0 10*3/uL (ref 0.0–0.7)
Monocytes Absolute: 1 10*3/uL (ref 0.1–1.0)
Monocytes Absolute: 0.9 10*3/uL (ref 0.1–1.0)
Monocytes Relative: 7 % (ref 3–12)
Neutro Abs: 15.6 10*3/uL — ABNORMAL HIGH (ref 1.7–7.7)
Neutro Abs: 12.2 10*3/uL — ABNORMAL HIGH (ref 1.7–7.7)
Neutrophils Relative %: 88 % — ABNORMAL HIGH (ref 43–77)

## 2011-10-04 LAB — BASIC METABOLIC PANEL
BUN: 19 mg/dL (ref 6–23)
Calcium: 9 mg/dL (ref 8.4–10.5)
Calcium: 8.6 mg/dL (ref 8.4–10.5)
Chloride: 106 mEq/L (ref 96–112)
Creatinine, Ser: 1.41 mg/dL — ABNORMAL HIGH (ref 0.50–1.35)
GFR calc Af Amer: 59 mL/min — ABNORMAL LOW (ref >90)
GFR calc Af Amer: 57 mL/min — ABNORMAL LOW (ref >90)
GFR calc non Af Amer: 49 mL/min — ABNORMAL LOW (ref >90)
Potassium: 5.3 mEq/L — ABNORMAL HIGH (ref 3.5–5.1)
Sodium: 138 mEq/L (ref 135–145)

## 2011-10-04 LAB — CBC
HCT: 44.7 % (ref 39.0–52.0)
HCT: 42.5 % (ref 39.0–52.0)
Hemoglobin: 13.8 g/dL (ref 13.0–17.0)
MCH: 29.5 pg (ref 26.0–34.0)
MCHC: 32.9 g/dL (ref 30.0–36.0)
MCHC: 32.5 g/dL (ref 30.0–36.0)
RDW: 15.8 % — ABNORMAL HIGH (ref 11.5–15.5)
RDW: 16.1 % — ABNORMAL HIGH (ref 11.5–15.5)

## 2011-10-04 LAB — APTT: aPTT: 28 seconds (ref 24–37)

## 2011-10-04 SURGERY — OPEN REDUCTION INTERNAL FIXATION (ORIF) SCAPHOID WITH DISTAL RADIUS GRAFT
Anesthesia: General | Site: Wrist | Laterality: Left | Wound class: Clean

## 2011-10-04 MED ORDER — MIDAZOLAM HCL 5 MG/5ML IJ SOLN
INTRAMUSCULAR | Status: DC | PRN
  Administered 2011-10-04: 2 mg via INTRAVENOUS

## 2011-10-04 MED ORDER — ROCURONIUM BROMIDE 100 MG/10ML IV SOLN
INTRAVENOUS | Status: DC | PRN
  Administered 2011-10-04: 30 mg via INTRAVENOUS
  Administered 2011-10-04: 20 mg via INTRAVENOUS

## 2011-10-04 MED ORDER — ONDANSETRON HCL 4 MG/2ML IJ SOLN: 4.0000 mg | Freq: Four times a day (QID) | INTRAMUSCULAR | Status: DC | PRN

## 2011-10-04 MED ORDER — CEFAZOLIN SODIUM 1-5 GM-% IV SOLN
INTRAVENOUS | Status: AC
  Filled 2011-10-04: qty 100

## 2011-10-04 MED ORDER — DOCUSATE SODIUM 100 MG PO CAPS
100.0000 mg | ORAL_CAPSULE | Freq: Two times a day (BID) | ORAL | Status: DC
  Administered 2011-10-05: 100 mg via ORAL
  Filled 2011-10-04 (×2): qty 1

## 2011-10-04 MED ORDER — NEOSTIGMINE METHYLSULFATE 1 MG/ML IJ SOLN
INTRAMUSCULAR | Status: DC | PRN
  Administered 2011-10-04: 4 mg via INTRAVENOUS

## 2011-10-04 MED ORDER — VITAMIN C 500 MG PO TABS
1000.0000 mg | ORAL_TABLET | Freq: Every day | ORAL | Status: DC
  Administered 2011-10-05: 1000 mg via ORAL
  Filled 2011-10-04: qty 2

## 2011-10-04 MED ORDER — FAMOTIDINE 20 MG PO TABS
20.0000 mg | ORAL_TABLET | Freq: Two times a day (BID) | ORAL | Status: DC | PRN
  Filled 2011-10-04: qty 1

## 2011-10-04 MED ORDER — CEFAZOLIN SODIUM 1-5 GM-% IV SOLN
INTRAVENOUS | Status: DC | PRN
  Administered 2011-10-04: 2 g via INTRAVENOUS

## 2011-10-04 MED ORDER — SUCCINYLCHOLINE CHLORIDE 20 MG/ML IJ SOLN
INTRAMUSCULAR | Status: DC | PRN
  Administered 2011-10-04: 120 mg via INTRAVENOUS

## 2011-10-04 MED ORDER — LIDOCAINE HCL (CARDIAC) 20 MG/ML IV SOLN
INTRAVENOUS | Status: DC | PRN
  Administered 2011-10-04: 60 mg via INTRAVENOUS

## 2011-10-04 MED ORDER — ONDANSETRON HCL 4 MG PO TABS: 4.0000 mg | ORAL_TABLET | Freq: Four times a day (QID) | ORAL | Status: DC | PRN

## 2011-10-04 MED ORDER — OXYCODONE HCL 5 MG PO TABS
5.0000 mg | ORAL_TABLET | ORAL | Status: DC | PRN
  Administered 2011-10-05 (×2): 10 mg via ORAL
  Administered 2011-10-05: 5 mg via ORAL
  Filled 2011-10-04: qty 1
  Filled 2011-10-04 (×2): qty 2

## 2011-10-04 MED ORDER — HYDROMORPHONE HCL PF 1 MG/ML IJ SOLN: 0.2500 mg | INTRAMUSCULAR | Status: DC | PRN

## 2011-10-04 MED ORDER — HYDROMORPHONE HCL PF 1 MG/ML IJ SOLN
1.0000 mg | Freq: Once | INTRAMUSCULAR | Status: AC
  Administered 2011-10-04: 1 mg via INTRAVENOUS
  Filled 2011-10-04: qty 1

## 2011-10-04 MED ORDER — GLYCOPYRROLATE 0.2 MG/ML IJ SOLN
INTRAMUSCULAR | Status: DC | PRN
  Administered 2011-10-04: 0.6 mg via INTRAVENOUS

## 2011-10-04 MED ORDER — ONDANSETRON HCL 4 MG/2ML IJ SOLN: 4.0000 mg | Freq: Once | INTRAMUSCULAR | Status: DC | PRN

## 2011-10-04 MED ORDER — LACTATED RINGERS IV SOLN
INTRAVENOUS | Status: DC | PRN
  Administered 2011-10-04 (×3): via INTRAVENOUS

## 2011-10-04 MED ORDER — PROPOFOL 10 MG/ML IV EMUL
INTRAVENOUS | Status: DC | PRN
  Administered 2011-10-04: 200 mg via INTRAVENOUS

## 2011-10-04 MED ORDER — CEFAZOLIN SODIUM 1-5 GM-% IV SOLN
1.0000 g | INTRAVENOUS | Status: AC
  Administered 2011-10-04: 1 g via INTRAVENOUS
  Filled 2011-10-04: qty 50

## 2011-10-04 MED ORDER — METHOCARBAMOL 100 MG/ML IJ SOLN: 500.0000 mg | Freq: Four times a day (QID) | INTRAVENOUS | Status: DC | PRN

## 2011-10-04 MED ORDER — ONDANSETRON HCL 4 MG/2ML IJ SOLN
INTRAMUSCULAR | Status: DC | PRN
  Administered 2011-10-04: 4 mg via INTRAVENOUS

## 2011-10-04 MED ORDER — FENTANYL CITRATE 0.05 MG/ML IJ SOLN
INTRAMUSCULAR | Status: DC | PRN
  Administered 2011-10-04: 50 ug via INTRAVENOUS
  Administered 2011-10-04: 100 ug via INTRAVENOUS
  Administered 2011-10-04: 25 ug via INTRAVENOUS

## 2011-10-04 MED ORDER — CEFAZOLIN SODIUM 1-5 GM-% IV SOLN
1.0000 g | Freq: Three times a day (TID) | INTRAVENOUS | Status: DC
  Administered 2011-10-05 (×2): 1 g via INTRAVENOUS
  Filled 2011-10-04 (×4): qty 50

## 2011-10-04 MED ORDER — METHOCARBAMOL 500 MG PO TABS
500.0000 mg | ORAL_TABLET | Freq: Four times a day (QID) | ORAL | Status: DC | PRN
  Administered 2011-10-05: 500 mg via ORAL
  Filled 2011-10-04: qty 1

## 2011-10-04 MED ORDER — PHENYLEPHRINE HCL 10 MG/ML IJ SOLN
INTRAMUSCULAR | Status: DC | PRN
  Administered 2011-10-04 (×2): 40 ug via INTRAVENOUS

## 2011-10-04 MED ORDER — BUPIVACAINE-EPINEPHRINE PF 0.5-1:200000 % IJ SOLN
INTRAMUSCULAR | Status: DC | PRN
  Administered 2011-10-04: 25 mL

## 2011-10-04 MED ORDER — ALPRAZOLAM 0.5 MG PO TABS: 0.5000 mg | ORAL_TABLET | Freq: Four times a day (QID) | ORAL | Status: DC | PRN

## 2011-10-04 MED ORDER — ONDANSETRON HCL 4 MG/2ML IJ SOLN
4.0000 mg | Freq: Once | INTRAMUSCULAR | Status: AC
  Administered 2011-10-04: 4 mg via INTRAVENOUS
  Filled 2011-10-04: qty 2

## 2011-10-04 MED ORDER — MORPHINE SULFATE 2 MG/ML IJ SOLN
1.0000 mg | INTRAMUSCULAR | Status: DC | PRN
  Administered 2011-10-05: 1 mg via INTRAVENOUS
  Filled 2011-10-04: qty 1

## 2011-10-04 MED ORDER — 0.9 % SODIUM CHLORIDE (POUR BTL) OPTIME
TOPICAL | Status: DC | PRN
  Administered 2011-10-04: 1000 mL

## 2011-10-04 MED ORDER — PROMETHAZINE HCL 25 MG RE SUPP: 12.5000 mg | Freq: Four times a day (QID) | RECTAL | Status: DC | PRN

## 2011-10-04 SURGICAL SUPPLY — 78 items
BANDAGE ELASTIC 3 VELCRO ST LF (GAUZE/BANDAGES/DRESSINGS) ×1 IMPLANT
BANDAGE ELASTIC 4 VELCRO ST LF (GAUZE/BANDAGES/DRESSINGS) ×1 IMPLANT
BANDAGE GAUZE ELAST BULKY 4 IN (GAUZE/BANDAGES/DRESSINGS) ×1 IMPLANT
BIT DRILL 2 FAST STEP (BIT) ×1 IMPLANT
BIT DRILL 2.5X4 QC (BIT) ×1 IMPLANT
BIT DRILL MINI LNG ACUTRAK 2 (BIT) IMPLANT
BLADE SURG ROTATE 9660 (MISCELLANEOUS) IMPLANT
BNDG CMPR 9X4 STRL LF SNTH (GAUZE/BANDAGES/DRESSINGS)
BNDG ESMARK 4X9 LF (GAUZE/BANDAGES/DRESSINGS) ×1 IMPLANT
CLOTH BEACON ORANGE TIMEOUT ST (SAFETY) ×1 IMPLANT
CORDS BIPOLAR (ELECTRODE) ×1 IMPLANT
COVER SURGICAL LIGHT HANDLE (MISCELLANEOUS) ×2 IMPLANT
CUFF TOURNIQUET SINGLE 18IN (TOURNIQUET CUFF) ×2 IMPLANT
CUFF TOURNIQUET SINGLE 24IN (TOURNIQUET CUFF) IMPLANT
DECANTER SPIKE VIAL GLASS SM (MISCELLANEOUS) IMPLANT
DRAIN TLS ROUND 10FR (DRAIN) IMPLANT
DRAPE INCISE IOBAN 66X45 STRL (DRAPES) IMPLANT
DRAPE OEC MINIVIEW 54X84 (DRAPES) ×1 IMPLANT
DRAPE U-SHAPE 47X51 STRL (DRAPES) ×2 IMPLANT
DRILL MINI LNG ACUTRAK 2 (BIT) ×2
DRSG ADAPTIC 3X8 NADH LF (GAUZE/BANDAGES/DRESSINGS) ×1 IMPLANT
ELECT REM PT RETURN 9FT ADLT (ELECTROSURGICAL) ×2
ELECTRODE REM PT RTRN 9FT ADLT (ELECTROSURGICAL) IMPLANT
GAUZE XEROFORM 1X8 LF (GAUZE/BANDAGES/DRESSINGS) ×1 IMPLANT
GAUZE XEROFORM 5X9 LF (GAUZE/BANDAGES/DRESSINGS) ×1 IMPLANT
GLOVE BIOGEL PI ORTHO PRO SZ7 (GLOVE) ×3
GLOVE ORTHO TXT STRL SZ7.5 (GLOVE) ×1 IMPLANT
GLOVE PI ORTHO PRO STRL SZ7 (GLOVE) IMPLANT
GLOVE SS BIOGEL STRL SZ 8 (GLOVE) ×1 IMPLANT
GLOVE SUPERSENSE BIOGEL SZ 8 (GLOVE) ×1
GLOVE SURG SS PI 6.5 STRL IVOR (GLOVE) ×1 IMPLANT
GLOVE SURG SS PI 7.0 STRL IVOR (GLOVE) ×1 IMPLANT
GOWN PREVENTION PLUS XLARGE (GOWN DISPOSABLE) ×1 IMPLANT
GOWN STRL NON-REIN LRG LVL3 (GOWN DISPOSABLE) ×3 IMPLANT
GOWN STRL REIN XL XLG (GOWN DISPOSABLE) ×2 IMPLANT
KIT BASIN OR (CUSTOM PROCEDURE TRAY) ×2 IMPLANT
KIT ROOM TURNOVER OR (KITS) ×2 IMPLANT
LOOP VESSEL MAXI BLUE (MISCELLANEOUS) ×2 IMPLANT
MANIFOLD NEPTUNE II (INSTRUMENTS) ×1 IMPLANT
NDL BLUNT 16X1.5 OR ONLY (NEEDLE) IMPLANT
NEEDLE 22X1 1/2 (OR ONLY) (NEEDLE) ×1 IMPLANT
NEEDLE BLUNT 16X1.5 OR ONLY (NEEDLE) IMPLANT
NS IRRIG 1000ML POUR BTL (IV SOLUTION) ×2 IMPLANT
PACK ORTHO EXTREMITY (CUSTOM PROCEDURE TRAY) ×2 IMPLANT
PAD ARMBOARD 7.5X6 YLW CONV (MISCELLANEOUS) ×3 IMPLANT
PAD CAST 3X4 CTTN HI CHSV (CAST SUPPLIES) IMPLANT
PAD CAST 4YDX4 CTTN HI CHSV (CAST SUPPLIES) ×1 IMPLANT
PADDING CAST COTTON 3X4 STRL (CAST SUPPLIES) ×2
PADDING CAST COTTON 4X4 STRL (CAST SUPPLIES)
PEG SUBCHONDRAL SMOOTH 2.0X20 (Peg) ×1 IMPLANT
PEG SUBCHONDRAL SMOOTH 2.0X22 (Peg) ×3 IMPLANT
PEG SUBCHONDRAL SMOOTH 2.0X24 (Peg) ×2 IMPLANT
PEG SUBCHONDRAL SMOOTH 2.0X26 (Peg) ×1 IMPLANT
PLATE STAN 24.4X59.5 LT (Plate) ×1 IMPLANT
PUTTY ORTHOBLAST II 5CC (Orthopedic Implant) ×1 IMPLANT
SCREW BN 13X3.5XNS CORT TI (Screw) IMPLANT
SCREW CORT 3.5X13 (Screw) ×2 IMPLANT
SCREW CORT 3.5X14 LNG (Screw) ×1 IMPLANT
SCREW CORT 3.5X15 (Screw) ×2 IMPLANT
SCREW CORT 3.5X16 LNG (Screw) ×1 IMPLANT
SCREW CORTICAL 3.5MM 24MM (Screw) ×1 IMPLANT
SCREW MINI ACTRK 22MM (Screw) ×1 IMPLANT
SPLINT FIBERGLASS 3X35 (CAST SUPPLIES) ×2 IMPLANT
SPONGE GAUZE 4X4 12PLY (GAUZE/BANDAGES/DRESSINGS) ×1 IMPLANT
SPONGE LAP 4X18 X RAY DECT (DISPOSABLE) ×2 IMPLANT
STAPLER VISISTAT 35W (STAPLE) IMPLANT
SUCTION FRAZIER TIP 10 FR DISP (SUCTIONS) ×2 IMPLANT
SUT ETHILON 4 0 PS 2 18 (SUTURE) ×1 IMPLANT
SUT PROLENE 4 0 PS 2 18 (SUTURE) ×2 IMPLANT
SUT VIC AB 3-0 FS2 27 (SUTURE) ×2 IMPLANT
SYR CONTROL 10ML LL (SYRINGE) IMPLANT
SYSTEM CHEST DRAIN TLS 7FR (DRAIN) ×1 IMPLANT
TOWEL OR 17X24 6PK STRL BLUE (TOWEL DISPOSABLE) ×1 IMPLANT
TOWEL OR 17X26 10 PK STRL BLUE (TOWEL DISPOSABLE) ×1 IMPLANT
TUBE CONNECTING 12X1/4 (SUCTIONS) ×2 IMPLANT
TUBE EVACUATION TLS (MISCELLANEOUS) ×1 IMPLANT
WATER STERILE IRR 1000ML POUR (IV SOLUTION) ×1 IMPLANT
YANKAUER SUCT BULB TIP NO VENT (SUCTIONS) IMPLANT

## 2011-10-04 NOTE — Op Note (Signed)
NAMESHADEED, COLBERG NO.:  [id number]  MEDICAL RECORD NO.:  [id number]  LOCATION:  [facility]                         FACILITY:  [facility]  PHYSICIAN:  Dionne Ano. Arshdeep Bolger, M.D.DATE OF BIRTH:  11-Sep-1945  DATE OF PROCEDURE: DATE OF DISCHARGE:                              OPERATIVE REPORT   PREOPERATIVE DIAGNOSES: 1. Comminuted complex greater than 5-part intra-articular distal     radius fracture. 2. Left displaced scaphoid fracture with comminution.  POSTOPERATIVE DIAGNOSES: 1. Comminuted complex greater than 5-part intra-articular distal     radius fracture. 2. Left displaced scaphoid fracture with comminution.  PROCEDURE: 1. Open reduction and internal fixation of the left distal radius     fracture, greater than 5 parts, comminuted in nature in the intra-     articular.  We used a DVR plate and screw construct as well as     allograft bone grafting (osteoblast). 2. Open reduction and internal fixation of the left mid-waist scaphoid     fracture with autologous and allograft bone graft. 3. Stress radiography. 4. Brachioradialis tenotomy.  SURGEON:  Dionne Ano. Amanda Pea, M.D.  ASSISTANT:  None.  COMPLICATIONS:  None.  ANESTHESIA:  General preoperative block placed by Dr. Sheldon Silvan.  TOURNIQUET TIME:  Less than 2 hours.  INDICATIONS FOR PROCEDURE:  The patient is a pleasant male who is 66 years of age.  He was on a ladder, fell approximately 10 feet or so, landing on his left upper extremity with a very comminuted fracture of his wrist.  He denies elbow or shoulder pain.  He denies chest, back, or abdominal pain.  He does have some left posterior lateral hip pain, but nothing is growing and confirmed with straight leg raise.  I have counseled the patient in regard to risks, benefits, surgery including risk of infection, bleeding, anesthesia, damage to normal structures, and failure of surgery to accomplish its intended goals of relieving symptoms and  restoring function.  With this in mind, he desires to proceed.  All questions have been encouraged and answered preoperatively.  OPERATIVE PROCEDURE:  The patient was seen by myself and Anesthesia, taken to operative suite, underwent smooth induction of general anesthesia, time-out called, pre and postop check list was complete.  He was then prepped and draped in a sterile fashion by myself with longitudinal traction placed about the arm.  Once sterile prep and drape was accomplished and sterile field was secured, I then elevated tourniquet and performed a volar approach to the distal radius. Fasciotomy was accomplished about the superficial and deep fascia.  FCR tendon sheath incised. Radial artery protected. FCR was then retracted as was carpal canal contents ulnarly.  Following this, dissection was carried down.  Pronator was incised and elevated and access to the fracture site was accomplished.  I then placed 7.5-8 mL of osteoblast bone graft into a dorsal V defect.  Following this, I placed a lobster claw and then placed a lag screw across a longitudinal split in the fracture.  Following this, I then placed a DVR plate and screw construct in a standard fashion.  I used AO technique, there were no complications, and all looked quite well.  I  very carefully placed the smooth pegs and I was able to achieve radial height, inclination, and volar tilt to my satisfaction.  It looked quite well.  The patient did very well with the restoration of alignment and I was pleased with this. I performed a sliding brachioradialis tenotomy to allow for reduction.  Following this, the patient then underwent live fluoro, he had good stability about the distal radius and of course the midshaft scaphoid fracture.  There was some comminution on the scaphoid fracture and this was acute.  Thus, at this time, I made extension of the incision, dissected down and accessed the scaphoid.  I was very careful  to preserve vascularity and tissue.  I opened up the volar capsular floor and then identified the scaphoid fracture.  The fracture was irrigated, reduced, and then I placed from distal to proximal guidewire for a mini-Acutrak screw.  He measured a 26 and I put in a 22 mm screw.  This allowed for excellent compression across the fracture site.  I did place a allografting in combination autograph bone graft from the trapezium obtained volarly into the area. X-rays, AP, lateral, and oblique looked excellent.  I was pleased with the findings. Thus, ORIF of the scaphoid and radius with auto and allograft bone grafting as well as stress radiography and brachioradialis tenotomy was accomplished.  Live fluoro showed excellent position of all hardware and I was quite pleased with this.  Following this, I irrigated it copiously with the tourniquet deflated. Closed the pronator with Vicryl and following this, the subcu was closed with Vicryl.  TLS size 7 drain was placed and the skin edge was closed with Prolene.  He was placed in a sugar-tong thumb spica splint.  There were no complicating features.  All sponge, needles, instruments counts were correct.  The patient will be monitored in recovery room, admitted overnight for IV antibiotics, general postop observatory care.  We will watch his left hip and his general parameters postop very closely.  Should any problems occur, he will notify us.  Otherwise, we will look forward to participating in his care.     Dionne Ano. Amanda Pea, M.D.     [facility]  D:  10/04/2011  T:  10/04/2011  Job:  [id number]

## 2011-10-04 NOTE — Transfer of Care (Signed)
Immediate Anesthesia Transfer of Care Note  Patient: Donald Phillips.  Procedure(s) Performed: Procedure(s) (LRB): OPEN REDUCTION INTERNAL FIXATION (ORIF) WRIST FRACTURE (Left)  Patient Location: PACU  Anesthesia Type: General  Level of Consciousness: awake and alert   Airway & Oxygen Therapy: Patient Spontanous Breathing  Post-op Assessment: Report given to PACU RN  Post vital signs: stable  Complications: No apparent anesthesia complications

## 2011-10-04 NOTE — ED Notes (Signed)
Pt taken to OR, belongings with wife. Consent obtained

## 2011-10-04 NOTE — ED Notes (Signed)
Patient transported to X-ray 

## 2011-10-04 NOTE — H&P (Signed)
Donald Phillips. is an 66 y.o. male.   Chief Complaint: Left wrist and forearm pain, left posterior lateral hip pain , status post fall off a ladder HPI: Patient is a 66 year old male who fell off of a ladder today. He states he was about to step onto the roof from the ladder fell. I was [place] him urgently by the emergency room physicians. I was asked to see and treat him in regards to his left upper extremity predicament. He has a comminuted complex left wrist fracture with associated scaphoid fracture age undetermined but likely fresh. The patient notes no nausea vomiting fever chills. The patient notes no abdominal pain chest pain back pain or neck pain. He denies knee or ankle pain.  Past Medical History  Diagnosis Date  . Hyperlipidemia   . Hypertension   . Asthma     remote hx  . Allergic rhinitis   . Impotence of organic origin   . Other testicular hypofunction   . Unspecified hypothyroidism   . Diverticulosis of colon   . Chronic kidney disease     stones  . Hemorrhoid     Past Surgical History  Procedure Date  . Hemorrhoid surgery   . Inguinal hernia repair     Family History  Problem Relation Age of Onset  . Cancer      prostate  . Dementia Mother   . Cancer Father     prostate  . Heart disease Father     heart attack  . Stroke Father   . Cancer Sister    Social History:  reports that he has quit smoking. He does not have any smokeless tobacco history on file. He reports that he does not drink alcohol or use illicit drugs.  Allergies:  Allergies  Allergen Reactions  . Tadalafil     REACTION: GERD x 3 d     (Not in a hospital admission)  Results for orders placed during the hospital encounter of 10/04/11 (from the past 48 hour(s))  CBC     Status: Abnormal   Collection Time   10/04/11  4:03 PM      Component Value Range Comment   WBC 17.8 (*) 4.0 - 10.5 (K/uL)    RBC 5.00  4.22 - 5.81 (MIL/uL)    Hemoglobin 14.7  13.0 - 17.0 (g/dL)    HCT 16.1  09.6 -  04.5 (%)    MCV 89.4  78.0 - 100.0 (fL)    MCH 29.4  26.0 - 34.0 (pg)    MCHC 32.9  30.0 - 36.0 (g/dL)    RDW 40.9 (*) 81.1 - 15.5 (%)    Platelets 278  150 - 400 (K/uL)   DIFFERENTIAL     Status: Abnormal   Collection Time   10/04/11  4:03 PM      Component Value Range Comment   Neutrophils Relative 88 (*) 43 - 77 (%)    Neutro Abs 15.6 (*) 1.7 - 7.7 (K/uL)    Lymphocytes Relative 6 (*) 12 - 46 (%)    Lymphs Abs 1.1  0.7 - 4.0 (K/uL)    Monocytes Relative 6  3 - 12 (%)    Monocytes Absolute 1.0  0.1 - 1.0 (K/uL)    Eosinophils Relative 0  0 - 5 (%)    Eosinophils Absolute 0.0  0.0 - 0.7 (K/uL)    Basophils Relative 0  0 - 1 (%)    Basophils Absolute 0.0  0.0 - 0.1 (K/uL)  Dg Chest 2 View  10/04/2011  *RADIOLOGY REPORT*  Clinical Data: Left wrist fracture.  Hypertension and asthma. Preop respiratory exam.  CHEST - 2 VIEW  Comparison: 10/27/2010  Findings: Mild scarring again seen in the left lung base.  Lungs otherwise clear.  No evidence of pleural effusion.  Heart size is within normal limits.  IMPRESSION: Stable mild left basilar scarring.  No active disease.  Original Report Authenticated By: Danae Orleans, M.D.   Dg Wrist Complete Left  10/04/2011  *RADIOLOGY REPORT*  Clinical Data: Fall from ladder  LEFT WRIST - COMPLETE 3+ VIEW  Comparison: None.  Findings: Markedly comminuted intra-articular fracture of the distal radius is present.  There is foreshortening of the radius and impaction of the carpal row into the location of the distal radius.  Comminuted fracture of the scaphoid with some displacement.  There is marked dorsal angulation and displacement of the distal radius articular surface.  Tiny bony densities are seen adjacent to the dorsal lunate and small bony fragments from the lunate may be present. Ulnar styloid fracture is suspected.  IMPRESSION: Multiple fractures including the distal radius, scaphoid and probably the lunate are noted.  Original Report Authenticated By:  Donavan Burnet, M.D.   Dg Hip Complete Left  10/04/2011  *RADIOLOGY REPORT*  Clinical Data: Fall.  LEFT HIP - COMPLETE 2+ VIEW  Comparison: None.  Findings: No acute fracture and no dislocation.  IMPRESSION: No acute bony pathology.  Original Report Authenticated By: Donavan Burnet, M.D.    Review of Systems  Constitutional: Negative.   HENT: Negative.   Eyes: Negative.   Respiratory: Negative.   Cardiovascular: Negative.   Gastrointestinal: Negative.   Genitourinary: Negative.   Musculoskeletal:       Please see history and physical  Skin: Negative.   Neurological: Negative.   Endo/Heme/Allergies: Negative.   Psychiatric/Behavioral: Negative.     Blood pressure 138/65, pulse 76, temperature 98.7 F (37.1 C), temperature source Oral, resp. rate 17, SpO2 98.00%. Physical Exam patient has a comminuted left wrist fracture closed in nature. He has intact sensation to the index middle ring and small finger. He has a trace movement about the thumb but is painful. The patient has normal refill. He has no evidence of obvious compartment syndrome.  HEENT is within normal limits. Patient's abdomen is slightly obese and nontender chest is clear heart is regular rate without murmur. The patient has no neck or spine pain. His left posterior lateral hip is tender over the greater trochanter but he does not have groin pain and can perform a straight leg raise without difficulty about both the right and left lower extremities.  He has normal pulses to the lower extremity he has no evidence of DVT. He has no evidence of compartment syndrome and lower extremity.  His most pressing issue is of course the left wrist comminuted fracture. I discussed with him the issues and plans for surgical intervention.  Assessment/Plan .Marland KitchenWe are planning surgery for your upper extremity. The risk and benefits of surgery include risk of bleeding infection anesthesia damage to normal structures and failure of the surgery  to accomplish its intended goals of relieving symptoms and restoring function with this in mind we'll going to proceed. I have specifically discussed with the patient the pre-and postoperative regime and the does and don'ts and risk and benefits in great detail. Risk and benefits of surgery also include risk of dystrophy chronic nerve pain failure of the healing process to go onto  completion and other inherent risks of surgery The relavent the pathophysiology of the disease/injury process, as well as the alternatives for treatment and postoperative course of action has been discussed in great detail with the patient who desires to proceed.  We will do everything in our power to help you (the patient) restore function to the upper extremity. Is a pleasure to see this patient today.   The patient specifically understands risk and benefits risk of arthritis nonunion malunion painful motion and other sequelae given the significance of his injury that he demonstrates about the x-ray and clinical exam.  We'll proceed as soon as possible to the operative theater.  EUSEVIO, SCHRIVER 10/04/2011, 4:57 PM

## 2011-10-04 NOTE — Anesthesia Postprocedure Evaluation (Signed)
  Anesthesia Post-op Note  Patient: Donald Phillips.  Procedure(s) Performed: Procedure(s) (LRB): OPEN REDUCTION INTERNAL FIXATION (ORIF) SCAPHOID WITH DISTAL RADIUS GRAFT (Left)  Patient Location: PACU  Anesthesia Type: GA combined with regional for post-op pain  Level of Consciousness: awake, alert  and oriented  Airway and Oxygen Therapy: Patient Spontanous Breathing and Patient connected to nasal cannula oxygen  Post-op Pain: mild  Post-op Assessment: Post-op Vital signs reviewed  Post-op Vital Signs: Reviewed  Complications: No apparent anesthesia complications

## 2011-10-04 NOTE — ED Notes (Signed)
Hand surgeon at bedside. 

## 2011-10-04 NOTE — ED Provider Notes (Signed)
History     CSN: [id number]  Arrival date & time 10/04/11  1336   First MD Initiated Contact with Patient 10/04/11 1358      Chief Complaint  Patient presents with  . Fall    (Consider location/radiation/quality/duration/timing/severity/associated sxs/prior treatment) HPI Pt reports he was on a ladder, about 8-9 feet high, when the ladder slid out from under him and he fell onto a wooden deck landing on his L side on top of the ladder. He denies any head injury, no LOC and no neck pain. He is complaining of moderate to severe L wrist and hip pain, worse with movement.     Past Medical History  Diagnosis Date  . Hyperlipidemia   . Hypertension   . Asthma     remote hx  . Allergic rhinitis   . Impotence of organic origin   . Other testicular hypofunction   . Unspecified hypothyroidism   . Diverticulosis of colon   . Chronic kidney disease     stones  . Hemorrhoid     Past Surgical History  Procedure Date  . Hemorrhoid surgery   . Inguinal hernia repair     Family History  Problem Relation Age of Onset  . Cancer      prostate  . Dementia Mother   . Cancer Father     prostate  . Heart disease Father     heart attack  . Stroke Father   . Cancer Sister     History  Substance Use Topics  . Smoking status: Former Games developer  . Smokeless tobacco: Not on file  . Alcohol Use: No      Review of Systems All other systems reviewed and are negative except as noted in HPI.   Allergies  Tadalafil  Home Medications   Current Outpatient Rx  Name Route Sig Dispense Refill  . VITAMIN D3 1000 UNITS PO TABS Oral Take 1,000 Units by mouth daily.      Marland Kitchen VITAMIN B-12 1000 MCG SL SUBL Sublingual Place 1 tablet (1,000 mcg total) under the tongue daily. 100 tablet 3  . ENALAPRIL MALEATE 10 MG PO TABS  TAKE ONE TABLET BY MOUTH EVERY DAY FOR BLOOD PRESSURE 90 tablet 1  . FEXOFENADINE-PSEUDOEPHED ER 60-120 MG PO TB12 Oral Take 1 tablet by mouth 2 (two) times daily as needed.       Marland Kitchen LEVOTHYROXINE SODIUM 112 MCG PO TABS  TAKE ONE TABLET BY MOUTH EVERY DAY 30 tablet 11  . ROSUVASTATIN CALCIUM 20 MG PO TABS Oral Take 1 tablet (20 mg total) by mouth daily. 30 tablet 11  . TESTOSTERONE 20.25 MG/ACT (1.62%) TD GEL Transdermal Place 4 Act onto the skin every morning. 150 g 5  . VIAGRA 100 MG PO TABS  TAKE ONE TABLET BY MOUTH EVERY DAY AS NEEDED 12 each 6    BP 140/74  Pulse 75  Temp(Src) 97.6 F (36.4 C) (Oral)  Resp 18  SpO2 96%  Physical Exam  Nursing note and vitals reviewed. Constitutional: He is oriented to person, place, and time. He appears well-developed and well-nourished.  HENT:  Head: Normocephalic and atraumatic.  Eyes: EOM are normal. Pupils are equal, round, and reactive to light.  Neck:       Immobilized in C-collar, cleared by Nexus criteria  Cardiovascular: Normal rate, normal heart sounds and intact distal pulses.   Pulmonary/Chest: Effort normal and breath sounds normal.  Abdominal: Bowel sounds are normal. He exhibits no distension. There is no tenderness.  Musculoskeletal: He exhibits tenderness. He exhibits no edema.       Tender over the L postero-lateral hip, worse with movement. L wrist deformity. Pain with palpation, neurovascularly intact   Neurological: He is alert and oriented to person, place, and time. He has normal strength. No cranial nerve deficit or sensory deficit.  Skin: Skin is warm and dry. No rash noted.  Psychiatric: He has a normal mood and affect.    ED Course  Procedures (including critical care time)  Labs Reviewed - No data to display Dg Wrist Complete Left  10/04/2011  *RADIOLOGY REPORT*  Clinical Data: Fall from ladder  LEFT WRIST - COMPLETE 3+ VIEW  Comparison: None.  Findings: Markedly comminuted intra-articular fracture of the distal radius is present.  There is foreshortening of the radius and impaction of the carpal row into the location of the distal radius.  Comminuted fracture of the scaphoid with some  displacement.  There is marked dorsal angulation and displacement of the distal radius articular surface.  Tiny bony densities are seen adjacent to the dorsal lunate and small bony fragments from the lunate may be present. Ulnar styloid fracture is suspected.  IMPRESSION: Multiple fractures including the distal radius, scaphoid and probably the lunate are noted.  Original Report Authenticated By: Donavan Burnet, M.D.   Dg Hip Complete Left  10/04/2011  *RADIOLOGY REPORT*  Clinical Data: Fall.  LEFT HIP - COMPLETE 2+ VIEW  Comparison: None.  Findings: No acute fracture and no dislocation.  IMPRESSION: No acute bony pathology.  Original Report Authenticated By: Donavan Burnet, M.D.     No diagnosis found.    MDM  Pt initially refused pain medication. Discussed xray results with the patient including likely need for surgical intervention and he would like to have some pain medication now. Will discuss with hand surgeon on call.   3:41 PM Dr. Amanda Pea to see in the ED. Pre-op labs, EKG and CXR ordered.       Tynika Luddy B. Bernette Mayers, MD 10/04/11 [phone]

## 2011-10-04 NOTE — Op Note (Signed)
Dict # [id number] Donald Severin MD

## 2011-10-04 NOTE — ED Notes (Signed)
Pt here for fall from roof of house sts approx 8-9 feet onto wooden deck, complains of left hip pain and left arm deformity at wrist, in ked and splint to left wrist. Pelvis stable per ems, pt was lethargic initially and diaphoretic but was working in sweat pants and sweat shirt.

## 2011-10-04 NOTE — Anesthesia Procedure Notes (Addendum)
Anesthesia Regional Block:  Supraclavicular block  Pre-Anesthetic Checklist: ,, timeout performed, Correct Patient, Correct Site, Correct Laterality, Correct Procedure, Correct Position, site marked, Risks and benefits discussed,  Surgical consent,  Pre-op evaluation,  At surgeon's request and post-op pain management  Laterality: Left and Upper  Prep: chloraprep       Needles:  Injection technique: Single-shot  Needle Type: Echogenic Needle     Needle Length: 5cm 5 cm Needle Gauge: 22 and 22 G    Additional Needles:  Procedures: ultrasound guided Supraclavicular block Narrative:  Start time: 10/04/2011 6:15 PM End time: 10/04/2011 6:23 PM Injection made incrementally with aspirations every 5 mL.  Performed by: Personally  Anesthesiologist: Sheldon Silvan  Additional Notes: Marcaine 0.5% with 1:200000 Epi   25 ml  Supraclavicular block Procedure Name: Intubation Date/Time: 10/04/2011 6:34 PM Performed by: Ellin Goodie Pre-anesthesia Checklist: Patient identified, Emergency Drugs available, Suction available, Patient being monitored and Timeout performed Patient Re-evaluated:Patient Re-evaluated prior to inductionOxygen Delivery Method: Circle system utilized Preoxygenation: Pre-oxygenation with 100% oxygen Intubation Type: IV induction and Rapid sequence Ventilation: Mask ventilation without difficulty Laryngoscope Size: Mac and 3 Grade View: Grade I Tube type: Oral Number of attempts: 1 Airway Equipment and Method: Stylet Placement Confirmation: ETT inserted through vocal cords under direct vision,  positive ETCO2 and breath sounds checked- equal and bilateral Secured at: 22 cm Tube secured with: Tape Dental Injury: Teeth and Oropharynx as per pre-operative assessment

## 2011-10-04 NOTE — ED Notes (Signed)
Pt wearing wedding band to left finger, removed, placed in cup, given to pt aunt sitting bedside

## 2011-10-04 NOTE — Anesthesia Preprocedure Evaluation (Addendum)
Anesthesia Evaluation  Patient identified by MRN, date of birth, ID band Patient awake    Reviewed: Allergy & Precautions, H&P , NPO status , Patient's Chart, lab work & pertinent test results, reviewed documented beta blocker date and time   Airway Mallampati: I TM Distance: <3 FB Neck ROM: Full    Dental  (+) Teeth Intact and Dental Advisory Given   Pulmonary asthma ,  breath sounds clear to auscultation        Cardiovascular hypertension, Pt. on medications Rhythm:Regular Rate:Normal     Neuro/Psych Anxiety Depression    GI/Hepatic   Endo/Other  Hypothyroidism   Renal/GU      Musculoskeletal   Abdominal (+)  Abdomen: soft. Bowel sounds: normal.  Peds  Hematology   Anesthesia Other Findings   Reproductive/Obstetrics                         Anesthesia Physical Anesthesia Plan  ASA: II and Emergent  Anesthesia Plan: General   Post-op Pain Management:    Induction: Intravenous, Rapid sequence and Cricoid pressure planned  Airway Management Planned: Oral ETT  Additional Equipment:   Intra-op Plan:   Post-operative Plan: Extubation in OR  Informed Consent: I have reviewed the patients History and Physical, chart, labs and discussed the procedure including the risks, benefits and alternatives for the proposed anesthesia with the patient or authorized representative who has indicated his/her understanding and acceptance.   Dental advisory given  Plan Discussed with: CRNA, Anesthesiologist and Surgeon  Anesthesia Plan Comments:         Anesthesia Quick Evaluation

## 2011-10-04 NOTE — ED Notes (Addendum)
Patient's wife Fannie Knee left [phone] as her phone number. She will be returning in two hours.

## 2011-10-05 ENCOUNTER — Encounter (HOSPITAL_COMMUNITY): Payer: Self-pay | Admitting: General Practice

## 2011-10-05 MED ORDER — OXYCODONE HCL 5 MG PO TABS: 5.0000 mg | ORAL_TABLET | ORAL | Status: AC | PRN

## 2011-10-05 MED ORDER — ATORVASTATIN CALCIUM 40 MG PO TABS
40.0000 mg | ORAL_TABLET | Freq: Every day | ORAL | Status: DC
  Filled 2011-10-05: qty 1

## 2011-10-05 MED ORDER — LEVOTHYROXINE SODIUM 112 MCG PO TABS
112.0000 ug | ORAL_TABLET | ORAL | Status: AC
  Administered 2011-10-05: 112 ug via ORAL
  Filled 2011-10-05: qty 1

## 2011-10-05 MED ORDER — ENALAPRIL MALEATE 10 MG PO TABS
10.0000 mg | ORAL_TABLET | Freq: Every day | ORAL | Status: DC
  Administered 2011-10-05: 10 mg via ORAL
  Filled 2011-10-05: qty 1

## 2011-10-05 MED ORDER — METHOCARBAMOL 500 MG PO TABS: 500.0000 mg | ORAL_TABLET | Freq: Four times a day (QID) | ORAL | Status: AC | PRN

## 2011-10-05 MED ORDER — LORATADINE 10 MG PO TABS
10.0000 mg | ORAL_TABLET | Freq: Every day | ORAL | Status: DC | PRN
  Filled 2011-10-05: qty 1

## 2011-10-05 MED ORDER — VITAMIN D3 25 MCG (1000 UNIT) PO TABS
1000.0000 [IU] | ORAL_TABLET | Freq: Every day | ORAL | Status: DC
  Administered 2011-10-05: 1000 [IU] via ORAL
  Filled 2011-10-05: qty 1

## 2011-10-05 MED ORDER — LEVOTHYROXINE SODIUM 112 MCG PO TABS
112.0000 ug | ORAL_TABLET | Freq: Every day | ORAL | Status: DC
  Filled 2011-10-05: qty 1

## 2011-10-05 MED ORDER — VITAMIN B-12 1000 MCG PO TABS
1000.0000 ug | ORAL_TABLET | Freq: Every day | ORAL | Status: DC
  Administered 2011-10-05: 1000 ug via ORAL
  Filled 2011-10-05: qty 1

## 2011-10-05 NOTE — Progress Notes (Signed)
Occupational Therapy Evaluation Patient Details Name: Donald Phillips. MRN: [id number] DOB: [birth year] Today's Date: 10/05/2011 Time: 7829-5621 OT Time Calculation (min): 17 min  OT Assessment / Plan / Recommendation Clinical Impression  66 yo s/p fall from ladder with resulting L wrist fracture. Underwent ORIF. Ptwill benefit from skilled OT, secondary to deficits listed below,  to max indep with ADL and educate pt/family on edema control and ROM.    OT Assessment  Patient needs continued OT Services    Follow Up Recommendations  Outpatient OT (as determined by MD)    Barriers to Discharge None    Equipment Recommendations    none   Recommendations for Other Services  none  Frequency  Min 2X/week    Precautions / Restrictions Restrictions Weight Bearing Restrictions: Yes LUE Weight Bearing: Non weight bearing Other Position/Activity Restrictions: through hand   Pertinent Vitals/Pain 2    ADL  Eating/Feeding: Simulated;Set up Where Assessed - Eating/Feeding: Chair Grooming: Simulated;Set up Where Assessed - Grooming: Unsupported sitting Upper Body Bathing: Simulated;Minimal assistance Where Assessed - Upper Body Bathing: Unsupported sitting Lower Body Bathing: Simulated;Set up Where Assessed - Lower Body Bathing: Unsupported sit to stand Upper Body Dressing: Simulated;Minimal assistance Where Assessed - Upper Body Dressing: Unsupported sitting Lower Body Dressing: Simulated;Minimal assistance Where Assessed - Lower Body Dressing: Unsupported sit to stand Toilet Transfer: Performed;Modified independent Toilet Transfer Method: Sit to Barista: Regular height toilet Toileting - Clothing Manipulation and Hygiene: Performed;Modified independent Where Assessed - Toileting Clothing Manipulation and Hygiene: Standing Equipment Used: Gait belt Transfers/Ambulation Related to ADLs: mod I ADL Comments: set up to min A. wife able to assist    OT  Diagnosis: Generalized weakness;Acute pain  OT Problem List: Decreased range of motion;Decreased coordination;Decreased knowledge of use of DME or AE;Decreased knowledge of precautions;Pain;Impaired UE functional use OT Treatment Interventions: Self-care/ADL training;Therapeutic exercise;Therapeutic activities;Patient/family education   OT Goals Acute Rehab OT Goals OT Goal Formulation: With patient Time For Goal Achievement: 10/12/11 Potential to Achieve Goals: Good ADL Goals Additional ADL Goal #1: Pt/wife will be independent in ADL retraining with 1 handed techniques. ADL Goal: Additional Goal #1 - Progress: Goal set today Arm Goals Pt Will Perform AROM: Independently;Left upper extremity;1 set;Other (comment) (L IP ROM, elbow and shoulder.) Arm Goal: AROM - Progress: Goal set today Additional Arm Goal #1: Pt/wife will be independent with edema control, precautions LUE and HEP. Arm Goal: Additional Goal #1 - Progress: Goal set today  Visit Information  Last OT Received On: 10/05/11    Subjective Data  Subjective: I'm trying to stay on top of the pain.   Prior Functioning  Home Living Lives With: Spouse Available Help at Discharge: Family;Available 24 hours/day Type of Home: House Home Access: Stairs to enter [employer] of Steps: 2 Entrance Stairs-Rails: None Home Layout: One level Bathroom Shower/Tub: Forensic scientist: Standard Bathroom Accessibility: Yes How Accessible: Accessible via walker Home Adaptive Equipment: None Prior Function Level of Independence: Independent Able to Take Stairs?: Yes Driving: Yes Vocation: Retired Musician: No difficulties Dominant Hand: Right    Cognition  Overall Cognitive Status: Appears within functional limits for tasks assessed/performed Arousal/Alertness: Awake/alert Orientation Level: Appears intact for tasks assessed Behavior During Session: [facility] for tasks performed      Extremity/Trunk Assessment Right Upper Extremity Assessment RUE ROM/Strength/Tone: Within functional levels RUE Sensation: WFL - Light Touch;WFL - Proprioception RUE Coordination: WFL - gross/fine motor Left Upper Extremity Assessment LUE ROM/Strength/Tone: Deficits;Due to precautions LUE ROM/Strength/Tone Deficits:  restrictions of MP and wrist due to splint LUE Sensation: WFL - Light Touch;WFL - Proprioception LUE Coordination: Deficits   Mobility Bed Mobility Bed Mobility: Supine to Sit Supine to Sit: 6: Modified independent (Device/Increase time) Transfers Transfers: Sit to Stand;Stand to Sit Sit to Stand: 6: Modified independent (Device/Increase time) Stand to Sit: 6: Modified independent (Device/Increase time)   Exercise    Balance  WFL  End of Session OT - End of Session Equipment Utilized During Treatment: Gait belt Activity Tolerance: Patient tolerated treatment well Patient left: in chair;with call bell/phone within reach;with family/visitor present Nurse Communication: Mobility status   Delorise Hunkele,HILLARY 10/05/2011, 10:30 AM [facility], OTR/L  [phone] 10/05/2011

## 2011-10-05 NOTE — Progress Notes (Signed)
Occupational Therapy Treatment Patient Details Name: Donald Phillips. MRN: [id number] DOB: [birth year] Today's Date: 10/05/2011 Time: 4098-1191 OT Time Calculation (min): 25 min  OT Assessment / Plan / Recommendation Comments on Treatment Session Focus of session on ADL retraining using 1 handed technique, HEP for L PIP,DIP, elbow, shoulder within pain tolerance and edema control with ice and positioining. Pt/wife demonstrated understanding. Written information given.OK to D/C with wife, who will provide 24/7 S.    Follow Up Recommendations  Outpatient OT    Barriers to Discharge  None    Equipment Recommendations    none   Recommendations for Other Services  none  Frequency Min 2X/week   Plan Discharge plan remains appropriate    Precautions / Restrictions Restrictions Weight Bearing Restrictions: Yes LUE Weight Bearing: Non weight bearing Other Position/Activity Restrictions: through hand   Pertinent Vitals/Pain O2 sats 98 RA. HR 81       OT Diagnosis: Generalized weakness;Acute pain  OT Problem List: Decreased range of motion;Decreased coordination;Decreased knowledge of use of DME or AE;Decreased knowledge of precautions;Pain;Impaired UE functional use OT Treatment Interventions: Self-care/ADL training;Therapeutic exercise;Therapeutic activities;Patient/family education   OT Goals Acute Rehab OT Goals OT Goal Formulation: With patient Time For Goal Achievement: 10/12/11 Potential to Achieve Goals: Good ADL Goals Additional ADL Goal #1: Pt/wife will be independent in ADL retraining with 1 handed techniques. ADL Goal: Additional Goal #1 - Progress: Met Arm Goals Pt Will Perform AROM: Independently;Left upper extremity;1 set;Other (comment) Arm Goal: AROM - Progress: Met Additional Arm Goal #1: Pt/wife will be independent with edema control, precautions LUE and HEP. Arm Goal: Additional Goal #1 - Progress: Met  Visit Information  Last OT Received On: 10/05/11      Subjective Data  Subjective: I'm trying to stay on top of the pain.      Cognition  Overall Cognitive Status: Appears within functional limits for tasks assessed/performed Arousal/Alertness: Awake/alert Orientation Level: Appears intact for tasks assessed Behavior During Session: [facility] for tasks performed    Mobility Bed Mobility Bed Mobility: Supine to Sit Supine to Sit: 6: Modified independent (Device/Increase time) Transfers Transfers: Sit to Stand;Stand to Sit Sit to Stand: 6: Modified independent (Device/Increase time) Stand to Sit: 6: Modified independent (Device/Increase time)   Exercises Other Exercises - L DIP, PIP ROM, elbow and shoulder within pain tolerance Other Exercises: education oedema control with use of ice and positioning  Balance  WFL  End of Session OT - End of Session Equipment Utilized During Treatment: Gait belt Activity Tolerance: Patient tolerated treatment well Patient left: in chair;with call bell/phone within reach;with family/visitor present Nurse Communication: Mobility status   Thamar Holik,HILLARY 10/05/2011, 10:36 AM Luisa Dago, OTR/L  [phone] 10/05/2011

## 2011-10-05 NOTE — Discharge Summary (Signed)
.. Physician Discharge Summary  Patient ID: Donald Phillips. MRN: [id number] DOB/AGE: 07-20-1945 66 y.o.  Admit date: 10/04/2011 Discharge date: 10/05/2011  Admission Diagnoses: Left comminuted distal radius fracture Left comminuted scaphoid fracture HYPOTHYROIDISM HYPOGONADISM VITAMIN D DEFICIENCY HYPERLIPIDEMIA HYPERTENSION BRONCHITIS, ACUTE ALLERGIC RHINITIS DIVERTICULOSIS, COLON ERECTILE DYSFUNCTION LOW BACK PAIN LEG PAIN FATIGUE WHEEZING ELEVATED BP TOBACCO USE, QUIT B12 deficiency Hyperkalemia   Discharge Diagnoses: S/P ORIF left comminuted complex intra-articular distal radius fracture S/P ORIF left scaphoid fracture See Above  Discharged Condition: Improved  Hospital Course: The patient is an extremely pleasant 66 yo male who presented to the [facility] for evaluation of his left upper extremity and left hip after falling approximately 10 feet from a ladder yesterday. The patient was noted to have a comminuted left intra-articular distal radius fracture and left comminuted scaphoid fracture. Left hip radiographs were negative. The patient denied head, neck, back or other injury. Given the nature of his fracture and degree of comminution, the decision was made to proceed with surgical intervention. Preoperative labs and cxr were stable and the patient underwent the above procedure without difficulties (see op note for details). The patient was admitted to the orthopedic unit for standard post-op fracture care. He did very well and his course was uncomplicated. Post op day 1 his pain was controlled, he was tolerating a regular diet and voiding without difficulties. The patient has a history of constipation and takes a regular supplement/stool softener at home. The patient does have a history of hyperkalemia, currently managed by Dr. Shirl Harris, currently observatory care has only been implemented. The decision was made to discharge the patient home.  Consults: none}  Discharge Exam: Blood  pressure 146/71, pulse 76, temperature 99 F (37.2 C), temperature source Oral, resp. rate 18, height 5\' 9"  (1.753 m), weight 97.523 kg (215 lb), SpO2 95.00%. Marland Kitchen.Patient presents for evaluation and treatment of the of their upper extremity predicament. The patient denies neck back chest or of abdominal pain. The patient states the left hip is sore, SLR is intact, IR ER of hip is nontender, pt is weightbearing without difficulty.  Evaluation of the left upper extremity reveals his splint is clean and dry, digital rom is intact, no signs of neurovascular compromise is noted. Drain is removed without difficulty. Disposition: 01-Home or Self Care  Discharge Orders    Future Appointments: Provider: Department: Dept Phone: Center:   01/14/2012 8:45 AM Tresa Garter, MD [facility] [phone] [facility]     Future Orders Please Complete By Expires   Diet - low sodium heart healthy      Call MD / Call 911      Comments:   If you experience chest pain or shortness of breath, CALL 911 and be transported to the hospital emergency room.  If you develope a fever above 101 F, pus (white drainage) or increased drainage or redness at the wound, or calf pain, call your surgeon's office.   Constipation Prevention      Comments:   Drink plenty of fluids.  Prune juice may be helpful.  You may use a stool softener, such as Colace (over the counter) 100 mg twice a day.  Use MiraLax (over the counter) for constipation as needed.   Increase activity slowly as tolerated      Discharge instructions      Comments:   Marland KitchenMarland KitchenKeep bandage clean and dry.  Call for any problems.  No smoking.  Criteria for driving a car: you should be off your pain  medicine for 7-8 hours, able to drive one handed(confident), thinking clearly and feeling able in your judgement to drive. Continue elevation as it will decrease swelling.  If instructed by MD move your fingers within the confines of the bandage/splint.  Use ice if instructed by your MD.  Call immediately for any sudden loss of feeling in your hand/arm or change in functional abilities of the extremity. Marland Kitchen.We recommend that you to take vitamin C 1000 mg a day to promote healing we also recommend that if you require her pain medicine that he take a stool softener to prevent constipation as most pain medicines will have constipation side effects. We recommend either Peri-Colace or Senokot and recommend that you also consider adding MiraLAX to prevent the constipation affects from pain medicine if you are required to use them. These medicines are over the counter and maybe purchased at a local pharmacy.      Medication List  As of 10/05/2011  2:49 PM   TAKE these medications         cholecalciferol 1000 UNITS tablet   Commonly known as: VITAMIN D   Take 1,000 Units by mouth daily.      enalapril 10 MG tablet   Commonly known as: VASOTEC   TAKE ONE TABLET BY MOUTH EVERY DAY FOR BLOOD PRESSURE      fexofenadine-pseudoephedrine 60-120 MG per tablet   Commonly known as: ALLEGRA-D   Take 1 tablet by mouth 2 (two) times daily as needed.      levothyroxine 112 MCG tablet   Commonly known as: SYNTHROID, LEVOTHROID   TAKE ONE TABLET BY MOUTH EVERY DAY      methocarbamol 500 MG tablet   Commonly known as: ROBAXIN   Take 1 tablet (500 mg total) by mouth every 6 (six) hours as needed.      oxyCODONE 5 MG immediate release tablet   Commonly known as: Oxy IR/ROXICODONE   Take 1 tablet (5 mg total) by mouth every 3 (three) hours as needed.      rosuvastatin 20 MG tablet   Commonly known as: CRESTOR   Take 1 tablet (20 mg total) by mouth daily.      Testosterone 20.25 MG/ACT (1.62%) Gel   Place 4 Act onto the skin every morning.      VIAGRA 100 MG tablet   Generic drug: sildenafil   TAKE ONE TABLET BY MOUTH EVERY DAY AS NEEDED      Vitamin B-12 1000 MCG Subl   Place 1 tablet (1,000 mcg total) under the tongue daily.           Follow-up Information    Follow up with  Karen Chafe, MD. Schedule an appointment as soon as possible for a visit in 10 days.   Contact information:   [address] [place] [address] [phone]          Signed: Sheran Lawless 10/05/2011, 2:49 PM

## 2011-10-05 NOTE — OR Nursing (Signed)
Late Entry: Dressings added to procedure.

## 2011-10-11 ENCOUNTER — Other Ambulatory Visit: Payer: Self-pay | Admitting: Internal Medicine

## 2011-10-12 ENCOUNTER — Telehealth: Payer: Self-pay | Admitting: *Deleted

## 2011-10-12 NOTE — Telephone Encounter (Signed)
Requested Medications     ANDROGEL PUMP 1.25 GM/ACT (1%) GEL [Pharmacy Med Name: ANDROGEL PUMP 1% GEL]   APPLY 10 GRAMS TOPICALLY EVERY DAY   Disp: 150 g R: 4 Start: 10/11/2011  Class: Normal   Requested on: 10/09/2009   Last refill: 01/15/2010

## 2011-10-12 NOTE — Telephone Encounter (Signed)
OK to fill this prescription with additional refills x5 Thank you!  

## 2011-10-13 ENCOUNTER — Telehealth: Payer: Self-pay | Admitting: *Deleted

## 2011-10-13 MED ORDER — TESTOSTERONE 20.25 MG/ACT (1.62%) TD GEL: 4.0000 | TRANSDERMAL | Status: DC

## 2011-10-13 NOTE — Telephone Encounter (Signed)
PA for Androgel 1 % approved until 10/12/2016. Ref ID [id number]. Letter will be mailed to pt. Pharmacy informed.

## 2011-10-13 NOTE — Telephone Encounter (Signed)
Done

## 2012-01-06 ENCOUNTER — Other Ambulatory Visit (INDEPENDENT_AMBULATORY_CARE_PROVIDER_SITE_OTHER): Payer: BC Managed Care – PPO

## 2012-01-06 DIAGNOSIS — E538 Deficiency of other specified B group vitamins: Secondary | ICD-10-CM

## 2012-01-06 DIAGNOSIS — E039 Hypothyroidism, unspecified: Secondary | ICD-10-CM

## 2012-01-06 DIAGNOSIS — E559 Vitamin D deficiency, unspecified: Secondary | ICD-10-CM

## 2012-01-06 DIAGNOSIS — N529 Male erectile dysfunction, unspecified: Secondary | ICD-10-CM

## 2012-01-06 DIAGNOSIS — E291 Testicular hypofunction: Secondary | ICD-10-CM

## 2012-01-06 DIAGNOSIS — E875 Hyperkalemia: Secondary | ICD-10-CM

## 2012-01-06 DIAGNOSIS — R030 Elevated blood-pressure reading, without diagnosis of hypertension: Secondary | ICD-10-CM

## 2012-01-06 LAB — HEPATIC FUNCTION PANEL
ALT: 22 U/L (ref 0–53)
Albumin: 3.8 g/dL (ref 3.5–5.2)
Alkaline Phosphatase: 72 U/L (ref 39–117)
Total Protein: 7.1 g/dL (ref 6.0–8.3)

## 2012-01-06 LAB — BASIC METABOLIC PANEL
BUN: 19 mg/dL (ref 6–23)
Calcium: 8.8 mg/dL (ref 8.4–10.5)
GFR: 63.98 mL/min (ref >60.00)
Potassium: 5.1 mEq/L (ref 3.5–5.1)
Sodium: 139 mEq/L (ref 135–145)

## 2012-01-06 LAB — LIPID PANEL
Cholesterol: 289 mg/dL — ABNORMAL HIGH (ref 0–200)
VLDL: 144.2 mg/dL — ABNORMAL HIGH (ref 0.0–40.0)

## 2012-01-13 DIAGNOSIS — S52599A Other fractures of lower end of unspecified radius, initial encounter for closed fracture: Secondary | ICD-10-CM | POA: Diagnosis not present

## 2012-01-14 ENCOUNTER — Ambulatory Visit (INDEPENDENT_AMBULATORY_CARE_PROVIDER_SITE_OTHER): Payer: Medicare Other | Admitting: Internal Medicine

## 2012-01-14 ENCOUNTER — Encounter: Payer: Self-pay | Admitting: Internal Medicine

## 2012-01-14 VITALS — BP 160/70 | HR 76 | Temp 98.2°F | Resp 16 | Wt 209.0 lb

## 2012-01-14 DIAGNOSIS — S62109A Fracture of unspecified carpal bone, unspecified wrist, initial encounter for closed fracture: Secondary | ICD-10-CM | POA: Diagnosis not present

## 2012-01-14 DIAGNOSIS — M545 Low back pain, unspecified: Secondary | ICD-10-CM

## 2012-01-14 DIAGNOSIS — S62102A Fracture of unspecified carpal bone, left wrist, initial encounter for closed fracture: Secondary | ICD-10-CM

## 2012-01-14 DIAGNOSIS — E538 Deficiency of other specified B group vitamins: Secondary | ICD-10-CM

## 2012-01-14 DIAGNOSIS — I10 Essential (primary) hypertension: Secondary | ICD-10-CM | POA: Diagnosis not present

## 2012-01-14 DIAGNOSIS — E039 Hypothyroidism, unspecified: Secondary | ICD-10-CM

## 2012-01-14 DIAGNOSIS — E785 Hyperlipidemia, unspecified: Secondary | ICD-10-CM

## 2012-01-14 DIAGNOSIS — N529 Male erectile dysfunction, unspecified: Secondary | ICD-10-CM | POA: Diagnosis not present

## 2012-01-14 DIAGNOSIS — E559 Vitamin D deficiency, unspecified: Secondary | ICD-10-CM

## 2012-01-14 DIAGNOSIS — E291 Testicular hypofunction: Secondary | ICD-10-CM

## 2012-01-14 MED ORDER — TESTOSTERONE 20.25 MG/ACT (1.62%) TD GEL: 4.0000 | TRANSDERMAL | Status: DC

## 2012-01-14 MED ORDER — VARDENAFIL HCL 20 MG PO TABS: 20.0000 mg | ORAL_TABLET | Freq: Every day | ORAL | Status: DC | PRN

## 2012-01-14 MED ORDER — ROSUVASTATIN CALCIUM 20 MG PO TABS: 20.0000 mg | ORAL_TABLET | Freq: Every day | ORAL | Status: DC

## 2012-01-14 NOTE — Assessment & Plan Note (Addendum)
Will restrt testost Try Levitra

## 2012-01-14 NOTE — Assessment & Plan Note (Addendum)
See meds; restart testost gel

## 2012-01-14 NOTE — Assessment & Plan Note (Signed)
Continue with current prescription therapy as reflected on the Med list.  

## 2012-01-14 NOTE — Assessment & Plan Note (Signed)
Better  

## 2012-01-14 NOTE — Progress Notes (Signed)
   Subjective:    Patient ID: Donald Phillips., male    DOB: Aug 27, 1945, 66 y.o.   MRN: [id number]  HPI  The patient presents for a follow-up of  chronic hypertension, chronic dyslipidemia, hypogonadism controlled with medicines, however he had stopped some of them lately. He had L wrist fx in 5/13. C/o ED - viagra is not helping  Wt Readings from Last 3 Encounters:  01/14/12 209 lb (94.802 kg)  10/05/11 215 lb (97.523 kg)  10/05/11 215 lb (97.523 kg)   BP Readings from Last 3 Encounters:  01/14/12 160/70  10/05/11 146/71  10/05/11 146/71        Review of Systems  Constitutional: Negative for appetite change, fatigue and unexpected weight change.  HENT: Negative for nosebleeds, congestion, sore throat, sneezing, trouble swallowing and neck pain.   Eyes: Negative for itching and visual disturbance.  Respiratory: Negative for cough.   Cardiovascular: Negative for chest pain, palpitations and leg swelling.  Gastrointestinal: Negative for nausea, diarrhea, blood in stool and abdominal distention.  Genitourinary: Negative for frequency and hematuria.  Musculoskeletal: Negative for back pain, joint swelling and gait problem.  Skin: Negative for rash.  Neurological: Negative for dizziness, tremors, speech difficulty and weakness.  Psychiatric/Behavioral: Negative for disturbed wake/sleep cycle, dysphoric mood and agitation. The patient is not nervous/anxious.        Objective:   Physical Exam  Constitutional: He is oriented to person, place, and time. He appears well-developed.  HENT:  Mouth/Throat: Oropharynx is clear and moist.  Eyes: Conjunctivae are normal. Pupils are equal, round, and reactive to light.  Neck: Normal range of motion. No JVD present. No thyromegaly present.  Cardiovascular: Normal rate, regular rhythm, normal heart sounds and intact distal pulses.  Exam reveals no gallop and no friction rub.   No murmur heard. Pulmonary/Chest: Effort normal and breath  sounds normal. No respiratory distress. He has no wheezes. He has no rales. He exhibits no tenderness.  Abdominal: Soft. Bowel sounds are normal. He exhibits no distension and no mass. There is no tenderness. There is no rebound and no guarding.  Musculoskeletal: Normal range of motion. He exhibits tenderness (R heel is tender). He exhibits no edema.  Lymphadenopathy:    He has no cervical adenopathy.  Neurological: He is alert and oriented to person, place, and time. He has normal reflexes. No cranial nerve deficit. He exhibits normal muscle tone. Coordination normal.  Skin: Skin is warm and dry. No rash noted.  Psychiatric: He has a normal mood and affect. His behavior is normal. Judgment and thought content normal.    Lab Results  Component Value Date   WBC 13.8* 10/04/2011   HGB 13.8 10/04/2011   HCT 42.5 10/04/2011   PLT 262 10/04/2011   GLUCOSE 97 01/06/2012   CHOL 289* 01/06/2012   TRIG 721.0 Triglyceride is over 400; calculations on Lipids are invalid.* 01/06/2012   HDL 39.20 01/06/2012   LDLDIRECT 93.7 01/06/2012   ALT 22 01/06/2012   AST 24 01/06/2012   NA 139 01/06/2012   K 5.1 01/06/2012   CL 106 01/06/2012   CREATININE 1.2 01/06/2012   BUN 19 01/06/2012   CO2 25 01/06/2012   TSH 3.07 06/26/2010   PSA 2.97 06/26/2010   INR 1.00 10/04/2011         Assessment & Plan:

## 2012-01-14 NOTE — Assessment & Plan Note (Addendum)
Restart Crestor  

## 2012-01-14 NOTE — Assessment & Plan Note (Signed)
Continue with current prescription therapy as reflected on the Med list. Will monitor

## 2012-02-28 ENCOUNTER — Other Ambulatory Visit: Payer: Self-pay | Admitting: Internal Medicine

## 2012-03-09 DIAGNOSIS — S62009A Unspecified fracture of navicular [scaphoid] bone of unspecified wrist, initial encounter for closed fracture: Secondary | ICD-10-CM | POA: Diagnosis not present

## 2012-03-16 DIAGNOSIS — L570 Actinic keratosis: Secondary | ICD-10-CM | POA: Diagnosis not present

## 2012-03-16 DIAGNOSIS — L578 Other skin changes due to chronic exposure to nonionizing radiation: Secondary | ICD-10-CM | POA: Diagnosis not present

## 2012-03-16 DIAGNOSIS — Z85828 Personal history of other malignant neoplasm of skin: Secondary | ICD-10-CM | POA: Diagnosis not present

## 2012-03-16 DIAGNOSIS — L819 Disorder of pigmentation, unspecified: Secondary | ICD-10-CM | POA: Diagnosis not present

## 2012-04-04 ENCOUNTER — Other Ambulatory Visit (INDEPENDENT_AMBULATORY_CARE_PROVIDER_SITE_OTHER): Payer: Medicare Other

## 2012-04-04 DIAGNOSIS — E538 Deficiency of other specified B group vitamins: Secondary | ICD-10-CM | POA: Diagnosis not present

## 2012-04-04 DIAGNOSIS — E291 Testicular hypofunction: Secondary | ICD-10-CM

## 2012-04-04 DIAGNOSIS — E785 Hyperlipidemia, unspecified: Secondary | ICD-10-CM

## 2012-04-04 DIAGNOSIS — M545 Low back pain, unspecified: Secondary | ICD-10-CM

## 2012-04-04 DIAGNOSIS — N529 Male erectile dysfunction, unspecified: Secondary | ICD-10-CM

## 2012-04-04 DIAGNOSIS — E039 Hypothyroidism, unspecified: Secondary | ICD-10-CM

## 2012-04-04 DIAGNOSIS — E559 Vitamin D deficiency, unspecified: Secondary | ICD-10-CM

## 2012-04-04 DIAGNOSIS — I10 Essential (primary) hypertension: Secondary | ICD-10-CM

## 2012-04-04 DIAGNOSIS — S62109A Fracture of unspecified carpal bone, unspecified wrist, initial encounter for closed fracture: Secondary | ICD-10-CM | POA: Diagnosis not present

## 2012-04-04 DIAGNOSIS — S62102A Fracture of unspecified carpal bone, left wrist, initial encounter for closed fracture: Secondary | ICD-10-CM

## 2012-04-04 LAB — HEPATIC FUNCTION PANEL
ALT: 19 U/L (ref 0–53)
AST: 26 U/L (ref 0–37)
Alkaline Phosphatase: 63 U/L (ref 39–117)
Bilirubin, Direct: 0.1 mg/dL (ref 0.0–0.3)
Total Protein: 7.7 g/dL (ref 6.0–8.3)

## 2012-04-04 LAB — BASIC METABOLIC PANEL
BUN: 15 mg/dL (ref 6–23)
Calcium: 9.1 mg/dL (ref 8.4–10.5)
Creatinine, Ser: 1.4 mg/dL (ref 0.4–1.5)
GFR: 54.02 mL/min — ABNORMAL LOW (ref >60.00)
Glucose, Bld: 97 mg/dL (ref 70–99)
Sodium: 138 mEq/L (ref 135–145)

## 2012-04-04 LAB — LIPID PANEL: HDL: 40.6 mg/dL (ref >39.00)

## 2012-04-13 DIAGNOSIS — IMO0001 Reserved for inherently not codable concepts without codable children: Secondary | ICD-10-CM | POA: Diagnosis not present

## 2012-04-13 DIAGNOSIS — J209 Acute bronchitis, unspecified: Secondary | ICD-10-CM | POA: Diagnosis not present

## 2012-04-13 DIAGNOSIS — J039 Acute tonsillitis, unspecified: Secondary | ICD-10-CM | POA: Diagnosis not present

## 2012-04-13 DIAGNOSIS — J111 Influenza due to unidentified influenza virus with other respiratory manifestations: Secondary | ICD-10-CM | POA: Diagnosis not present

## 2012-04-14 ENCOUNTER — Ambulatory Visit (INDEPENDENT_AMBULATORY_CARE_PROVIDER_SITE_OTHER): Payer: Medicare Other | Admitting: Internal Medicine

## 2012-04-14 ENCOUNTER — Encounter: Payer: Self-pay | Admitting: Internal Medicine

## 2012-04-14 VITALS — BP 130/90 | HR 80 | Temp 99.4°F | Resp 16 | Wt 210.0 lb

## 2012-04-14 DIAGNOSIS — M545 Low back pain, unspecified: Secondary | ICD-10-CM

## 2012-04-14 DIAGNOSIS — E039 Hypothyroidism, unspecified: Secondary | ICD-10-CM

## 2012-04-14 DIAGNOSIS — E291 Testicular hypofunction: Secondary | ICD-10-CM

## 2012-04-14 DIAGNOSIS — F32A Depression, unspecified: Secondary | ICD-10-CM | POA: Insufficient documentation

## 2012-04-14 DIAGNOSIS — R5383 Other fatigue: Secondary | ICD-10-CM

## 2012-04-14 DIAGNOSIS — I10 Essential (primary) hypertension: Secondary | ICD-10-CM | POA: Diagnosis not present

## 2012-04-14 DIAGNOSIS — E559 Vitamin D deficiency, unspecified: Secondary | ICD-10-CM

## 2012-04-14 DIAGNOSIS — R5381 Other malaise: Secondary | ICD-10-CM

## 2012-04-14 DIAGNOSIS — E538 Deficiency of other specified B group vitamins: Secondary | ICD-10-CM | POA: Diagnosis not present

## 2012-04-14 DIAGNOSIS — F341 Dysthymic disorder: Secondary | ICD-10-CM

## 2012-04-14 DIAGNOSIS — E785 Hyperlipidemia, unspecified: Secondary | ICD-10-CM

## 2012-04-14 DIAGNOSIS — F329 Major depressive disorder, single episode, unspecified: Secondary | ICD-10-CM

## 2012-04-14 DIAGNOSIS — N529 Male erectile dysfunction, unspecified: Secondary | ICD-10-CM

## 2012-04-14 MED ORDER — BUPROPION HCL ER (XL) 150 MG PO TB24: 150.0000 mg | ORAL_TABLET | Freq: Every day | ORAL | Status: DC

## 2012-04-14 NOTE — Assessment & Plan Note (Signed)
Continue with current prescription therapy as reflected on the Med list. Labs reviewed

## 2012-04-14 NOTE — Assessment & Plan Note (Signed)
Continue with current prescription therapy as reflected on the Med list.  

## 2012-04-14 NOTE — Assessment & Plan Note (Signed)
Chronic. 

## 2012-04-14 NOTE — Assessment & Plan Note (Signed)
Better  

## 2012-04-14 NOTE — Assessment & Plan Note (Signed)
Discussed - will start Wellbutrin

## 2012-04-14 NOTE — Progress Notes (Signed)
   Subjective:    Patient ID: Donald Phillips, male    DOB: 1946-02-16, 66 y.o.   MRN: [id number]  HPI  The patient presents for a follow-up of  chronic hypertension, chronic dyslipidemia, hypogonadism controlled with medicines, however he had stopped some of them lately. He had L wrist fx in 5/13. F/u on ED C/o depression - his dad has dementia  Wt Readings from Last 3 Encounters:  04/14/12 210 lb (95.255 kg)  01/14/12 209 lb (94.802 kg)  10/05/11 215 lb (97.523 kg)   BP Readings from Last 3 Encounters:  04/14/12 130/90  01/14/12 160/70  10/05/11 146/71        Review of Systems  Constitutional: Negative for appetite change, fatigue and unexpected weight change.  HENT: Negative for nosebleeds, congestion, sore throat, sneezing, trouble swallowing and neck pain.   Eyes: Negative for itching and visual disturbance.  Respiratory: Negative for cough.   Cardiovascular: Negative for chest pain, palpitations and leg swelling.  Gastrointestinal: Negative for nausea, diarrhea, blood in stool and abdominal distention.  Genitourinary: Negative for frequency and hematuria.  Musculoskeletal: Negative for back pain, joint swelling and gait problem.  Skin: Negative for rash.  Neurological: Negative for dizziness, tremors, speech difficulty and weakness.  Psychiatric/Behavioral: Negative for sleep disturbance, dysphoric mood and agitation. The patient is not nervous/anxious.        Objective:   Physical Exam  Constitutional: He is oriented to person, place, and time. He appears well-developed.  HENT:  Mouth/Throat: Oropharynx is clear and moist.  Eyes: Conjunctivae normal are normal. Pupils are equal, round, and reactive to light.  Neck: Normal range of motion. No JVD present. No thyromegaly present.  Cardiovascular: Normal rate, regular rhythm, normal heart sounds and intact distal pulses.  Exam reveals no gallop and no friction rub.   No murmur heard. Pulmonary/Chest: Effort  normal and breath sounds normal. No respiratory distress. He has no wheezes. He has no rales. He exhibits no tenderness.  Abdominal: Soft. Bowel sounds are normal. He exhibits no distension and no mass. There is no tenderness. There is no rebound and no guarding.  Musculoskeletal: Normal range of motion. He exhibits tenderness (R heel is tender). He exhibits no edema.  Lymphadenopathy:    He has no cervical adenopathy.  Neurological: He is alert and oriented to person, place, and time. He has normal reflexes. No cranial nerve deficit. He exhibits normal muscle tone. Coordination normal.  Skin: Skin is warm and dry. No rash noted.  Psychiatric: He has a normal mood and affect. His behavior is normal. Judgment and thought content normal.    Lab Results  Component Value Date   WBC 13.8* 10/04/2011   HGB 13.8 10/04/2011   HCT 42.5 10/04/2011   PLT 262 10/04/2011   GLUCOSE 97 04/04/2012   CHOL 175 04/04/2012   TRIG 252.0* 04/04/2012   HDL 40.60 04/04/2012   LDLDIRECT 97.7 04/04/2012   ALT 19 04/04/2012   AST 26 04/04/2012   NA 138 04/04/2012   K 4.5 04/04/2012   CL 104 04/04/2012   CREATININE 1.4 04/04/2012   BUN 15 04/04/2012   CO2 27 04/04/2012   TSH 3.07 06/26/2010   PSA 2.97 06/26/2010   INR 1.00 10/04/2011         Assessment & Plan:

## 2012-04-14 NOTE — Assessment & Plan Note (Signed)
BP Readings from Last 3 Encounters:  04/14/12 130/90  01/14/12 160/70  10/05/11 146/71

## 2012-04-14 NOTE — Assessment & Plan Note (Signed)
Continue with current prescription therapy as reflected on the Med list. Better 

## 2012-04-20 ENCOUNTER — Telehealth: Payer: Self-pay | Admitting: *Deleted

## 2012-04-20 NOTE — Telephone Encounter (Signed)
Androgel PA approved 04/19/12 until 04/19/14. Pharmacy informed.

## 2012-05-08 DIAGNOSIS — J209 Acute bronchitis, unspecified: Secondary | ICD-10-CM | POA: Diagnosis not present

## 2012-05-08 DIAGNOSIS — H6980 Other specified disorders of Eustachian tube, unspecified ear: Secondary | ICD-10-CM | POA: Diagnosis not present

## 2012-05-08 DIAGNOSIS — J019 Acute sinusitis, unspecified: Secondary | ICD-10-CM | POA: Diagnosis not present

## 2012-05-15 DIAGNOSIS — H1130 Conjunctival hemorrhage, unspecified eye: Secondary | ICD-10-CM | POA: Diagnosis not present

## 2012-05-23 DIAGNOSIS — R197 Diarrhea, unspecified: Secondary | ICD-10-CM | POA: Diagnosis not present

## 2012-05-26 DIAGNOSIS — A0472 Enterocolitis due to Clostridium difficile, not specified as recurrent: Secondary | ICD-10-CM | POA: Diagnosis not present

## 2012-05-30 ENCOUNTER — Other Ambulatory Visit: Payer: Self-pay | Admitting: Internal Medicine

## 2012-07-13 ENCOUNTER — Other Ambulatory Visit (INDEPENDENT_AMBULATORY_CARE_PROVIDER_SITE_OTHER): Payer: Medicare Other

## 2012-07-13 DIAGNOSIS — E039 Hypothyroidism, unspecified: Secondary | ICD-10-CM

## 2012-07-13 DIAGNOSIS — E559 Vitamin D deficiency, unspecified: Secondary | ICD-10-CM

## 2012-07-13 DIAGNOSIS — M545 Low back pain, unspecified: Secondary | ICD-10-CM | POA: Diagnosis not present

## 2012-07-13 DIAGNOSIS — I10 Essential (primary) hypertension: Secondary | ICD-10-CM

## 2012-07-13 DIAGNOSIS — F329 Major depressive disorder, single episode, unspecified: Secondary | ICD-10-CM

## 2012-07-13 DIAGNOSIS — N529 Male erectile dysfunction, unspecified: Secondary | ICD-10-CM

## 2012-07-13 DIAGNOSIS — F341 Dysthymic disorder: Secondary | ICD-10-CM

## 2012-07-13 DIAGNOSIS — E785 Hyperlipidemia, unspecified: Secondary | ICD-10-CM | POA: Diagnosis not present

## 2012-07-13 DIAGNOSIS — E538 Deficiency of other specified B group vitamins: Secondary | ICD-10-CM | POA: Diagnosis not present

## 2012-07-13 DIAGNOSIS — R5381 Other malaise: Secondary | ICD-10-CM

## 2012-07-13 DIAGNOSIS — E291 Testicular hypofunction: Secondary | ICD-10-CM

## 2012-07-13 LAB — TESTOSTERONE: Testosterone: 165.43 ng/dL — ABNORMAL LOW (ref 350.00–890.00)

## 2012-07-13 LAB — LIPID PANEL
Total CHOL/HDL Ratio: 7
VLDL: 69.4 mg/dL — ABNORMAL HIGH (ref 0.0–40.0)

## 2012-07-13 LAB — HEPATIC FUNCTION PANEL
AST: 19 U/L (ref 0–37)
Alkaline Phosphatase: 76 U/L (ref 39–117)
Total Bilirubin: 0.7 mg/dL (ref 0.3–1.2)

## 2012-07-14 ENCOUNTER — Ambulatory Visit: Payer: Medicare Other | Admitting: Internal Medicine

## 2012-07-20 ENCOUNTER — Ambulatory Visit (INDEPENDENT_AMBULATORY_CARE_PROVIDER_SITE_OTHER): Payer: Medicare Other | Admitting: Internal Medicine

## 2012-07-20 ENCOUNTER — Encounter: Payer: Self-pay | Admitting: Internal Medicine

## 2012-07-20 VITALS — BP 130/92 | HR 76 | Temp 97.7°F | Resp 16 | Wt 204.0 lb

## 2012-07-20 DIAGNOSIS — I10 Essential (primary) hypertension: Secondary | ICD-10-CM

## 2012-07-20 DIAGNOSIS — F341 Dysthymic disorder: Secondary | ICD-10-CM

## 2012-07-20 DIAGNOSIS — E291 Testicular hypofunction: Secondary | ICD-10-CM

## 2012-07-20 DIAGNOSIS — E538 Deficiency of other specified B group vitamins: Secondary | ICD-10-CM

## 2012-07-20 DIAGNOSIS — F329 Major depressive disorder, single episode, unspecified: Secondary | ICD-10-CM

## 2012-07-20 DIAGNOSIS — R197 Diarrhea, unspecified: Secondary | ICD-10-CM

## 2012-07-20 DIAGNOSIS — E785 Hyperlipidemia, unspecified: Secondary | ICD-10-CM

## 2012-07-20 DIAGNOSIS — N529 Male erectile dysfunction, unspecified: Secondary | ICD-10-CM

## 2012-07-20 MED ORDER — COLESEVELAM HCL 625 MG PO TABS: 1875.0000 mg | ORAL_TABLET | Freq: Two times a day (BID) | ORAL | Status: DC

## 2012-07-20 NOTE — Assessment & Plan Note (Signed)
Continue with current prescription therapy as reflected on the Med list.  

## 2012-07-20 NOTE — Assessment & Plan Note (Signed)
Has not been using Androgel - coverage issues He will re-start if covered IM shots option was discussed

## 2012-07-20 NOTE — Assessment & Plan Note (Signed)
Sx's relapsed, mild Probiotic qd Welchol added

## 2012-07-20 NOTE — Assessment & Plan Note (Addendum)
Worse  Added Welchol Labs in 3 mo

## 2012-07-20 NOTE — Assessment & Plan Note (Signed)
Better Continue with current prescription therapy as reflected on the Med list.  

## 2012-07-20 NOTE — Progress Notes (Signed)
   Subjective:    HPI  The patient presents for a follow-up of  chronic hypertension, chronic dyslipidemia, hypogonadism controlled with medicines, however he had stopped some of them lately. He had L wrist fx in 5/13. C/o diarrhea - he was treated w/tid abx  3-4 wks ago. He got much better - now is having some. He had a stool test. He was told it was colitis. No mucus or blood. He had some mucus in stool before.  F/u on ED  C/o depression - his dad has dementia  Wt Readings from Last 3 Encounters:  07/20/12 204 lb (92.534 kg)  04/14/12 210 lb (95.255 kg)  01/14/12 209 lb (94.802 kg)   BP Readings from Last 3 Encounters:  07/20/12 130/92  04/14/12 130/90  01/14/12 160/70        Review of Systems  Constitutional: Negative for appetite change, fatigue and unexpected weight change.  HENT: Negative for nosebleeds, congestion, sore throat, sneezing, trouble swallowing and neck pain.   Eyes: Negative for itching and visual disturbance.  Respiratory: Negative for cough.   Cardiovascular: Negative for chest pain, palpitations and leg swelling.  Gastrointestinal: Negative for nausea, diarrhea, blood in stool and abdominal distention.  Genitourinary: Negative for frequency and hematuria.  Musculoskeletal: Negative for back pain, joint swelling and gait problem.  Skin: Negative for rash.  Neurological: Negative for dizziness, tremors, speech difficulty and weakness.  Psychiatric/Behavioral: Negative for sleep disturbance, dysphoric mood and agitation. The patient is not nervous/anxious.        Objective:   Physical Exam  Constitutional: He is oriented to person, place, and time. He appears well-developed.  HENT:  Mouth/Throat: Oropharynx is clear and moist.  Eyes: Conjunctivae are normal. Pupils are equal, round, and reactive to light.  Neck: Normal range of motion. No JVD present. No thyromegaly present.  Cardiovascular: Normal rate, regular rhythm, normal heart sounds and  intact distal pulses.  Exam reveals no gallop and no friction rub.   No murmur heard. Pulmonary/Chest: Effort normal and breath sounds normal. No respiratory distress. He has no wheezes. He has no rales. He exhibits no tenderness.  Abdominal: Soft. Bowel sounds are normal. He exhibits no distension and no mass. There is no tenderness. There is no rebound and no guarding.  Musculoskeletal: Normal range of motion. He exhibits tenderness (R heel is tender). He exhibits no edema.  Lymphadenopathy:    He has no cervical adenopathy.  Neurological: He is alert and oriented to person, place, and time. He has normal reflexes. No cranial nerve deficit. He exhibits normal muscle tone. Coordination normal.  Skin: Skin is warm and dry. No rash noted.  Psychiatric: He has a normal mood and affect. His behavior is normal. Judgment and thought content normal.    Lab Results  Component Value Date   WBC 13.8* 10/04/2011   HGB 13.8 10/04/2011   HCT 42.5 10/04/2011   PLT 262 10/04/2011   GLUCOSE 97 04/04/2012   CHOL 206* 07/13/2012   TRIG 347.0* 07/13/2012   HDL 29.60* 07/13/2012   LDLDIRECT 103.5 07/13/2012   ALT 24 07/13/2012   AST 19 07/13/2012   NA 138 04/04/2012   K 4.5 04/04/2012   CL 104 04/04/2012   CREATININE 1.4 04/04/2012   BUN 15 04/04/2012   CO2 27 04/04/2012   TSH 3.07 06/26/2010   PSA 2.97 06/26/2010   INR 1.00 10/04/2011         Assessment & Plan:

## 2012-08-20 ENCOUNTER — Other Ambulatory Visit: Payer: Self-pay | Admitting: Internal Medicine

## 2012-09-10 ENCOUNTER — Other Ambulatory Visit: Payer: Self-pay | Admitting: Internal Medicine

## 2012-09-20 ENCOUNTER — Other Ambulatory Visit: Payer: Self-pay | Admitting: Internal Medicine

## 2012-09-22 DIAGNOSIS — L578 Other skin changes due to chronic exposure to nonionizing radiation: Secondary | ICD-10-CM | POA: Diagnosis not present

## 2012-09-22 DIAGNOSIS — L819 Disorder of pigmentation, unspecified: Secondary | ICD-10-CM | POA: Diagnosis not present

## 2012-10-10 ENCOUNTER — Other Ambulatory Visit (INDEPENDENT_AMBULATORY_CARE_PROVIDER_SITE_OTHER): Payer: Medicare Other

## 2012-10-10 DIAGNOSIS — E538 Deficiency of other specified B group vitamins: Secondary | ICD-10-CM

## 2012-10-10 DIAGNOSIS — F4321 Adjustment disorder with depressed mood: Secondary | ICD-10-CM | POA: Diagnosis not present

## 2012-10-10 DIAGNOSIS — E291 Testicular hypofunction: Secondary | ICD-10-CM | POA: Diagnosis not present

## 2012-10-10 DIAGNOSIS — I10 Essential (primary) hypertension: Secondary | ICD-10-CM

## 2012-10-10 DIAGNOSIS — F329 Major depressive disorder, single episode, unspecified: Secondary | ICD-10-CM

## 2012-10-10 DIAGNOSIS — E785 Hyperlipidemia, unspecified: Secondary | ICD-10-CM | POA: Diagnosis not present

## 2012-10-10 DIAGNOSIS — N529 Male erectile dysfunction, unspecified: Secondary | ICD-10-CM

## 2012-10-10 LAB — LIPID PANEL
LDL Cholesterol: 42 mg/dL (ref 0–99)
Total CHOL/HDL Ratio: 3
Triglycerides: 72 mg/dL (ref 0.0–149.0)

## 2012-10-10 LAB — BASIC METABOLIC PANEL
CO2: 26 mEq/L (ref 19–32)
Chloride: 109 mEq/L (ref 96–112)
Creatinine, Ser: 1.4 mg/dL (ref 0.4–1.5)
Glucose, Bld: 91 mg/dL (ref 70–99)

## 2012-10-20 ENCOUNTER — Encounter: Payer: Self-pay | Admitting: Internal Medicine

## 2012-10-20 ENCOUNTER — Ambulatory Visit (INDEPENDENT_AMBULATORY_CARE_PROVIDER_SITE_OTHER): Payer: Medicare Other | Admitting: Internal Medicine

## 2012-10-20 VITALS — BP 140/96 | HR 76 | Temp 98.1°F | Resp 16 | Wt 207.0 lb

## 2012-10-20 DIAGNOSIS — F329 Major depressive disorder, single episode, unspecified: Secondary | ICD-10-CM

## 2012-10-20 DIAGNOSIS — I10 Essential (primary) hypertension: Secondary | ICD-10-CM

## 2012-10-20 DIAGNOSIS — E538 Deficiency of other specified B group vitamins: Secondary | ICD-10-CM

## 2012-10-20 DIAGNOSIS — E291 Testicular hypofunction: Secondary | ICD-10-CM

## 2012-10-20 DIAGNOSIS — F4321 Adjustment disorder with depressed mood: Secondary | ICD-10-CM | POA: Diagnosis not present

## 2012-10-20 MED ORDER — TESTOSTERONE 20.25 MG/ACT (1.62%) TD GEL: 4.0000 | TRANSDERMAL | Status: DC

## 2012-10-20 MED ORDER — VARDENAFIL HCL 20 MG PO TABS: 20.0000 mg | ORAL_TABLET | Freq: Every day | ORAL | Status: DC | PRN

## 2012-10-20 NOTE — Assessment & Plan Note (Signed)
Continue with current prescription therapy as reflected on the Med list.  

## 2012-10-20 NOTE — Assessment & Plan Note (Signed)
Re-start Rx 

## 2012-10-20 NOTE — Progress Notes (Signed)
   Subjective:    HPI  The patient presents for a follow-up of  chronic hypertension, chronic dyslipidemia, hypogonadism controlled with medicines, however he had stopped some of them lately. He had L wrist fx in 5/13. F/u diarrhea - he was treated w/tid abx  3-4 wks ago. He got much better - now is having none.  He was told it was colitis. No mucus or blood. He had some mucus in stool before. Resolved  F/u on ED  F/u depression - better- his dad has dementia  Wt Readings from Last 3 Encounters:  10/20/12 207 lb (93.895 kg)  07/20/12 204 lb (92.534 kg)  04/14/12 210 lb (95.255 kg)   BP Readings from Last 3 Encounters:  10/20/12 140/96  07/20/12 130/92  04/14/12 130/90        Review of Systems  Constitutional: Negative for appetite change, fatigue and unexpected weight change.  HENT: Negative for nosebleeds, congestion, sore throat, sneezing, trouble swallowing and neck pain.   Eyes: Negative for itching and visual disturbance.  Respiratory: Negative for cough.   Cardiovascular: Negative for chest pain, palpitations and leg swelling.  Gastrointestinal: Negative for nausea, diarrhea, blood in stool and abdominal distention.  Genitourinary: Negative for frequency and hematuria.  Musculoskeletal: Negative for back pain, joint swelling and gait problem.  Skin: Negative for rash.  Neurological: Negative for dizziness, tremors, speech difficulty and weakness.  Psychiatric/Behavioral: Negative for sleep disturbance, dysphoric mood and agitation. The patient is not nervous/anxious.        Objective:   Physical Exam  Constitutional: He is oriented to person, place, and time. He appears well-developed.  HENT:  Mouth/Throat: Oropharynx is clear and moist.  Eyes: Conjunctivae are normal. Pupils are equal, round, and reactive to light.  Neck: Normal range of motion. No JVD present. No thyromegaly present.  Cardiovascular: Normal rate, regular rhythm, normal heart sounds and intact  distal pulses.  Exam reveals no gallop and no friction rub.   No murmur heard. Pulmonary/Chest: Effort normal and breath sounds normal. No respiratory distress. He has no wheezes. He has no rales. He exhibits no tenderness.  Abdominal: Soft. Bowel sounds are normal. He exhibits no distension and no mass. There is no tenderness. There is no rebound and no guarding.  Musculoskeletal: Normal range of motion. He exhibits tenderness (R heel is tender). He exhibits no edema.  Lymphadenopathy:    He has no cervical adenopathy.  Neurological: He is alert and oriented to person, place, and time. He has normal reflexes. No cranial nerve deficit. He exhibits normal muscle tone. Coordination normal.  Skin: Skin is warm and dry. No rash noted.  Psychiatric: He has a normal mood and affect. His behavior is normal. Judgment and thought content normal.    Lab Results  Component Value Date   WBC 13.8* 10/04/2011   HGB 13.8 10/04/2011   HCT 42.5 10/04/2011   PLT 262 10/04/2011   GLUCOSE 91 10/10/2012   CHOL 90 10/10/2012   TRIG 72.0 10/10/2012   HDL 33.50* 10/10/2012   LDLDIRECT 103.5 07/13/2012   LDLCALC 42 10/10/2012   ALT 24 07/13/2012   AST 19 07/13/2012   NA 141 10/10/2012   K 4.6 10/10/2012   CL 109 10/10/2012   CREATININE 1.4 10/10/2012   BUN 14 10/10/2012   CO2 26 10/10/2012   TSH 3.07 06/26/2010   PSA 2.97 06/26/2010   INR 1.00 10/04/2011         Assessment & Plan:

## 2012-10-20 NOTE — Assessment & Plan Note (Signed)
Discussed.

## 2012-10-21 ENCOUNTER — Other Ambulatory Visit: Payer: Self-pay | Admitting: Internal Medicine

## 2012-11-01 ENCOUNTER — Telehealth: Payer: Self-pay | Admitting: *Deleted

## 2012-11-01 NOTE — Telephone Encounter (Signed)
Received fax from [facility] approving medication Androgel. Called pharmacy who stated that they already have approvle in their system.

## 2012-12-11 ENCOUNTER — Other Ambulatory Visit: Payer: Self-pay | Admitting: Internal Medicine

## 2012-12-13 ENCOUNTER — Other Ambulatory Visit: Payer: Self-pay

## 2013-01-22 ENCOUNTER — Ambulatory Visit: Payer: Medicare Other | Admitting: Internal Medicine

## 2013-02-03 ENCOUNTER — Other Ambulatory Visit: Payer: Self-pay | Admitting: Internal Medicine

## 2013-02-03 DIAGNOSIS — J209 Acute bronchitis, unspecified: Secondary | ICD-10-CM | POA: Diagnosis not present

## 2013-02-03 DIAGNOSIS — J111 Influenza due to unidentified influenza virus with other respiratory manifestations: Secondary | ICD-10-CM | POA: Diagnosis not present

## 2013-02-12 ENCOUNTER — Other Ambulatory Visit: Payer: Self-pay | Admitting: *Deleted

## 2013-02-12 ENCOUNTER — Other Ambulatory Visit: Payer: Medicare Other

## 2013-02-12 DIAGNOSIS — I10 Essential (primary) hypertension: Secondary | ICD-10-CM

## 2013-02-12 DIAGNOSIS — E785 Hyperlipidemia, unspecified: Secondary | ICD-10-CM

## 2013-02-12 DIAGNOSIS — E291 Testicular hypofunction: Secondary | ICD-10-CM

## 2013-02-19 ENCOUNTER — Ambulatory Visit: Payer: Medicare Other | Admitting: Internal Medicine

## 2013-02-19 ENCOUNTER — Ambulatory Visit (INDEPENDENT_AMBULATORY_CARE_PROVIDER_SITE_OTHER)
Admission: RE | Admit: 2013-02-19 | Discharge: 2013-02-19 | Disposition: A | Discharge status: Home / Self Care | Payer: Medicare Other | Source: Ambulatory Visit | Attending: Internal Medicine | Admitting: Internal Medicine

## 2013-02-19 ENCOUNTER — Ambulatory Visit (INDEPENDENT_AMBULATORY_CARE_PROVIDER_SITE_OTHER): Payer: Medicare Other | Admitting: Internal Medicine

## 2013-02-19 ENCOUNTER — Other Ambulatory Visit (INDEPENDENT_AMBULATORY_CARE_PROVIDER_SITE_OTHER): Payer: Medicare Other

## 2013-02-19 ENCOUNTER — Encounter: Payer: Self-pay | Admitting: Internal Medicine

## 2013-02-19 VITALS — BP 130/74 | HR 68 | Temp 98.7°F | Resp 16 | Wt 211.0 lb

## 2013-02-19 DIAGNOSIS — E785 Hyperlipidemia, unspecified: Secondary | ICD-10-CM | POA: Diagnosis not present

## 2013-02-19 DIAGNOSIS — Z23 Encounter for immunization: Secondary | ICD-10-CM | POA: Diagnosis not present

## 2013-02-19 DIAGNOSIS — I10 Essential (primary) hypertension: Secondary | ICD-10-CM

## 2013-02-19 DIAGNOSIS — J209 Acute bronchitis, unspecified: Secondary | ICD-10-CM

## 2013-02-19 DIAGNOSIS — J40 Bronchitis, not specified as acute or chronic: Secondary | ICD-10-CM | POA: Diagnosis not present

## 2013-02-19 DIAGNOSIS — E538 Deficiency of other specified B group vitamins: Secondary | ICD-10-CM

## 2013-02-19 DIAGNOSIS — E291 Testicular hypofunction: Secondary | ICD-10-CM

## 2013-02-19 DIAGNOSIS — N189 Chronic kidney disease, unspecified: Secondary | ICD-10-CM

## 2013-02-19 DIAGNOSIS — D751 Secondary polycythemia: Secondary | ICD-10-CM

## 2013-02-19 LAB — BASIC METABOLIC PANEL
BUN: 26 mg/dL — ABNORMAL HIGH (ref 6–23)
CO2: 27 mEq/L (ref 19–32)
Calcium: 9.4 mg/dL (ref 8.4–10.5)
Chloride: 105 mEq/L (ref 96–112)
Creatinine, Ser: 2.5 mg/dL — ABNORMAL HIGH (ref 0.4–1.5)
GFR: 28.24 mL/min — ABNORMAL LOW (ref >60.00)
Glucose, Bld: 101 mg/dL — ABNORMAL HIGH (ref 70–99)
Potassium: 5.9 mEq/L — ABNORMAL HIGH (ref 3.5–5.1)
Sodium: 141 mEq/L (ref 135–145)

## 2013-02-19 LAB — LIPID PANEL
Cholesterol: 299 mg/dL — ABNORMAL HIGH (ref 0–200)
HDL: 42.2 mg/dL (ref >39.00)
Total CHOL/HDL Ratio: 7
Triglycerides: 520 mg/dL — ABNORMAL HIGH (ref 0.0–149.0)
VLDL: 104 mg/dL — ABNORMAL HIGH (ref 0.0–40.0)

## 2013-02-19 LAB — TESTOSTERONE: Testosterone: 246.88 ng/dL — ABNORMAL LOW (ref 350.00–890.00)

## 2013-02-19 LAB — CBC WITH DIFFERENTIAL/PLATELET
Basophils Absolute: 0 10*3/uL (ref 0.0–0.1)
Eosinophils Absolute: 0.1 10*3/uL (ref 0.0–0.7)
Eosinophils Relative: 0.8 % (ref 0.0–5.0)
Hemoglobin: 17.3 g/dL — ABNORMAL HIGH (ref 13.0–17.0)
Lymphocytes Relative: 18.2 % (ref 12.0–46.0)
MCHC: 34.3 g/dL (ref 30.0–36.0)
Monocytes Absolute: 0.6 10*3/uL (ref 0.1–1.0)
Neutro Abs: 7.7 10*3/uL (ref 1.4–7.7)
RDW: 14.2 % (ref 11.5–14.6)

## 2013-02-19 NOTE — Patient Instructions (Signed)

## 2013-02-19 NOTE — Assessment & Plan Note (Signed)
CXR

## 2013-02-19 NOTE — Assessment & Plan Note (Signed)
BP Readings from Last 3 Encounters:  02/19/13 130/74  10/20/12 140/96  07/20/12 130/92

## 2013-02-19 NOTE — Progress Notes (Signed)
   Subjective:    HPI  The patient presents for a follow-up of  chronic hypertension, chronic dyslipidemia, hypogonadism controlled with medicines, however he had stopped some of them lately. He had L wrist fx in 5/13.  C/o crestor and testosterone too $$$ - he stopped both temporaroly  F/u on ED  F/u depression - better- his dad has dementia  Wt Readings from Last 3 Encounters:  02/19/13 211 lb (95.709 kg)  10/20/12 207 lb (93.895 kg)  07/20/12 204 lb (92.534 kg)   BP Readings from Last 3 Encounters:  02/19/13 130/74  10/20/12 140/96  07/20/12 130/92    Review of Systems  Constitutional: Negative for appetite change, fatigue and unexpected weight change.  HENT: Negative for congestion, nosebleeds, sneezing, sore throat and trouble swallowing.   Eyes: Negative for itching and visual disturbance.  Respiratory: Negative for cough.   Cardiovascular: Negative for chest pain, palpitations and leg swelling.  Gastrointestinal: Negative for nausea, diarrhea, blood in stool and abdominal distention.  Genitourinary: Negative for frequency and hematuria.  Musculoskeletal: Negative for back pain, gait problem, joint swelling and neck pain.  Skin: Negative for rash.  Neurological: Negative for dizziness, tremors, speech difficulty and weakness.  Psychiatric/Behavioral: Negative for sleep disturbance, dysphoric mood and agitation. The patient is not nervous/anxious.        Objective:   Physical Exam  Constitutional: He is oriented to person, place, and time. He appears well-developed.  HENT:  Mouth/Throat: Oropharynx is clear and moist.  Eyes: Conjunctivae are normal. Pupils are equal, round, and reactive to light.  Neck: Normal range of motion. No JVD present. No thyromegaly present.  Cardiovascular: Normal rate, regular rhythm, normal heart sounds and intact distal pulses.  Exam reveals no gallop and no friction rub.   No murmur heard. Pulmonary/Chest: Effort normal and breath  sounds normal. No respiratory distress. He has no wheezes. He has no rales. He exhibits no tenderness.  Abdominal: Soft. Bowel sounds are normal. He exhibits no distension and no mass. There is no tenderness. There is no rebound and no guarding.  Musculoskeletal: Normal range of motion. He exhibits no edema and no tenderness.  Lymphadenopathy:    He has no cervical adenopathy.  Neurological: He is alert and oriented to person, place, and time. He has normal reflexes. No cranial nerve deficit. He exhibits normal muscle tone. Coordination normal.  Skin: Skin is warm and dry. No rash noted.  Psychiatric: He has a normal mood and affect. His behavior is normal. Judgment and thought content normal.    Lab Results  Component Value Date   WBC 13.8* 10/04/2011   HGB 13.8 10/04/2011   HCT 42.5 10/04/2011   PLT 262 10/04/2011   GLUCOSE 91 10/10/2012   CHOL 90 10/10/2012   TRIG 72.0 10/10/2012   HDL 33.50* 10/10/2012   LDLDIRECT 103.5 07/13/2012   LDLCALC 42 10/10/2012   ALT 24 07/13/2012   AST 19 07/13/2012   NA 141 10/10/2012   K 4.6 10/10/2012   CL 109 10/10/2012   CREATININE 1.4 10/10/2012   BUN 14 10/10/2012   CO2 26 10/10/2012   TSH 3.07 06/26/2010   PSA 2.97 06/26/2010   INR 1.00 10/04/2011         Assessment & Plan:

## 2013-02-20 DIAGNOSIS — N189 Chronic kidney disease, unspecified: Secondary | ICD-10-CM | POA: Insufficient documentation

## 2013-02-20 DIAGNOSIS — D751 Secondary polycythemia: Secondary | ICD-10-CM | POA: Insufficient documentation

## 2013-02-28 NOTE — Assessment & Plan Note (Signed)
See med change 

## 2013-02-28 NOTE — Assessment & Plan Note (Signed)
Continue with current prescription therapy as reflected on the Med list.  

## 2013-03-02 ENCOUNTER — Telehealth: Payer: Self-pay | Admitting: *Deleted

## 2013-03-02 NOTE — Telephone Encounter (Signed)
Message copied by Merrilyn Puma on Fri Mar 02, 2013  8:51 AM ------      Message from: Tresa Garter      Created: Wed Feb 28, 2013 10:38 PM       Misty Stanley, please, inform patient that he needs to repeat labs this or next wk      Thx       ------

## 2013-03-02 NOTE — Telephone Encounter (Signed)
Pt's wife informed of below.  

## 2013-03-09 ENCOUNTER — Other Ambulatory Visit (INDEPENDENT_AMBULATORY_CARE_PROVIDER_SITE_OTHER): Payer: Medicare Other

## 2013-03-09 DIAGNOSIS — D751 Secondary polycythemia: Secondary | ICD-10-CM

## 2013-03-09 DIAGNOSIS — I10 Essential (primary) hypertension: Secondary | ICD-10-CM

## 2013-03-09 LAB — BASIC METABOLIC PANEL
Calcium: 9.2 mg/dL (ref 8.4–10.5)
Creatinine, Ser: 1.3 mg/dL (ref 0.4–1.5)
GFR: 57.15 mL/min — ABNORMAL LOW (ref >60.00)
Potassium: 4.7 mEq/L (ref 3.5–5.1)

## 2013-03-09 LAB — CBC WITH DIFFERENTIAL/PLATELET
Basophils Absolute: 0 10*3/uL (ref 0.0–0.1)
Eosinophils Absolute: 0.2 10*3/uL (ref 0.0–0.7)
HCT: 48.2 % (ref 39.0–52.0)
Hemoglobin: 16.3 g/dL (ref 13.0–17.0)
Lymphs Abs: 1.9 10*3/uL (ref 0.7–4.0)
MCHC: 33.9 g/dL (ref 30.0–36.0)
Neutro Abs: 5.9 10*3/uL (ref 1.4–7.7)
Neutrophils Relative %: 69.2 % (ref 43.0–77.0)
Platelets: 233 10*3/uL (ref 150.0–400.0)
RDW: 13.9 % (ref 11.5–14.6)
WBC: 8.5 10*3/uL (ref 4.5–10.5)

## 2013-03-23 DIAGNOSIS — J069 Acute upper respiratory infection, unspecified: Secondary | ICD-10-CM | POA: Diagnosis not present

## 2013-03-26 DIAGNOSIS — L821 Other seborrheic keratosis: Secondary | ICD-10-CM | POA: Diagnosis not present

## 2013-03-26 DIAGNOSIS — D235 Other benign neoplasm of skin of trunk: Secondary | ICD-10-CM | POA: Diagnosis not present

## 2013-03-26 DIAGNOSIS — L578 Other skin changes due to chronic exposure to nonionizing radiation: Secondary | ICD-10-CM | POA: Diagnosis not present

## 2013-03-26 DIAGNOSIS — L570 Actinic keratosis: Secondary | ICD-10-CM | POA: Diagnosis not present

## 2013-04-18 ENCOUNTER — Other Ambulatory Visit: Payer: Self-pay | Admitting: Internal Medicine

## 2013-05-14 IMAGING — CR DG CHEST 2V
2 series · 2 of 2 positions shown · non-contrast
Comparison: 10/27/2010

CLINICAL DATA: Left wrist fracture.  Hypertension and asthma.
Preop respiratory exam.

CHEST - 2 VIEW

[w chest lat]
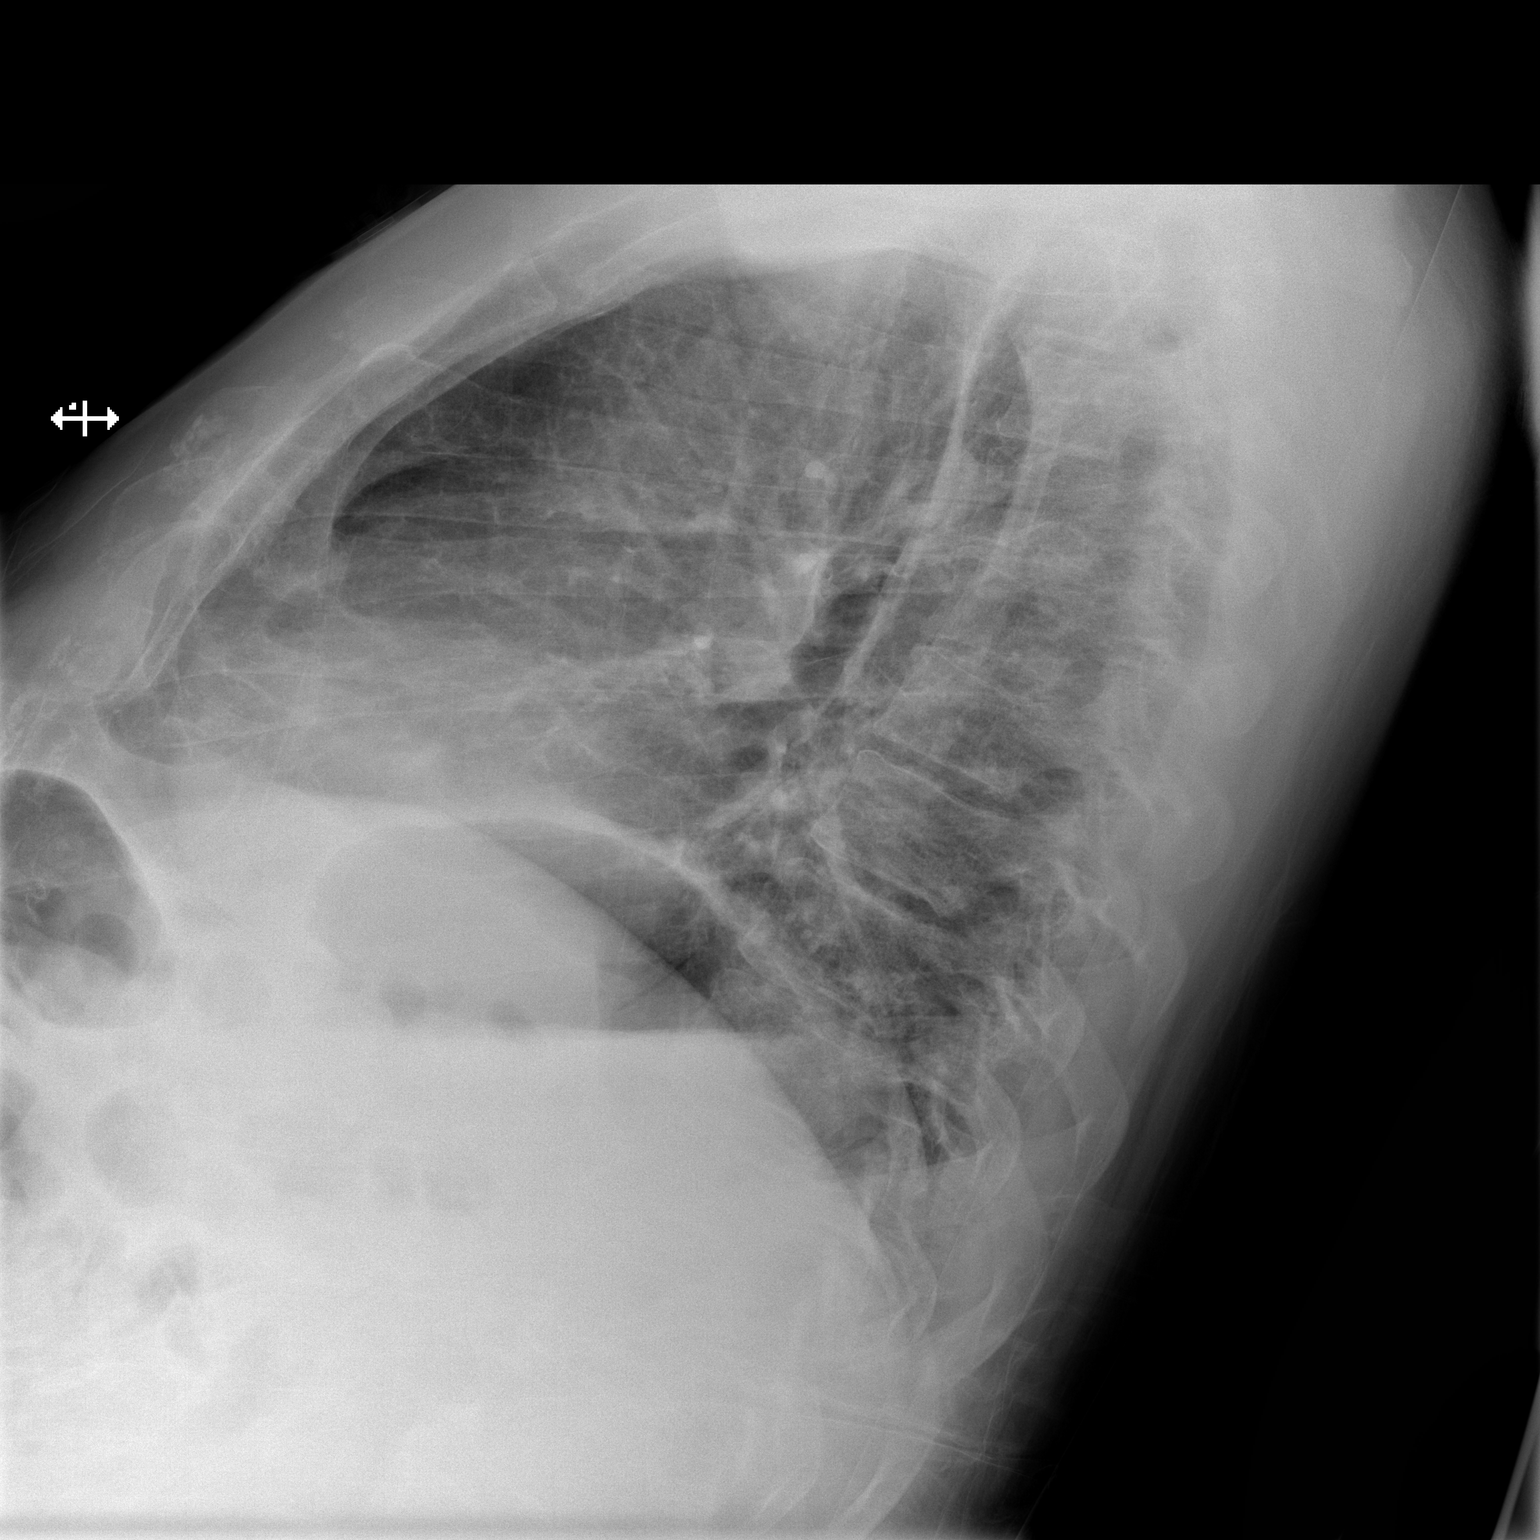

[x chest ap]
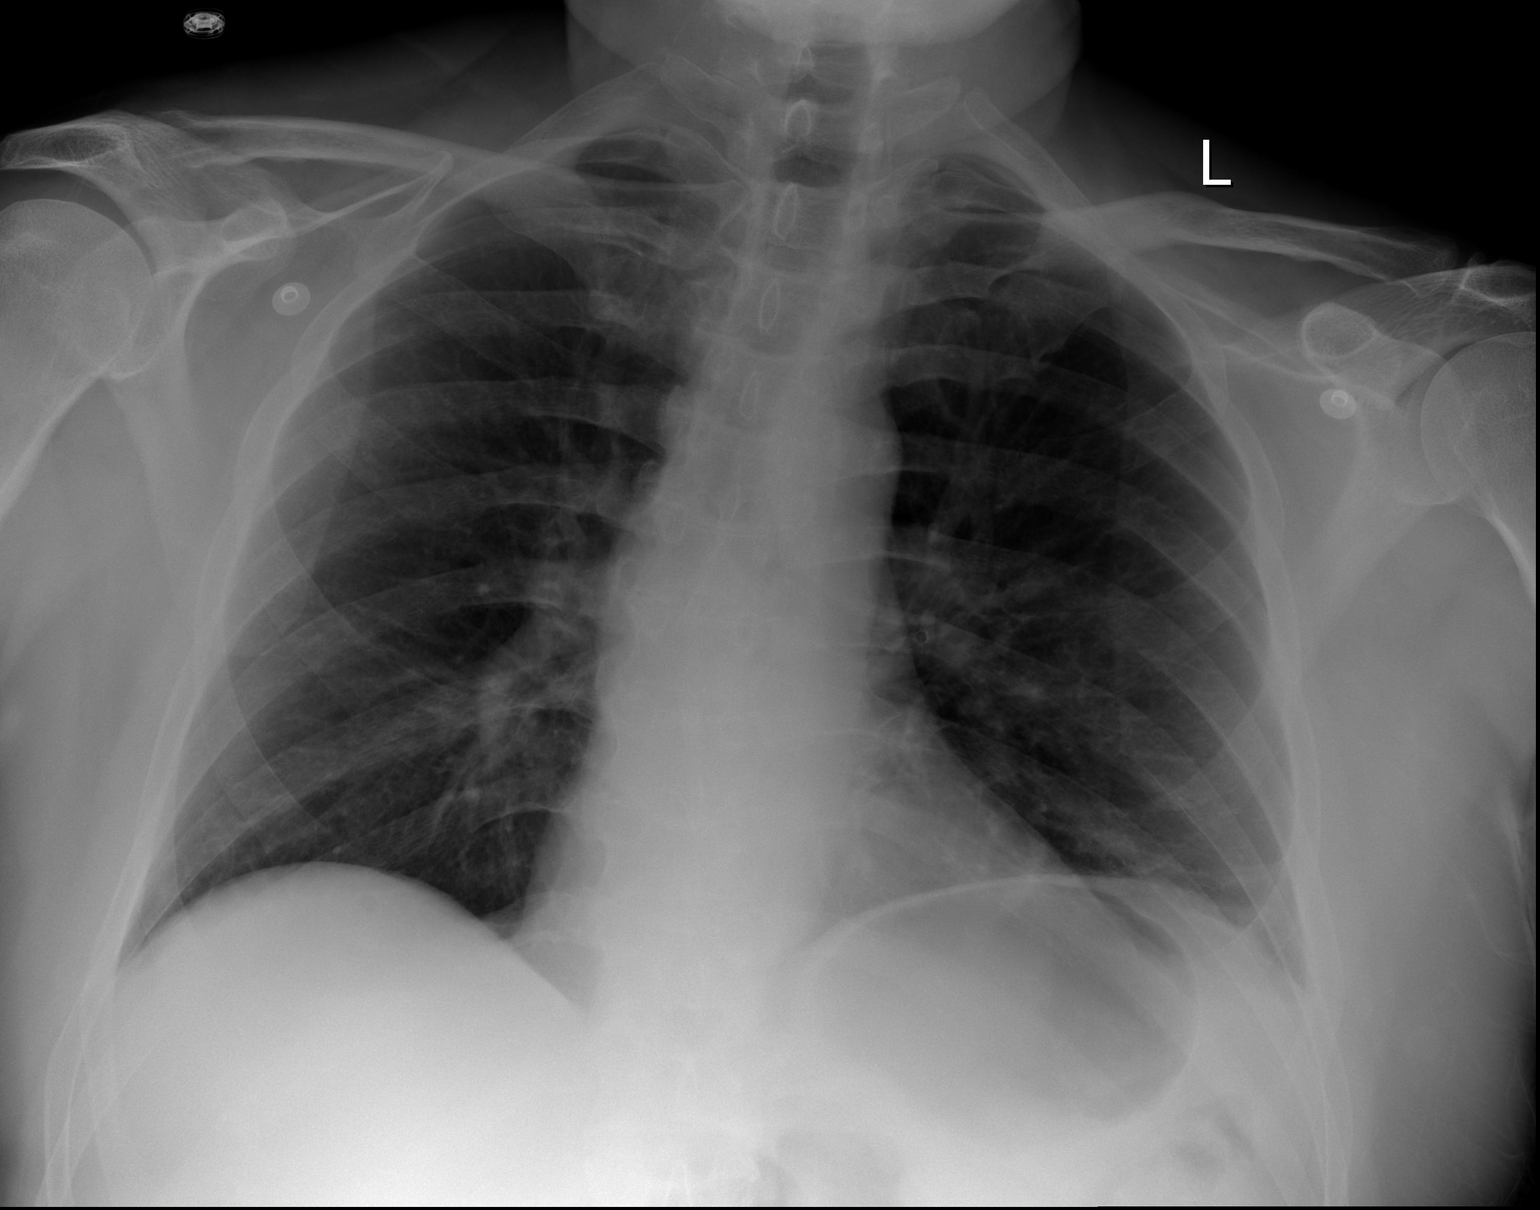

[2 of 2 positions shown; findings below may reference images not displayed]

FINDINGS: Mild scarring again seen in the left lung base.  Lungs
otherwise clear.  No evidence of pleural effusion.  Heart size is
within normal limits.
IMPRESSION: Stable mild left basilar scarring.  No active disease.

## 2013-05-21 ENCOUNTER — Ambulatory Visit: Payer: Medicare Other | Admitting: Internal Medicine

## 2013-06-11 ENCOUNTER — Other Ambulatory Visit (INDEPENDENT_AMBULATORY_CARE_PROVIDER_SITE_OTHER): Payer: Medicare Other

## 2013-06-11 ENCOUNTER — Other Ambulatory Visit: Payer: Self-pay | Admitting: *Deleted

## 2013-06-11 DIAGNOSIS — E291 Testicular hypofunction: Secondary | ICD-10-CM | POA: Diagnosis not present

## 2013-06-11 DIAGNOSIS — I10 Essential (primary) hypertension: Secondary | ICD-10-CM

## 2013-06-11 DIAGNOSIS — E039 Hypothyroidism, unspecified: Secondary | ICD-10-CM

## 2013-06-11 DIAGNOSIS — E785 Hyperlipidemia, unspecified: Secondary | ICD-10-CM

## 2013-06-11 LAB — CBC WITH DIFFERENTIAL/PLATELET
Basophils Absolute: 0.1 10*3/uL (ref 0.0–0.1)
Basophils Relative: 0.8 % (ref 0.0–3.0)
EOS ABS: 0.2 10*3/uL (ref 0.0–0.7)
Eosinophils Relative: 3 % (ref 0.0–5.0)
HEMATOCRIT: 47.3 % (ref 39.0–52.0)
Hemoglobin: 15.5 g/dL (ref 13.0–17.0)
LYMPHS ABS: 2.1 10*3/uL (ref 0.7–4.0)
Lymphocytes Relative: 28.8 % (ref 12.0–46.0)
MCHC: 32.7 g/dL (ref 30.0–36.0)
MCV: 99.7 fl (ref 78.0–100.0)
Monocytes Absolute: 0.5 10*3/uL (ref 0.1–1.0)
Monocytes Relative: 6.3 % (ref 3.0–12.0)
Neutro Abs: 4.4 10*3/uL (ref 1.4–7.7)
Neutrophils Relative %: 61.1 % (ref 43.0–77.0)
Platelets: 290 10*3/uL (ref 150.0–400.0)
RBC: 4.74 Mil/uL (ref 4.22–5.81)
RDW: 13.9 % (ref 11.5–14.6)
WBC: 7.2 10*3/uL (ref 4.5–10.5)

## 2013-06-11 LAB — BASIC METABOLIC PANEL
BUN: 17 mg/dL (ref 6–23)
CO2: 27 meq/L (ref 19–32)
CREATININE: 1.4 mg/dL (ref 0.4–1.5)
Calcium: 9.3 mg/dL (ref 8.4–10.5)
Chloride: 107 mEq/L (ref 96–112)
GFR: 52.1 mL/min — ABNORMAL LOW (ref >60.00)
Glucose, Bld: 98 mg/dL (ref 70–99)
Potassium: 5.3 mEq/L — ABNORMAL HIGH (ref 3.5–5.1)
Sodium: 141 mEq/L (ref 135–145)

## 2013-06-11 LAB — TESTOSTERONE: Testosterone: 122.51 ng/dL — ABNORMAL LOW (ref 350.00–890.00)

## 2013-06-11 LAB — LIPID PANEL
CHOLESTEROL: 268 mg/dL — AB (ref 0–200)
HDL: 38.6 mg/dL — ABNORMAL LOW (ref >39.00)
Total CHOL/HDL Ratio: 7
Triglycerides: 493 mg/dL — ABNORMAL HIGH (ref 0.0–149.0)
VLDL: 98.6 mg/dL — ABNORMAL HIGH (ref 0.0–40.0)

## 2013-06-11 LAB — LDL CHOLESTEROL, DIRECT: Direct LDL: 120.9 mg/dL

## 2013-06-11 LAB — TSH: TSH: 20.84 u[IU]/mL — ABNORMAL HIGH (ref 0.35–5.50)

## 2013-06-22 ENCOUNTER — Ambulatory Visit (INDEPENDENT_AMBULATORY_CARE_PROVIDER_SITE_OTHER): Payer: Medicare Other | Admitting: Internal Medicine

## 2013-06-22 ENCOUNTER — Encounter: Payer: Self-pay | Admitting: Internal Medicine

## 2013-06-22 VITALS — BP 156/90 | HR 76 | Temp 97.7°F | Resp 16 | Wt 218.0 lb

## 2013-06-22 DIAGNOSIS — E039 Hypothyroidism, unspecified: Secondary | ICD-10-CM

## 2013-06-22 DIAGNOSIS — E875 Hyperkalemia: Secondary | ICD-10-CM

## 2013-06-22 DIAGNOSIS — E559 Vitamin D deficiency, unspecified: Secondary | ICD-10-CM

## 2013-06-22 DIAGNOSIS — E538 Deficiency of other specified B group vitamins: Secondary | ICD-10-CM

## 2013-06-22 DIAGNOSIS — E291 Testicular hypofunction: Secondary | ICD-10-CM

## 2013-06-22 DIAGNOSIS — N529 Male erectile dysfunction, unspecified: Secondary | ICD-10-CM | POA: Diagnosis not present

## 2013-06-22 DIAGNOSIS — E785 Hyperlipidemia, unspecified: Secondary | ICD-10-CM

## 2013-06-22 DIAGNOSIS — I10 Essential (primary) hypertension: Secondary | ICD-10-CM

## 2013-06-22 DIAGNOSIS — D751 Secondary polycythemia: Secondary | ICD-10-CM

## 2013-06-22 MED ORDER — ATORVASTATIN CALCIUM 20 MG PO TABS: 20.0000 mg | ORAL_TABLET | Freq: Every day | ORAL | Status: DC

## 2013-06-22 MED ORDER — LEVOTHYROXINE SODIUM 175 MCG PO TABS: 175.0000 ug | ORAL_TABLET | Freq: Every day | ORAL | Status: DC

## 2013-06-22 NOTE — Assessment & Plan Note (Signed)
labs

## 2013-06-22 NOTE — Progress Notes (Signed)
Pre visit review using our clinic review tool, if applicable. No additional management support is needed unless otherwise documented below in the visit note. 

## 2013-06-22 NOTE — Assessment & Plan Note (Signed)
Continue with current prescription therapy as reflected on the Med list.  

## 2013-06-22 NOTE — Progress Notes (Signed)
Patient ID: Donald Phillips, male   DOB: 16-Aug-1945, 68 y.o.   MRN: [id number]   Subjective:    HPI  The patient presents for a follow-up of  chronic hypertension, chronic dyslipidemia, hypogonadism controlled with medicines, however he had stopped some of them lately. He had L wrist fx in 5/13.  C/o crestor and testosterone too $$$ - he stopped both temporarily again  F/u on ED  F/u depression - better- his dad has dementia  Wt Readings from Last 3 Encounters:  06/22/13 218 lb (98.884 kg)  02/19/13 211 lb (95.709 kg)  10/20/12 207 lb (93.895 kg)   BP Readings from Last 3 Encounters:  06/22/13 156/90  02/19/13 130/74  10/20/12 140/96    Review of Systems  Constitutional: Negative for appetite change, fatigue and unexpected weight change.  HENT: Negative for congestion, nosebleeds, sneezing, sore throat and trouble swallowing.   Eyes: Negative for itching and visual disturbance.  Respiratory: Negative for cough.   Cardiovascular: Negative for chest pain, palpitations and leg swelling.  Gastrointestinal: Negative for nausea, diarrhea, blood in stool and abdominal distention.  Genitourinary: Negative for frequency and hematuria.  Musculoskeletal: Negative for back pain, gait problem, joint swelling and neck pain.  Skin: Negative for rash.  Neurological: Negative for dizziness, tremors, speech difficulty and weakness.  Psychiatric/Behavioral: Negative for sleep disturbance, dysphoric mood and agitation. The patient is not nervous/anxious.        Objective:   Physical Exam  Constitutional: He is oriented to person, place, and time. He appears well-developed.  HENT:  Mouth/Throat: Oropharynx is clear and moist.  Eyes: Conjunctivae are normal. Pupils are equal, round, and reactive to light.  Neck: Normal range of motion. No JVD present. No thyromegaly present.  Cardiovascular: Normal rate, regular rhythm, normal heart sounds and intact distal pulses.  Exam reveals no gallop  and no friction rub.   No murmur heard. Pulmonary/Chest: Effort normal and breath sounds normal. No respiratory distress. He has no wheezes. He has no rales. He exhibits no tenderness.  Abdominal: Soft. Bowel sounds are normal. He exhibits no distension and no mass. There is no tenderness. There is no rebound and no guarding.  Musculoskeletal: Normal range of motion. He exhibits no edema and no tenderness.  Lymphadenopathy:    He has no cervical adenopathy.  Neurological: He is alert and oriented to person, place, and time. He has normal reflexes. No cranial nerve deficit. He exhibits normal muscle tone. Coordination normal.  Skin: Skin is warm and dry. No rash noted.  Psychiatric: He has a normal mood and affect. His behavior is normal. Judgment and thought content normal.    Lab Results  Component Value Date   WBC 7.2 06/11/2013   HGB 15.5 06/11/2013   HCT 47.3 06/11/2013   PLT 290.0 06/11/2013   GLUCOSE 98 06/11/2013   CHOL 268* 06/11/2013   TRIG 493.0* 06/11/2013   HDL 38.60* 06/11/2013   LDLDIRECT 120.9 06/11/2013   LDLCALC 42 10/10/2012   ALT 24 07/13/2012   AST 19 07/13/2012   NA 141 06/11/2013   K 5.3* 06/11/2013   CL 107 06/11/2013   CREATININE 1.4 06/11/2013   BUN 17 06/11/2013   CO2 27 06/11/2013   TSH 20.84* 06/11/2013   PSA 2.97 06/26/2010   INR 1.00 10/04/2011         Assessment & Plan:

## 2013-06-22 NOTE — Assessment & Plan Note (Addendum)
Too $$$$ See instructions

## 2013-06-22 NOTE — Assessment & Plan Note (Signed)
Change to Lipitor

## 2013-06-22 NOTE — Assessment & Plan Note (Signed)
Repeat labs:

## 2013-06-22 NOTE — Assessment & Plan Note (Addendum)
Continue with current prescription therapy as reflected on the Med list. TSH 20 (2/15) -- increased dose Labs

## 2013-06-22 NOTE — Patient Instructions (Signed)

## 2013-08-24 ENCOUNTER — Other Ambulatory Visit: Payer: Self-pay | Admitting: Internal Medicine

## 2013-09-17 ENCOUNTER — Other Ambulatory Visit (INDEPENDENT_AMBULATORY_CARE_PROVIDER_SITE_OTHER): Payer: Medicare Other

## 2013-09-17 DIAGNOSIS — E559 Vitamin D deficiency, unspecified: Secondary | ICD-10-CM

## 2013-09-17 DIAGNOSIS — E875 Hyperkalemia: Secondary | ICD-10-CM

## 2013-09-17 DIAGNOSIS — N529 Male erectile dysfunction, unspecified: Secondary | ICD-10-CM | POA: Diagnosis not present

## 2013-09-17 DIAGNOSIS — E785 Hyperlipidemia, unspecified: Secondary | ICD-10-CM

## 2013-09-17 DIAGNOSIS — I10 Essential (primary) hypertension: Secondary | ICD-10-CM

## 2013-09-17 DIAGNOSIS — E291 Testicular hypofunction: Secondary | ICD-10-CM

## 2013-09-17 DIAGNOSIS — E039 Hypothyroidism, unspecified: Secondary | ICD-10-CM | POA: Diagnosis not present

## 2013-09-17 DIAGNOSIS — D751 Secondary polycythemia: Secondary | ICD-10-CM

## 2013-09-17 DIAGNOSIS — E538 Deficiency of other specified B group vitamins: Secondary | ICD-10-CM

## 2013-09-17 LAB — BASIC METABOLIC PANEL
BUN: 18 mg/dL (ref 6–23)
CALCIUM: 9.5 mg/dL (ref 8.4–10.5)
CO2: 27 meq/L (ref 19–32)
Chloride: 109 mEq/L (ref 96–112)
Creatinine, Ser: 1.3 mg/dL (ref 0.4–1.5)
GFR: 59.64 mL/min — AB (ref >60.00)
Glucose, Bld: 96 mg/dL (ref 70–99)
Potassium: 4.9 mEq/L (ref 3.5–5.1)
SODIUM: 143 meq/L (ref 135–145)

## 2013-09-17 LAB — CBC WITH DIFFERENTIAL/PLATELET
Basophils Absolute: 0.1 10*3/uL (ref 0.0–0.1)
Basophils Relative: 0.7 % (ref 0.0–3.0)
EOS ABS: 0.1 10*3/uL (ref 0.0–0.7)
Eosinophils Relative: 1.2 % (ref 0.0–5.0)
HCT: 45.9 % (ref 39.0–52.0)
Hemoglobin: 15.5 g/dL (ref 13.0–17.0)
LYMPHS ABS: 1.8 10*3/uL (ref 0.7–4.0)
Lymphocytes Relative: 19.5 % (ref 12.0–46.0)
MCHC: 33.8 g/dL (ref 30.0–36.0)
MCV: 93.4 fl (ref 78.0–100.0)
MONOS PCT: 6 % (ref 3.0–12.0)
Monocytes Absolute: 0.6 10*3/uL (ref 0.1–1.0)
Neutro Abs: 6.7 10*3/uL (ref 1.4–7.7)
Neutrophils Relative %: 72.6 % (ref 43.0–77.0)
PLATELETS: 268 10*3/uL (ref 150.0–400.0)
RBC: 4.91 Mil/uL (ref 4.22–5.81)
RDW: 13.4 % (ref 11.5–15.5)
WBC: 9.2 10*3/uL (ref 4.0–10.5)

## 2013-09-17 LAB — HEPATIC FUNCTION PANEL
ALT: 17 U/L (ref 0–53)
AST: 22 U/L (ref 0–37)
Albumin: 4.1 g/dL (ref 3.5–5.2)
Alkaline Phosphatase: 79 U/L (ref 39–117)
BILIRUBIN TOTAL: 1 mg/dL (ref 0.2–1.2)
Bilirubin, Direct: 0 mg/dL (ref 0.0–0.3)
Total Protein: 7.2 g/dL (ref 6.0–8.3)

## 2013-09-17 LAB — LIPID PANEL
CHOLESTEROL: 274 mg/dL — AB (ref 0–200)
HDL: 40.5 mg/dL (ref >39.00)
LDL Cholesterol: 146 mg/dL — ABNORMAL HIGH (ref 0–99)
TRIGLYCERIDES: 440 mg/dL — AB (ref 0.0–149.0)
Total CHOL/HDL Ratio: 7
VLDL: 88 mg/dL — AB (ref 0.0–40.0)

## 2013-09-17 LAB — TSH: TSH: 0.05 u[IU]/mL — AB (ref 0.35–4.50)

## 2013-09-17 LAB — T4, FREE: FREE T4: 2.19 ng/dL — AB (ref 0.60–1.60)

## 2013-09-21 ENCOUNTER — Encounter: Payer: Self-pay | Admitting: Internal Medicine

## 2013-09-21 ENCOUNTER — Ambulatory Visit (INDEPENDENT_AMBULATORY_CARE_PROVIDER_SITE_OTHER): Payer: Medicare Other | Admitting: Internal Medicine

## 2013-09-21 VITALS — BP 148/78 | HR 72 | Temp 98.2°F | Resp 16 | Wt 215.0 lb

## 2013-09-21 DIAGNOSIS — D751 Secondary polycythemia: Secondary | ICD-10-CM | POA: Diagnosis not present

## 2013-09-21 DIAGNOSIS — E785 Hyperlipidemia, unspecified: Secondary | ICD-10-CM | POA: Diagnosis not present

## 2013-09-21 DIAGNOSIS — E039 Hypothyroidism, unspecified: Secondary | ICD-10-CM

## 2013-09-21 DIAGNOSIS — E291 Testicular hypofunction: Secondary | ICD-10-CM | POA: Diagnosis not present

## 2013-09-21 MED ORDER — LEVOTHYROXINE SODIUM 175 MCG PO TABS: 87.5000 ug | ORAL_TABLET | Freq: Every day | ORAL | Status: DC

## 2013-09-21 MED ORDER — ATORVASTATIN CALCIUM 20 MG PO TABS: 20.0000 mg | ORAL_TABLET | Freq: Every day | ORAL | Status: DC

## 2013-09-21 NOTE — Assessment & Plan Note (Signed)
Not on Rx 

## 2013-09-21 NOTE — Assessment & Plan Note (Signed)
CBC

## 2013-09-21 NOTE — Assessment & Plan Note (Signed)
Reduce Levoxyl to 175 mcg 0.5 tab a day FT4 in 3 mo

## 2013-09-21 NOTE — Progress Notes (Signed)
Pre visit review using our clinic review tool, if applicable. No additional management support is needed unless otherwise documented below in the visit note. 

## 2013-09-21 NOTE — Assessment & Plan Note (Signed)
Re-start Lipitor 

## 2013-09-21 NOTE — Progress Notes (Signed)
   Subjective:    HPI  The patient presents for a follow-up of  chronic hypertension, chronic dyslipidemia, hypogonadism controlled with medicines, however he had stopped some of them lately. He had L wrist fx in 5/13.  Crestor and testosterone too $$$ - he stopped both temporarily again  F/u on ED  F/u depression - better- his dad has dementia  Wt Readings from Last 3 Encounters:  09/21/13 215 lb (97.523 kg)  06/22/13 218 lb (98.884 kg)  02/19/13 211 lb (95.709 kg)   BP Readings from Last 3 Encounters:  09/21/13 148/78  06/22/13 156/90  02/19/13 130/74    Review of Systems  Constitutional: Negative for appetite change, fatigue and unexpected weight change.  HENT: Negative for congestion, nosebleeds, sneezing, sore throat and trouble swallowing.   Eyes: Negative for itching and visual disturbance.  Respiratory: Negative for cough.   Cardiovascular: Negative for chest pain, palpitations and leg swelling.  Gastrointestinal: Negative for nausea, diarrhea, blood in stool and abdominal distention.  Genitourinary: Negative for frequency and hematuria.  Musculoskeletal: Negative for back pain, gait problem, joint swelling and neck pain.  Skin: Negative for rash.  Neurological: Negative for dizziness, tremors, speech difficulty and weakness.  Psychiatric/Behavioral: Negative for sleep disturbance, dysphoric mood and agitation. The patient is not nervous/anxious.        Objective:   Physical Exam  Constitutional: He is oriented to person, place, and time. He appears well-developed.  HENT:  Mouth/Throat: Oropharynx is clear and moist.  Eyes: Conjunctivae are normal. Pupils are equal, round, and reactive to light.  Neck: Normal range of motion. No JVD present. No thyromegaly present.  Cardiovascular: Normal rate, regular rhythm, normal heart sounds and intact distal pulses.  Exam reveals no gallop and no friction rub.   No murmur heard. Pulmonary/Chest: Effort normal and breath  sounds normal. No respiratory distress. He has no wheezes. He has no rales. He exhibits no tenderness.  Abdominal: Soft. Bowel sounds are normal. He exhibits no distension and no mass. There is no tenderness. There is no rebound and no guarding.  Musculoskeletal: Normal range of motion. He exhibits no edema and no tenderness.  Lymphadenopathy:    He has no cervical adenopathy.  Neurological: He is alert and oriented to person, place, and time. He has normal reflexes. No cranial nerve deficit. He exhibits normal muscle tone. Coordination normal.  Skin: Skin is warm and dry. No rash noted.  Psychiatric: He has a normal mood and affect. His behavior is normal. Judgment and thought content normal.    Lab Results  Component Value Date   WBC 9.2 09/17/2013   HGB 15.5 09/17/2013   HCT 45.9 09/17/2013   PLT 268.0 09/17/2013   GLUCOSE 96 09/17/2013   CHOL 274* 09/17/2013   TRIG 440.0* 09/17/2013   HDL 40.50 09/17/2013   LDLDIRECT 120.9 06/11/2013   LDLCALC 146* 09/17/2013   ALT 17 09/17/2013   AST 22 09/17/2013   NA 143 09/17/2013   K 4.9 09/17/2013   CL 109 09/17/2013   CREATININE 1.3 09/17/2013   BUN 18 09/17/2013   CO2 27 09/17/2013   TSH 0.05* 09/17/2013   PSA 2.97 06/26/2010   INR 1.00 10/04/2011         Assessment & Plan:

## 2013-10-19 DIAGNOSIS — J019 Acute sinusitis, unspecified: Secondary | ICD-10-CM | POA: Diagnosis not present

## 2013-10-19 DIAGNOSIS — J40 Bronchitis, not specified as acute or chronic: Secondary | ICD-10-CM | POA: Diagnosis not present

## 2013-12-17 ENCOUNTER — Other Ambulatory Visit (INDEPENDENT_AMBULATORY_CARE_PROVIDER_SITE_OTHER): Payer: Medicare Other

## 2013-12-17 DIAGNOSIS — E291 Testicular hypofunction: Secondary | ICD-10-CM

## 2013-12-17 DIAGNOSIS — D751 Secondary polycythemia: Secondary | ICD-10-CM | POA: Diagnosis not present

## 2013-12-17 DIAGNOSIS — E039 Hypothyroidism, unspecified: Secondary | ICD-10-CM | POA: Diagnosis not present

## 2013-12-17 DIAGNOSIS — E785 Hyperlipidemia, unspecified: Secondary | ICD-10-CM

## 2013-12-17 LAB — LIPID PANEL
Cholesterol: 200 mg/dL (ref 0–200)
HDL: 36.7 mg/dL — ABNORMAL LOW (ref >39.00)
NonHDL: 163.3
Total CHOL/HDL Ratio: 5
Triglycerides: 287 mg/dL — ABNORMAL HIGH (ref 0.0–149.0)
VLDL: 57.4 mg/dL — AB (ref 0.0–40.0)

## 2013-12-17 LAB — BASIC METABOLIC PANEL
BUN: 19 mg/dL (ref 6–23)
CO2: 26 mEq/L (ref 19–32)
CREATININE: 1.5 mg/dL (ref 0.4–1.5)
Calcium: 9.6 mg/dL (ref 8.4–10.5)
Chloride: 110 mEq/L (ref 96–112)
GFR: 51.61 mL/min — AB (ref >60.00)
GLUCOSE: 98 mg/dL (ref 70–99)
POTASSIUM: 5.2 meq/L — AB (ref 3.5–5.1)
Sodium: 143 mEq/L (ref 135–145)

## 2013-12-17 LAB — T4, FREE: Free T4: 1.17 ng/dL (ref 0.60–1.60)

## 2013-12-17 LAB — CBC WITH DIFFERENTIAL/PLATELET
BASOS PCT: 0.3 % (ref 0.0–3.0)
Basophils Absolute: 0 10*3/uL (ref 0.0–0.1)
EOS ABS: 0.1 10*3/uL (ref 0.0–0.7)
Eosinophils Relative: 0.8 % (ref 0.0–5.0)
HEMATOCRIT: 45.5 % (ref 39.0–52.0)
HEMOGLOBIN: 15.3 g/dL (ref 13.0–17.0)
LYMPHS ABS: 1.6 10*3/uL (ref 0.7–4.0)
Lymphocytes Relative: 16.8 % (ref 12.0–46.0)
MCHC: 33.7 g/dL (ref 30.0–36.0)
MCV: 90.1 fl (ref 78.0–100.0)
MONO ABS: 0.7 10*3/uL (ref 0.1–1.0)
Monocytes Relative: 7.1 % (ref 3.0–12.0)
NEUTROS ABS: 7.1 10*3/uL (ref 1.4–7.7)
Neutrophils Relative %: 75 % (ref 43.0–77.0)
Platelets: 276 10*3/uL (ref 150.0–400.0)
RBC: 5.05 Mil/uL (ref 4.22–5.81)
RDW: 13.7 % (ref 11.5–15.5)
WBC: 9.5 10*3/uL (ref 4.0–10.5)

## 2013-12-17 LAB — HEPATIC FUNCTION PANEL
ALK PHOS: 87 U/L (ref 39–117)
ALT: 20 U/L (ref 0–53)
AST: 21 U/L (ref 0–37)
Albumin: 4.1 g/dL (ref 3.5–5.2)
BILIRUBIN DIRECT: 0.1 mg/dL (ref 0.0–0.3)
BILIRUBIN TOTAL: 1 mg/dL (ref 0.2–1.2)
Total Protein: 7.6 g/dL (ref 6.0–8.3)

## 2013-12-17 LAB — LDL CHOLESTEROL, DIRECT: Direct LDL: 109.3 mg/dL

## 2013-12-17 LAB — TSH: TSH: 0.04 u[IU]/mL — ABNORMAL LOW (ref 0.35–4.50)

## 2013-12-21 ENCOUNTER — Ambulatory Visit (INDEPENDENT_AMBULATORY_CARE_PROVIDER_SITE_OTHER): Payer: Medicare Other | Admitting: Internal Medicine

## 2013-12-21 ENCOUNTER — Encounter: Payer: Self-pay | Admitting: Internal Medicine

## 2013-12-21 VITALS — BP 136/80 | HR 72 | Temp 98.3°F | Resp 16 | Wt 214.0 lb

## 2013-12-21 DIAGNOSIS — Z23 Encounter for immunization: Secondary | ICD-10-CM | POA: Diagnosis not present

## 2013-12-21 DIAGNOSIS — E785 Hyperlipidemia, unspecified: Secondary | ICD-10-CM

## 2013-12-21 DIAGNOSIS — I10 Essential (primary) hypertension: Secondary | ICD-10-CM

## 2013-12-21 DIAGNOSIS — N183 Chronic kidney disease, stage 3 unspecified: Secondary | ICD-10-CM

## 2013-12-21 DIAGNOSIS — R030 Elevated blood-pressure reading, without diagnosis of hypertension: Secondary | ICD-10-CM

## 2013-12-21 DIAGNOSIS — E039 Hypothyroidism, unspecified: Secondary | ICD-10-CM

## 2013-12-21 DIAGNOSIS — E538 Deficiency of other specified B group vitamins: Secondary | ICD-10-CM

## 2013-12-21 DIAGNOSIS — E559 Vitamin D deficiency, unspecified: Secondary | ICD-10-CM

## 2013-12-21 MED ORDER — ATORVASTATIN CALCIUM 20 MG PO TABS: 20.0000 mg | ORAL_TABLET | Freq: Every day | ORAL | Status: DC

## 2013-12-21 NOTE — Assessment & Plan Note (Signed)
Continue with current prescription therapy as reflected on the Med list.  

## 2013-12-21 NOTE — Assessment & Plan Note (Signed)
BP Readings from Last 3 Encounters:  12/21/13 136/80  09/21/13 148/78  06/22/13 156/90

## 2013-12-21 NOTE — Assessment & Plan Note (Signed)
Continue with current prescription therapy as reflected on the Med list. FT4 is nl

## 2013-12-21 NOTE — Assessment & Plan Note (Signed)
Labs

## 2013-12-21 NOTE — Progress Notes (Signed)
Patient ID: Donald Phillips, male   DOB: [birth year], 68 y.o.   MRN: [id number]   Subjective:    HPI  The patient presents for a follow-up of  chronic hypertension, chronic dyslipidemia, hypogonadism controlled with medicines, however he had stopped some of them lately. He had L wrist fx in 5/13.  Crestor and testosterone too $$$ - he stopped both temporarily again  F/u on ED  F/u depression - better- his dad has dementia  Wt Readings from Last 3 Encounters:  12/21/13 214 lb (97.07 kg)  09/21/13 215 lb (97.523 kg)  06/22/13 218 lb (98.884 kg)   BP Readings from Last 3 Encounters:  12/21/13 136/80  09/21/13 148/78  06/22/13 156/90    Review of Systems  Constitutional: Negative for appetite change, fatigue and unexpected weight change.  HENT: Negative for congestion, nosebleeds, sneezing, sore throat and trouble swallowing.   Eyes: Negative for itching and visual disturbance.  Respiratory: Negative for cough.   Cardiovascular: Negative for chest pain, palpitations and leg swelling.  Gastrointestinal: Negative for nausea, diarrhea, blood in stool and abdominal distention.  Genitourinary: Negative for frequency and hematuria.  Musculoskeletal: Negative for back pain, gait problem, joint swelling and neck pain.  Skin: Negative for rash.  Neurological: Negative for dizziness, tremors, speech difficulty and weakness.  Psychiatric/Behavioral: Negative for sleep disturbance, dysphoric mood and agitation. The patient is not nervous/anxious.        Objective:   Physical Exam  Constitutional: He is oriented to person, place, and time. He appears well-developed.  HENT:  Mouth/Throat: Oropharynx is clear and moist.  Eyes: Conjunctivae are normal. Pupils are equal, round, and reactive to light.  Neck: Normal range of motion. No JVD present. No thyromegaly present.  Cardiovascular: Normal rate, regular rhythm, normal heart sounds and intact distal pulses.  Exam reveals no gallop and  no friction rub.   No murmur heard. Pulmonary/Chest: Effort normal and breath sounds normal. No respiratory distress. He has no wheezes. He has no rales. He exhibits no tenderness.  Abdominal: Soft. Bowel sounds are normal. He exhibits no distension and no mass. There is no tenderness. There is no rebound and no guarding.  Musculoskeletal: Normal range of motion. He exhibits no edema and no tenderness.  Lymphadenopathy:    He has no cervical adenopathy.  Neurological: He is alert and oriented to person, place, and time. He has normal reflexes. No cranial nerve deficit. He exhibits normal muscle tone. Coordination normal.  Skin: Skin is warm and dry. No rash noted.  Psychiatric: He has a normal mood and affect. His behavior is normal. Judgment and thought content normal.    Lab Results  Component Value Date   WBC 9.5 12/17/2013   HGB 15.3 12/17/2013   HCT 45.5 12/17/2013   PLT 276.0 12/17/2013   GLUCOSE 98 12/17/2013   CHOL 200 12/17/2013   TRIG 287.0* 12/17/2013   HDL 36.70* 12/17/2013   LDLDIRECT 109.3 12/17/2013   LDLCALC 146* 09/17/2013   ALT 20 12/17/2013   AST 21 12/17/2013   NA 143 12/17/2013   K 5.2* 12/17/2013   CL 110 12/17/2013   CREATININE 1.5 12/17/2013   BUN 19 12/17/2013   CO2 26 12/17/2013   TSH 0.04* 12/17/2013   PSA 2.97 06/26/2010   INR 1.00 10/04/2011         Assessment & Plan:

## 2013-12-21 NOTE — Progress Notes (Signed)
Pre visit review using our clinic review tool, if applicable. No additional management support is needed unless otherwise documented below in the visit note. 

## 2013-12-21 NOTE — Assessment & Plan Note (Signed)
Continue with current prescription therapy as reflected on the Med list.  2015 Lipitor - can tolerate a low dose only due to arthralgia

## 2014-01-25 DIAGNOSIS — M25519 Pain in unspecified shoulder: Secondary | ICD-10-CM | POA: Diagnosis not present

## 2014-02-22 ENCOUNTER — Other Ambulatory Visit: Payer: Self-pay

## 2014-03-22 ENCOUNTER — Other Ambulatory Visit: Payer: Self-pay | Admitting: Internal Medicine

## 2014-04-08 ENCOUNTER — Other Ambulatory Visit (INDEPENDENT_AMBULATORY_CARE_PROVIDER_SITE_OTHER): Payer: Medicare Other

## 2014-04-08 DIAGNOSIS — E785 Hyperlipidemia, unspecified: Secondary | ICD-10-CM

## 2014-04-08 DIAGNOSIS — N183 Chronic kidney disease, stage 3 unspecified: Secondary | ICD-10-CM

## 2014-04-08 DIAGNOSIS — L570 Actinic keratosis: Secondary | ICD-10-CM | POA: Diagnosis not present

## 2014-04-08 DIAGNOSIS — E538 Deficiency of other specified B group vitamins: Secondary | ICD-10-CM

## 2014-04-08 DIAGNOSIS — L821 Other seborrheic keratosis: Secondary | ICD-10-CM | POA: Diagnosis not present

## 2014-04-08 DIAGNOSIS — L719 Rosacea, unspecified: Secondary | ICD-10-CM | POA: Diagnosis not present

## 2014-04-08 DIAGNOSIS — D492 Neoplasm of unspecified behavior of bone, soft tissue, and skin: Secondary | ICD-10-CM | POA: Diagnosis not present

## 2014-04-08 DIAGNOSIS — L579 Skin changes due to chronic exposure to nonionizing radiation, unspecified: Secondary | ICD-10-CM | POA: Diagnosis not present

## 2014-04-08 DIAGNOSIS — E559 Vitamin D deficiency, unspecified: Secondary | ICD-10-CM

## 2014-04-08 DIAGNOSIS — E039 Hypothyroidism, unspecified: Secondary | ICD-10-CM | POA: Diagnosis not present

## 2014-04-08 DIAGNOSIS — R030 Elevated blood-pressure reading, without diagnosis of hypertension: Secondary | ICD-10-CM | POA: Diagnosis not present

## 2014-04-08 DIAGNOSIS — L814 Other melanin hyperpigmentation: Secondary | ICD-10-CM | POA: Diagnosis not present

## 2014-04-08 DIAGNOSIS — I10 Essential (primary) hypertension: Secondary | ICD-10-CM

## 2014-04-08 DIAGNOSIS — Z85828 Personal history of other malignant neoplasm of skin: Secondary | ICD-10-CM | POA: Diagnosis not present

## 2014-04-08 LAB — BASIC METABOLIC PANEL
BUN: 15 mg/dL (ref 6–23)
CHLORIDE: 107 meq/L (ref 96–112)
CO2: 25 mEq/L (ref 19–32)
Calcium: 9 mg/dL (ref 8.4–10.5)
Creatinine, Ser: 1.2 mg/dL (ref 0.4–1.5)
GFR: 67.37 mL/min (ref >60.00)
Glucose, Bld: 103 mg/dL — ABNORMAL HIGH (ref 70–99)
POTASSIUM: 4.8 meq/L (ref 3.5–5.1)
Sodium: 139 mEq/L (ref 135–145)

## 2014-04-08 LAB — CBC WITH DIFFERENTIAL/PLATELET
BASOS ABS: 0 10*3/uL (ref 0.0–0.1)
BASOS PCT: 0.4 % (ref 0.0–3.0)
Eosinophils Absolute: 0.2 10*3/uL (ref 0.0–0.7)
Eosinophils Relative: 2.4 % (ref 0.0–5.0)
HCT: 44.7 % (ref 39.0–52.0)
HEMOGLOBIN: 15 g/dL (ref 13.0–17.0)
LYMPHS PCT: 26.1 % (ref 12.0–46.0)
Lymphs Abs: 2.2 10*3/uL (ref 0.7–4.0)
MCHC: 33.6 g/dL (ref 30.0–36.0)
MCV: 93.1 fl (ref 78.0–100.0)
Monocytes Absolute: 0.6 10*3/uL (ref 0.1–1.0)
Monocytes Relative: 7.1 % (ref 3.0–12.0)
NEUTROS PCT: 64 % (ref 43.0–77.0)
Neutro Abs: 5.3 10*3/uL (ref 1.4–7.7)
Platelets: 274 10*3/uL (ref 150.0–400.0)
RBC: 4.8 Mil/uL (ref 4.22–5.81)
RDW: 13 % (ref 11.5–15.5)
WBC: 8.3 10*3/uL (ref 4.0–10.5)

## 2014-04-08 LAB — LIPID PANEL
CHOL/HDL RATIO: 7
Cholesterol: 213 mg/dL — ABNORMAL HIGH (ref 0–200)
HDL: 30.6 mg/dL — ABNORMAL LOW (ref >39.00)
NONHDL: 182.4
Triglycerides: 545 mg/dL — ABNORMAL HIGH (ref 0.0–149.0)
VLDL: 109 mg/dL — ABNORMAL HIGH (ref 0.0–40.0)

## 2014-04-08 LAB — LDL CHOLESTEROL, DIRECT: LDL DIRECT: 77.5 mg/dL

## 2014-04-11 DIAGNOSIS — D038 Melanoma in situ of other sites: Secondary | ICD-10-CM | POA: Diagnosis not present

## 2014-04-19 ENCOUNTER — Ambulatory Visit (INDEPENDENT_AMBULATORY_CARE_PROVIDER_SITE_OTHER): Payer: Medicare Other | Admitting: Internal Medicine

## 2014-04-19 ENCOUNTER — Encounter: Payer: Self-pay | Admitting: Internal Medicine

## 2014-04-19 VITALS — BP 130/92 | HR 67 | Temp 97.9°F | Wt 212.0 lb

## 2014-04-19 DIAGNOSIS — Z23 Encounter for immunization: Secondary | ICD-10-CM

## 2014-04-19 NOTE — Progress Notes (Signed)
Pre visit review using our clinic review tool, if applicable. No additional management support is needed unless otherwise documented below in the visit note. 

## 2014-04-19 NOTE — Progress Notes (Signed)
Subjective:    HPI  The patient presents for a follow-up of  chronic hypertension, chronic dyslipidemia, hypogonadism controlled with medicines, however he had stopped some of them lately. He had L wrist fx in 5/13. C/o R shoulder Melanoma 12/15 - surgry pending  Crestor and testosterone too $$$ - he stopped both temporarily again  F/u on ED  F/u depression - better- his dad has dementia  Wt Readings from Last 3 Encounters:  04/19/14 212 lb (96.163 kg)  12/21/13 214 lb (97.07 kg)  09/21/13 215 lb (97.523 kg)   BP Readings from Last 3 Encounters:  04/19/14 130/92  12/21/13 136/80  09/21/13 148/78    Review of Systems  Constitutional: Negative for appetite change, fatigue and unexpected weight change.  HENT: Negative for congestion, nosebleeds, sneezing, sore throat and trouble swallowing.   Eyes: Negative for itching and visual disturbance.  Respiratory: Negative for cough.   Cardiovascular: Negative for chest pain, palpitations and leg swelling.  Gastrointestinal: Negative for nausea, diarrhea, blood in stool and abdominal distention.  Genitourinary: Negative for frequency and hematuria.  Musculoskeletal: Negative for back pain, joint swelling, gait problem and neck pain.  Skin: Negative for rash.  Neurological: Negative for dizziness, tremors, speech difficulty and weakness.  Psychiatric/Behavioral: Negative for sleep disturbance, dysphoric mood and agitation. The patient is not nervous/anxious.        Objective:   Physical Exam  Constitutional: He is oriented to person, place, and time. He appears well-developed. No distress.  NAD  HENT:  Mouth/Throat: Oropharynx is clear and moist.  Eyes: Conjunctivae are normal. Pupils are equal, round, and reactive to light.  Neck: Normal range of motion. No JVD present. No thyromegaly present.  Cardiovascular: Normal rate, regular rhythm, normal heart sounds and intact distal pulses.  Exam reveals no gallop and no friction  rub.   No murmur heard. Pulmonary/Chest: Effort normal and breath sounds normal. No respiratory distress. He has no wheezes. He has no rales. He exhibits no tenderness.  Abdominal: Soft. Bowel sounds are normal. He exhibits no distension and no mass. There is no tenderness. There is no rebound and no guarding.  Musculoskeletal: Normal range of motion. He exhibits no edema or tenderness.  Lymphadenopathy:    He has no cervical adenopathy.  Neurological: He is alert and oriented to person, place, and time. He has normal reflexes. No cranial nerve deficit. He exhibits normal muscle tone. He displays a negative Romberg sign. Coordination and gait normal.  No meningeal signs  Skin: Skin is warm and dry. No rash noted.  Psychiatric: He has a normal mood and affect. His behavior is normal. Judgment and thought content normal.    Lab Results  Component Value Date   WBC 8.3 04/08/2014   HGB 15.0 04/08/2014   HCT 44.7 04/08/2014   PLT 274.0 04/08/2014   GLUCOSE 103* 04/08/2014   CHOL 213* 04/08/2014   TRIG 545.0* 04/08/2014   HDL 30.60* 04/08/2014   LDLDIRECT 77.5 04/08/2014   LDLCALC 146* 09/17/2013   ALT 20 12/17/2013   AST 21 12/17/2013   NA 139 04/08/2014   K 4.8 04/08/2014   CL 107 04/08/2014   CREATININE 1.2 04/08/2014   BUN 15 04/08/2014   CO2 25 04/08/2014   TSH 0.04* 12/17/2013   PSA 2.97 06/26/2010   INR 1.00 10/04/2011         Assessment & Plan:  Patient ID: Donald Phillips, male   DOB: [birth year], 68 y.o.   MRN: [id number]

## 2014-04-19 NOTE — Patient Instructions (Signed)
Low carb diet 

## 2014-04-21 ENCOUNTER — Encounter: Payer: Self-pay | Admitting: Internal Medicine

## 2014-04-25 DIAGNOSIS — L905 Scar conditions and fibrosis of skin: Secondary | ICD-10-CM | POA: Diagnosis not present

## 2014-04-25 DIAGNOSIS — D0359 Melanoma in situ of other part of trunk: Secondary | ICD-10-CM | POA: Diagnosis not present

## 2014-07-04 ENCOUNTER — Other Ambulatory Visit: Payer: Self-pay | Admitting: Internal Medicine

## 2014-08-12 ENCOUNTER — Other Ambulatory Visit: Payer: Self-pay | Admitting: *Deleted

## 2014-08-12 ENCOUNTER — Other Ambulatory Visit (INDEPENDENT_AMBULATORY_CARE_PROVIDER_SITE_OTHER): Payer: Medicare Other

## 2014-08-12 DIAGNOSIS — E038 Other specified hypothyroidism: Secondary | ICD-10-CM | POA: Diagnosis not present

## 2014-08-12 DIAGNOSIS — E785 Hyperlipidemia, unspecified: Secondary | ICD-10-CM

## 2014-08-12 DIAGNOSIS — I10 Essential (primary) hypertension: Secondary | ICD-10-CM

## 2014-08-12 LAB — TSH: TSH: 0.05 u[IU]/mL — AB (ref 0.35–4.50)

## 2014-08-12 LAB — HEPATIC FUNCTION PANEL
ALT: 18 U/L (ref 0–53)
AST: 15 U/L (ref 0–37)
Albumin: 4 g/dL (ref 3.5–5.2)
Alkaline Phosphatase: 104 U/L (ref 39–117)
Bilirubin, Direct: 0.1 mg/dL (ref 0.0–0.3)
TOTAL PROTEIN: 7.5 g/dL (ref 6.0–8.3)
Total Bilirubin: 0.6 mg/dL (ref 0.2–1.2)

## 2014-08-12 LAB — BASIC METABOLIC PANEL
BUN: 18 mg/dL (ref 6–23)
CO2: 27 mEq/L (ref 19–32)
Calcium: 9.9 mg/dL (ref 8.4–10.5)
Chloride: 109 mEq/L (ref 96–112)
Creatinine, Ser: 1.43 mg/dL (ref 0.40–1.50)
GFR: 52.34 mL/min — AB (ref >60.00)
Glucose, Bld: 112 mg/dL — ABNORMAL HIGH (ref 70–99)
Potassium: 5 mEq/L (ref 3.5–5.1)
Sodium: 141 mEq/L (ref 135–145)

## 2014-08-12 LAB — LIPID PANEL
Cholesterol: 245 mg/dL — ABNORMAL HIGH (ref 0–200)
HDL: 36.5 mg/dL — AB (ref >39.00)
NonHDL: 208.5
Total CHOL/HDL Ratio: 7
Triglycerides: 325 mg/dL — ABNORMAL HIGH (ref 0.0–149.0)
VLDL: 65 mg/dL — ABNORMAL HIGH (ref 0.0–40.0)

## 2014-08-12 LAB — T4, FREE: Free T4: 1.5 ng/dL (ref 0.60–1.60)

## 2014-08-12 LAB — LDL CHOLESTEROL, DIRECT: Direct LDL: 135 mg/dL

## 2014-08-19 ENCOUNTER — Encounter: Payer: Self-pay | Admitting: Internal Medicine

## 2014-08-19 ENCOUNTER — Ambulatory Visit (INDEPENDENT_AMBULATORY_CARE_PROVIDER_SITE_OTHER): Payer: Medicare Other | Admitting: Internal Medicine

## 2014-08-19 VITALS — BP 140/72 | HR 78 | Wt 213.0 lb

## 2014-08-19 DIAGNOSIS — E785 Hyperlipidemia, unspecified: Secondary | ICD-10-CM

## 2014-08-19 DIAGNOSIS — E038 Other specified hypothyroidism: Secondary | ICD-10-CM | POA: Diagnosis not present

## 2014-08-19 DIAGNOSIS — R739 Hyperglycemia, unspecified: Secondary | ICD-10-CM

## 2014-08-19 DIAGNOSIS — E538 Deficiency of other specified B group vitamins: Secondary | ICD-10-CM

## 2014-08-19 DIAGNOSIS — I10 Essential (primary) hypertension: Secondary | ICD-10-CM | POA: Diagnosis not present

## 2014-08-19 DIAGNOSIS — E034 Atrophy of thyroid (acquired): Secondary | ICD-10-CM

## 2014-08-19 DIAGNOSIS — N320 Bladder-neck obstruction: Secondary | ICD-10-CM

## 2014-08-19 MED ORDER — ATORVASTATIN CALCIUM 20 MG PO TABS
20.0000 mg | ORAL_TABLET | Freq: Every day | ORAL | Status: DC
Start: 2014-08-19 — End: 2015-09-12

## 2014-08-19 MED ORDER — BUPROPION HCL ER (XL) 300 MG PO TB24: 300.0000 mg | ORAL_TABLET | Freq: Every day | ORAL | Status: DC

## 2014-08-19 MED ORDER — ATORVASTATIN CALCIUM 20 MG PO TABS: 20.0000 mg | ORAL_TABLET | Freq: Every day | ORAL | Status: DC

## 2014-08-19 NOTE — Assessment & Plan Note (Signed)
Re-start Lipitor 

## 2014-08-19 NOTE — Progress Notes (Signed)
   Subjective:    HPI  The patient presents for a follow-up of  chronic hypertension, chronic dyslipidemia, hypogonadism controlled with medicines, however he had stopped some of them lately. He had L wrist fx in 5/13. F/u R shoulder Melanoma 12/15 - surgery s/p  Crestor and testosterone too $$$ - he stopped both. Not taking Lipitor  F/u on ED  F/u depression - better- his dad has dementia  Wt Readings from Last 3 Encounters:  08/19/14 213 lb (96.616 kg)  04/19/14 212 lb (96.163 kg)  12/21/13 214 lb (97.07 kg)   BP Readings from Last 3 Encounters:  08/19/14 140/72  04/19/14 130/92  12/21/13 136/80    Review of Systems  Constitutional: Negative for appetite change, fatigue and unexpected weight change.  HENT: Negative for congestion, nosebleeds, sneezing, sore throat and trouble swallowing.   Eyes: Negative for itching and visual disturbance.  Respiratory: Negative for cough.   Cardiovascular: Negative for chest pain, palpitations and leg swelling.  Gastrointestinal: Negative for nausea, diarrhea, blood in stool and abdominal distention.  Genitourinary: Negative for frequency and hematuria.  Musculoskeletal: Negative for back pain, joint swelling, gait problem and neck pain.  Skin: Negative for rash.  Neurological: Negative for dizziness, tremors, speech difficulty and weakness.  Psychiatric/Behavioral: Negative for sleep disturbance, dysphoric mood and agitation. The patient is not nervous/anxious.        Objective:   Physical Exam  Constitutional: He is oriented to person, place, and time. He appears well-developed. No distress.  NAD  HENT:  Mouth/Throat: Oropharynx is clear and moist.  Eyes: Conjunctivae are normal. Pupils are equal, round, and reactive to light.  Neck: Normal range of motion. No JVD present. No thyromegaly present.  Cardiovascular: Normal rate, regular rhythm, normal heart sounds and intact distal pulses.  Exam reveals no gallop and no friction  rub.   No murmur heard. Pulmonary/Chest: Effort normal and breath sounds normal. No respiratory distress. He has no wheezes. He has no rales. He exhibits no tenderness.  Abdominal: Soft. Bowel sounds are normal. He exhibits no distension and no mass. There is no tenderness. There is no rebound and no guarding.  Musculoskeletal: Normal range of motion. He exhibits no edema or tenderness.  Lymphadenopathy:    He has no cervical adenopathy.  Neurological: He is alert and oriented to person, place, and time. He has normal reflexes. No cranial nerve deficit. He exhibits normal muscle tone. He displays a negative Romberg sign. Coordination and gait normal.  No meningeal signs  Skin: Skin is warm and dry. No rash noted.  Psychiatric: He has a normal mood and affect. His behavior is normal. Judgment and thought content normal.    Lab Results  Component Value Date   WBC 8.3 04/08/2014   HGB 15.0 04/08/2014   HCT 44.7 04/08/2014   PLT 274.0 04/08/2014   GLUCOSE 112* 08/12/2014   CHOL 245* 08/12/2014   TRIG 325.0* 08/12/2014   HDL 36.50* 08/12/2014   LDLDIRECT 135.0 08/12/2014   LDLCALC 146* 09/17/2013   ALT 18 08/12/2014   AST 15 08/12/2014   NA 141 08/12/2014   K 5.0 08/12/2014   CL 109 08/12/2014   CREATININE 1.43 08/12/2014   BUN 18 08/12/2014   CO2 27 08/12/2014   TSH 0.05* 08/12/2014   PSA 2.97 06/26/2010   INR 1.00 10/04/2011         Assessment & Plan:

## 2014-08-19 NOTE — Assessment & Plan Note (Signed)
BP Readings from Last 3 Encounters:  08/19/14 140/72  04/19/14 130/92  12/21/13 136/80

## 2014-08-19 NOTE — Assessment & Plan Note (Signed)
On B12 

## 2014-08-19 NOTE — Assessment & Plan Note (Signed)
On Levothroid. FT4 - WNL 

## 2014-08-19 NOTE — Progress Notes (Signed)
Pre visit review using our clinic review tool, if applicable. No additional management support is needed unless otherwise documented below in the visit note. 

## 2014-09-30 IMAGING — CR DG CHEST 2V
3 series · 3 of 3 positions shown · non-contrast
Comparison: 10/04/2011.

CLINICAL DATA: Bronchitis. Three month followup.

EXAM:
CHEST  2 VIEW

[view not recorded (1 of 3)]
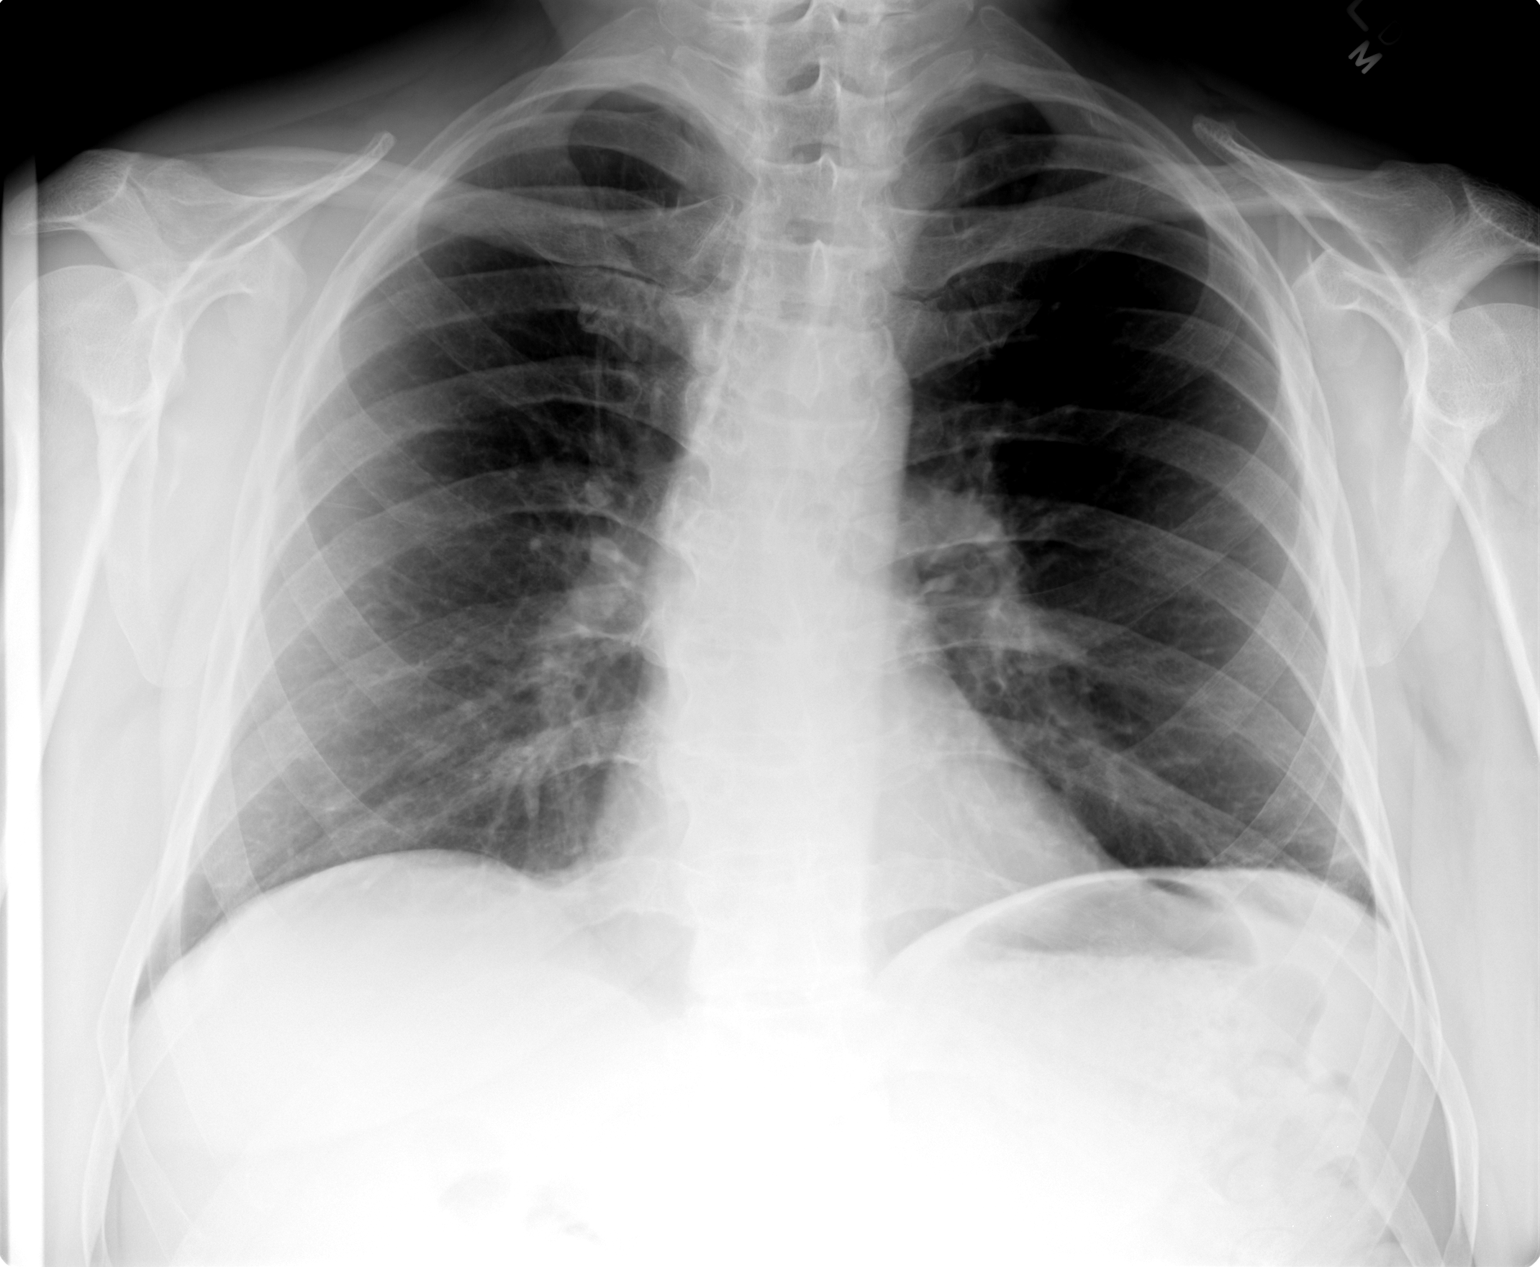

[view not recorded (2 of 3)]
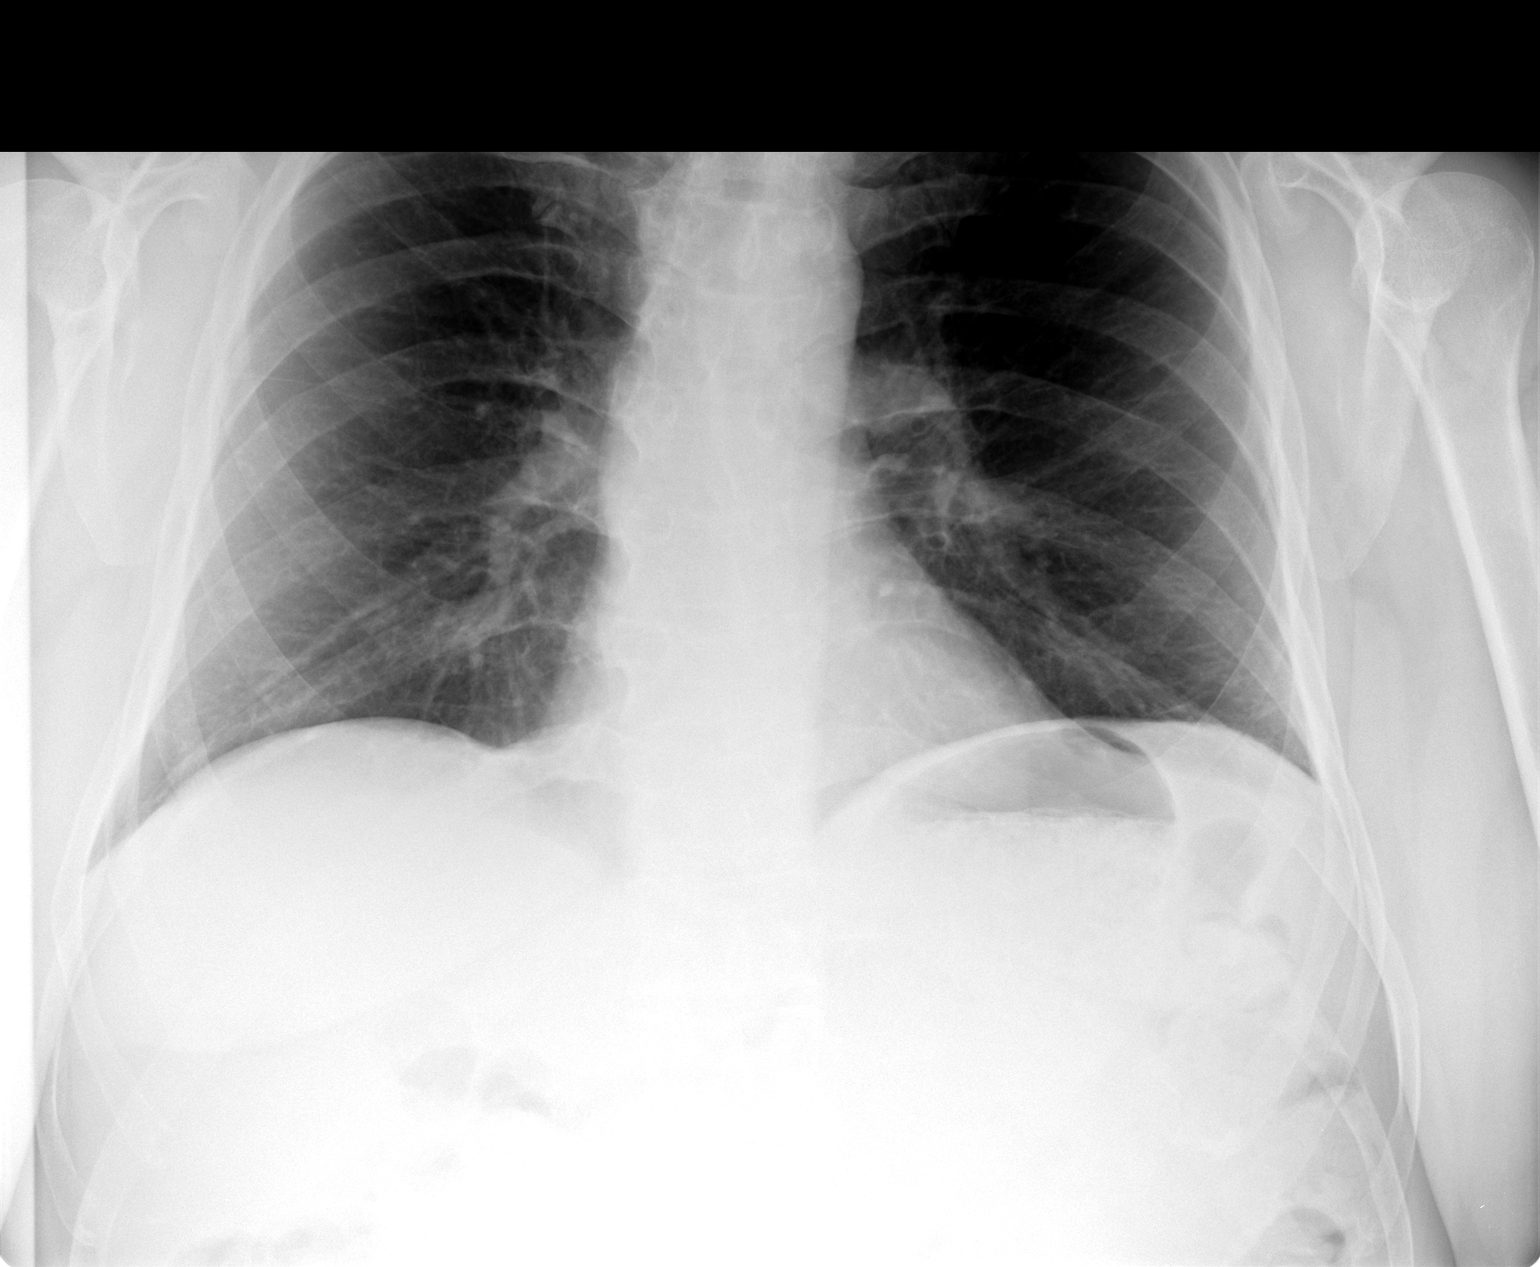

[view not recorded (3 of 3)]
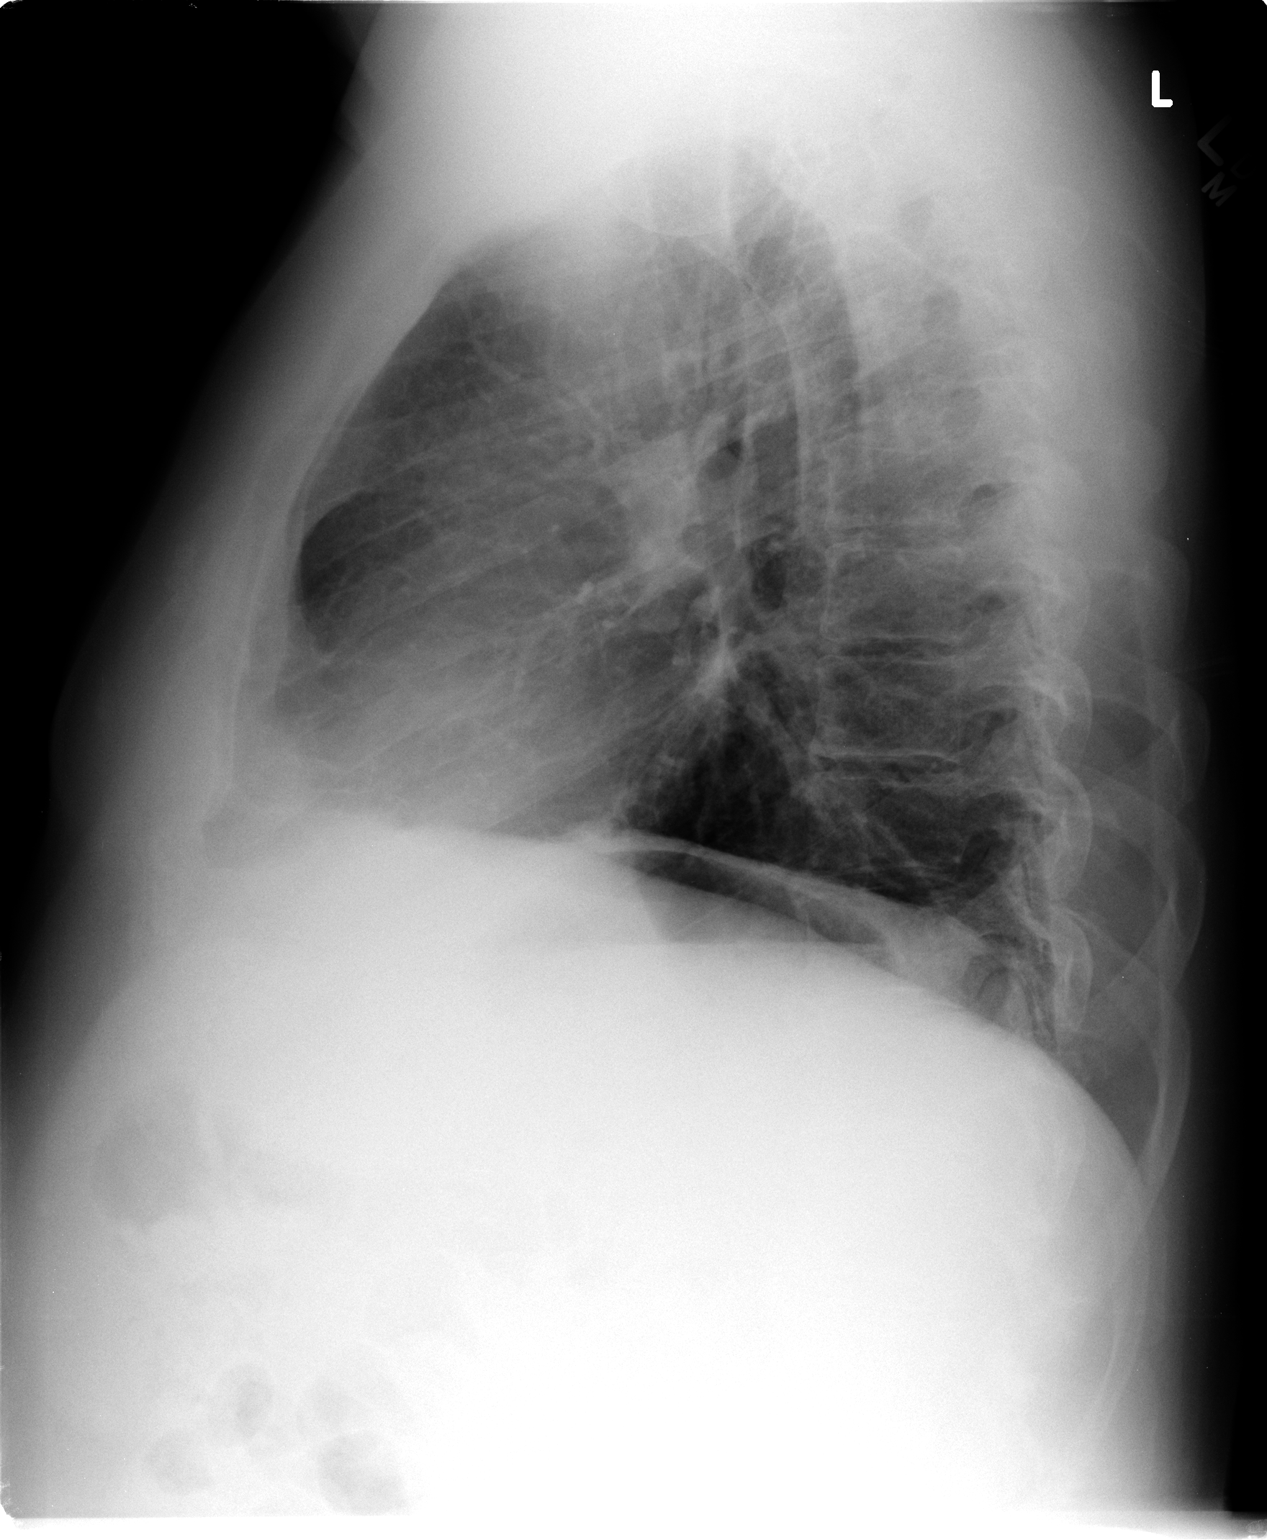

[3 of 3 positions shown; findings below may reference images not displayed]

FINDINGS: The heart size and mediastinal contours are within normal limits.
Both lungs are clear. The bony thorax is demineralized but intact.
IMPRESSION: No active cardiopulmonary disease.

## 2014-10-18 DIAGNOSIS — H60501 Unspecified acute noninfective otitis externa, right ear: Secondary | ICD-10-CM | POA: Diagnosis not present

## 2014-11-08 DIAGNOSIS — H60501 Unspecified acute noninfective otitis externa, right ear: Secondary | ICD-10-CM | POA: Diagnosis not present

## 2014-11-08 DIAGNOSIS — H6123 Impacted cerumen, bilateral: Secondary | ICD-10-CM | POA: Diagnosis not present

## 2014-11-25 DIAGNOSIS — Z85820 Personal history of malignant melanoma of skin: Secondary | ICD-10-CM | POA: Diagnosis not present

## 2014-11-25 DIAGNOSIS — L570 Actinic keratosis: Secondary | ICD-10-CM | POA: Diagnosis not present

## 2014-11-25 DIAGNOSIS — L814 Other melanin hyperpigmentation: Secondary | ICD-10-CM | POA: Diagnosis not present

## 2014-11-25 DIAGNOSIS — L719 Rosacea, unspecified: Secondary | ICD-10-CM | POA: Diagnosis not present

## 2014-11-25 DIAGNOSIS — L821 Other seborrheic keratosis: Secondary | ICD-10-CM | POA: Diagnosis not present

## 2014-11-25 DIAGNOSIS — L579 Skin changes due to chronic exposure to nonionizing radiation, unspecified: Secondary | ICD-10-CM | POA: Diagnosis not present

## 2014-11-29 DIAGNOSIS — H6063 Unspecified chronic otitis externa, bilateral: Secondary | ICD-10-CM | POA: Diagnosis not present

## 2014-12-03 ENCOUNTER — Telehealth: Payer: Self-pay

## 2014-12-03 NOTE — Telephone Encounter (Signed)
The patient is scheduled to see Dr. Alain Marion on 8/15; called to see if he can come in prior to his apt date for AWV. LVM

## 2014-12-05 NOTE — Telephone Encounter (Signed)
Follow up regarding AWV; LVM to call directly to schedule prior to Dr. Alain Marion visit on 08/15

## 2014-12-06 ENCOUNTER — Telehealth: Payer: Self-pay

## 2014-12-06 NOTE — Telephone Encounter (Signed)
LVM regarding AWV/ will return call at [phone]/ will outreach [phone]

## 2014-12-09 NOTE — Telephone Encounter (Signed)
Call back to the patient to educate on AWV prior to apt for CPE week of 8/15/ LVM for return call to direct number [phone]

## 2014-12-11 NOTE — Telephone Encounter (Signed)
Returned the patient's call and spoke to wife; The wife was repeated that she was not interested in wellness and did not need this and asked if this was absolutely necessary;  Explained the purpose of the AWV; the wife reiterated several times as to this not being necessary and explained that was perfectly ok, It was not necessary to come for the AWV and glad the patient is doing well.

## 2014-12-18 ENCOUNTER — Other Ambulatory Visit (INDEPENDENT_AMBULATORY_CARE_PROVIDER_SITE_OTHER): Payer: Medicare Other

## 2014-12-18 DIAGNOSIS — E785 Hyperlipidemia, unspecified: Secondary | ICD-10-CM | POA: Diagnosis not present

## 2014-12-18 DIAGNOSIS — I10 Essential (primary) hypertension: Secondary | ICD-10-CM

## 2014-12-18 DIAGNOSIS — E038 Other specified hypothyroidism: Secondary | ICD-10-CM

## 2014-12-18 DIAGNOSIS — R739 Hyperglycemia, unspecified: Secondary | ICD-10-CM

## 2014-12-18 DIAGNOSIS — E538 Deficiency of other specified B group vitamins: Secondary | ICD-10-CM | POA: Diagnosis not present

## 2014-12-18 DIAGNOSIS — N320 Bladder-neck obstruction: Secondary | ICD-10-CM

## 2014-12-18 DIAGNOSIS — E034 Atrophy of thyroid (acquired): Secondary | ICD-10-CM

## 2014-12-18 LAB — PSA: PSA: 2.75 ng/mL (ref 0.10–4.00)

## 2014-12-18 LAB — HEPATIC FUNCTION PANEL
ALT: 18 U/L (ref 0–53)
AST: 16 U/L (ref 0–37)
Albumin: 4.1 g/dL (ref 3.5–5.2)
Alkaline Phosphatase: 90 U/L (ref 39–117)
BILIRUBIN DIRECT: 0.1 mg/dL (ref 0.0–0.3)
Total Bilirubin: 0.6 mg/dL (ref 0.2–1.2)
Total Protein: 7.2 g/dL (ref 6.0–8.3)

## 2014-12-18 LAB — BASIC METABOLIC PANEL
BUN: 14 mg/dL (ref 6–23)
CALCIUM: 9.5 mg/dL (ref 8.4–10.5)
CHLORIDE: 105 meq/L (ref 96–112)
CO2: 29 mEq/L (ref 19–32)
Creatinine, Ser: 1.21 mg/dL (ref 0.40–1.50)
GFR: 63.4 mL/min (ref >60.00)
GLUCOSE: 113 mg/dL — AB (ref 70–99)
Potassium: 5.8 mEq/L — ABNORMAL HIGH (ref 3.5–5.1)
Sodium: 142 mEq/L (ref 135–145)

## 2014-12-18 LAB — CBC WITH DIFFERENTIAL/PLATELET
Basophils Absolute: 0 10*3/uL (ref 0.0–0.1)
Basophils Relative: 0.5 % (ref 0.0–3.0)
EOS PCT: 2.7 % (ref 0.0–5.0)
Eosinophils Absolute: 0.2 10*3/uL (ref 0.0–0.7)
HCT: 45.9 % (ref 39.0–52.0)
HEMOGLOBIN: 15.6 g/dL (ref 13.0–17.0)
Lymphocytes Relative: 28.2 % (ref 12.0–46.0)
Lymphs Abs: 2.2 10*3/uL (ref 0.7–4.0)
MCHC: 34 g/dL (ref 30.0–36.0)
MCV: 93.3 fl (ref 78.0–100.0)
MONOS PCT: 6.4 % (ref 3.0–12.0)
Monocytes Absolute: 0.5 10*3/uL (ref 0.1–1.0)
NEUTROS ABS: 4.9 10*3/uL (ref 1.4–7.7)
Neutrophils Relative %: 62.2 % (ref 43.0–77.0)
Platelets: 265 10*3/uL (ref 150.0–400.0)
RBC: 4.92 Mil/uL (ref 4.22–5.81)
RDW: 14 % (ref 11.5–15.5)
WBC: 7.8 10*3/uL (ref 4.0–10.5)

## 2014-12-18 LAB — HEMOGLOBIN A1C: Hgb A1c MFr Bld: 5.9 % (ref 4.6–6.5)

## 2014-12-18 LAB — LIPID PANEL
Cholesterol: 212 mg/dL — ABNORMAL HIGH (ref 0–200)
HDL: 32.9 mg/dL — AB (ref >39.00)
Total CHOL/HDL Ratio: 6
Triglycerides: 481 mg/dL — ABNORMAL HIGH (ref 0.0–149.0)

## 2014-12-18 LAB — LDL CHOLESTEROL, DIRECT: Direct LDL: 88 mg/dL

## 2014-12-23 ENCOUNTER — Encounter: Payer: Self-pay | Admitting: Internal Medicine

## 2014-12-23 ENCOUNTER — Ambulatory Visit (INDEPENDENT_AMBULATORY_CARE_PROVIDER_SITE_OTHER): Payer: Medicare Other | Admitting: Internal Medicine

## 2014-12-23 VITALS — BP 139/80 | HR 71 | Ht 70.0 in | Wt 215.0 lb

## 2014-12-23 DIAGNOSIS — E875 Hyperkalemia: Secondary | ICD-10-CM

## 2014-12-23 DIAGNOSIS — E291 Testicular hypofunction: Secondary | ICD-10-CM | POA: Diagnosis not present

## 2014-12-23 DIAGNOSIS — I10 Essential (primary) hypertension: Secondary | ICD-10-CM

## 2014-12-23 DIAGNOSIS — Z0000 Encounter for general adult medical examination without abnormal findings: Secondary | ICD-10-CM

## 2014-12-23 DIAGNOSIS — E785 Hyperlipidemia, unspecified: Secondary | ICD-10-CM | POA: Diagnosis not present

## 2014-12-23 DIAGNOSIS — Z23 Encounter for immunization: Secondary | ICD-10-CM

## 2014-12-23 DIAGNOSIS — E034 Atrophy of thyroid (acquired): Secondary | ICD-10-CM

## 2014-12-23 MED ORDER — MEGARED OMEGA-3 KRILL OIL 500 MG PO CAPS: 1.0000 | ORAL_CAPSULE | Freq: Every morning | ORAL | Status: DC

## 2014-12-23 NOTE — Assessment & Plan Note (Signed)
Shots discussed

## 2014-12-23 NOTE — Patient Instructions (Signed)
Health Maintenance A healthy lifestyle and preventative care can promote health and wellness.  Maintain regular health, dental, and eye exams.  Eat a healthy diet. Foods like vegetables, fruits, whole grains, low-fat dairy products, and lean protein foods contain the nutrients you need and are low in calories. Decrease your intake of foods high in solid fats, added sugars, and salt. Get information about a proper diet from your health care provider, if necessary.  Regular physical exercise is one of the most important things you can do for your health. Most adults should get at least 150 minutes of moderate-intensity exercise (any activity that increases your heart rate and causes you to sweat) each week. In addition, most adults need muscle-strengthening exercises on 2 or more days a week.   Maintain a healthy weight. The body mass index (BMI) is a screening tool to identify possible weight problems. It provides an estimate of body fat based on height and weight. Your health care provider can find your BMI and can help you achieve or maintain a healthy weight. For males 20 years and older:  A BMI below 18.5 is considered underweight.  A BMI of 18.5 to 24.9 is normal.  A BMI of 25 to 29.9 is considered overweight.  A BMI of 30 and above is considered obese.  Maintain normal blood lipids and cholesterol by exercising and minimizing your intake of saturated fat. Eat a balanced diet with plenty of fruits and vegetables. Blood tests for lipids and cholesterol should begin at age 20 and be repeated every 5 years. If your lipid or cholesterol levels are high, you are over age 50, or you are at high risk for heart disease, you may need your cholesterol levels checked more frequently.Ongoing high lipid and cholesterol levels should be treated with medicines if diet and exercise are not working.  If you smoke, find out from your health care provider how to quit. If you do not use tobacco, do not  start.  Lung cancer screening is recommended for adults aged 55-80 years who are at high risk for developing lung cancer because of a history of smoking. A yearly low-dose CT scan of the lungs is recommended for people who have at least a 30-pack-year history of smoking and are current smokers or have quit within the past 15 years. A pack year of smoking is smoking an average of 1 pack of cigarettes a day for 1 year (for example, a 30-pack-year history of smoking could mean smoking 1 pack a day for 30 years or 2 packs a day for 15 years). Yearly screening should continue until the smoker has stopped smoking for at least 15 years. Yearly screening should be stopped for people who develop a health problem that would prevent them from having lung cancer treatment.  If you choose to drink alcohol, do not have more than 2 drinks per day. One drink is considered to be 12 oz (360 mL) of beer, 5 oz (150 mL) of wine, or 1.5 oz (45 mL) of liquor.  Avoid the use of street drugs. Do not share needles with anyone. Ask for help if you need support or instructions about stopping the use of drugs.  High blood pressure causes heart disease and increases the risk of stroke. Blood pressure should be checked at least every 1-2 years. Ongoing high blood pressure should be treated with medicines if weight loss and exercise are not effective.  If you are 45-79 years old, ask your health care provider if   you should take aspirin to prevent heart disease.  Diabetes screening involves taking a blood sample to check your fasting blood sugar level. This should be done once every 3 years after age 45 if you are at a normal weight and without risk factors for diabetes. Testing should be considered at a younger age or be carried out more frequently if you are overweight and have at least 1 risk factor for diabetes.  Colorectal cancer can be detected and often prevented. Most routine colorectal cancer screening begins at the age of 50  and continues through age 75. However, your health care provider may recommend screening at an earlier age if you have risk factors for colon cancer. On a yearly basis, your health care provider may provide home test kits to check for hidden blood in the stool. A small camera at the end of a tube may be used to directly examine the colon (sigmoidoscopy or colonoscopy) to detect the earliest forms of colorectal cancer. Talk to your health care provider about this at age 50 when routine screening begins. A direct exam of the colon should be repeated every 5-10 years through age 75, unless early forms of precancerous polyps or small growths are found.  People who are at an increased risk for hepatitis B should be screened for this virus. You are considered at high risk for hepatitis B if:  You were born in a country where hepatitis B occurs often. Talk with your health care provider about which countries are considered high risk.  Your parents were born in a high-risk country and you have not received a shot to protect against hepatitis B (hepatitis B vaccine).  You have HIV or AIDS.  You use needles to inject street drugs.  You live with, or have sex with, someone who has hepatitis B.  You are a man who has sex with other men (MSM).  You get hemodialysis treatment.  You take certain medicines for conditions like cancer, organ transplantation, and autoimmune conditions.  Hepatitis C blood testing is recommended for all people born from 1945 through 1965 and any individual with known risk factors for hepatitis C.  Healthy men should no longer receive prostate-specific antigen (PSA) blood tests as part of routine cancer screening. Talk to your health care provider about prostate cancer screening.  Testicular cancer screening is not recommended for adolescents or adult males who have no symptoms. Screening includes self-exam, a health care provider exam, and other screening tests. Consult with your  health care provider about any symptoms you have or any concerns you have about testicular cancer.  Practice safe sex. Use condoms and avoid high-risk sexual practices to reduce the spread of sexually transmitted infections (STIs).  You should be screened for STIs, including gonorrhea and chlamydia if:  You are sexually active and are younger than 24 years.  You are older than 24 years, and your health care provider tells you that you are at risk for this type of infection.  Your sexual activity has changed since you were last screened, and you are at an increased risk for chlamydia or gonorrhea. Ask your health care provider if you are at risk.  If you are at risk of being infected with HIV, it is recommended that you take a prescription medicine daily to prevent HIV infection. This is called pre-exposure prophylaxis (PrEP). You are considered at risk if:  You are a man who has sex with other men (MSM).  You are a heterosexual man who   is sexually active with multiple partners.  You take drugs by injection.  You are sexually active with a partner who has HIV.  Talk with your health care provider about whether you are at high risk of being infected with HIV. If you choose to begin PrEP, you should first be tested for HIV. You should then be tested every 3 months for as long as you are taking PrEP.  Use sunscreen. Apply sunscreen liberally and repeatedly throughout the day. You should seek shade when your shadow is shorter than you. Protect yourself by wearing long sleeves, pants, a wide-brimmed hat, and sunglasses year round whenever you are outdoors.  Tell your health care provider of new moles or changes in moles, especially if there is a change in shape or color. Also, tell your health care provider if a mole is larger than the size of a pencil eraser.  A one-time screening for abdominal aortic aneurysm (AAA) and surgical repair of large AAAs by ultrasound is recommended for men aged  13-75 years who are current or former smokers.  Stay current with your vaccines (immunizations). Document Released: 10/23/2007 Document Revised: 05/01/2013 Document Reviewed: 09/21/2010 [facility] Patient Information 2015 [place], [place]. This information is not intended to replace advice given to you by your health care provider. Make sure you discuss any questions you have with your health care provider.   There are natural ways to boost your testosterone:  1. Lose Weight If you're overweight, shedding the excess pounds may increase your testosterone levels, according to multiple research. Overweight men are more likely to have low testosterone levels to begin with, so this is an important trick to increase your body's testosterone production when you need it most.   2. Strength Training    Strength training is also known to boost testosterone levels, provided you are doing so intensely enough. When strength training to boost testosterone, you'll want to increase the weight and lower your number of reps, and then focus on exercises that work a large number of muscles.  3. Optimize Your Vitamin D Levels Vitamin D, a steroid hormone, is essential for the healthy development of the nucleus of the sperm cell, and helps maintain semen quality and sperm count. Vitamin D also increases levels of testosterone, which may boost libido. In one study, overweight men who were given vitamin D supplements had a significant increase in testosterone levels after one year.  4. Reduce Stress When you're under a lot of stress, your body releases high levels of the stress hormone cortisol. This hormone actually blocks the effects of testosterone, presumably because, from a biological standpoint, testosterone-associated behaviors (mating, competing, aggression) may have lowered your chances of survival in an emergency (hence, the "fight or flight" response is dominant, courtesy of cortisol).  5. Limit or Eliminate  Sugar from Your Diet Testosterone levels decrease after you eat sugar, which is likely because the sugar leads to a high insulin level, another factor leading to low testosterone.  6. Eat Healthy Fats By healthy, this means not only mon- and polyunsaturated fats, like that found in avocadoes and nuts, but also saturated, as these are essential for building testosterone. Research shows that a diet with less than 40 percent of energy as fat (and that mainly from animal sources, i.e. saturated) lead to a decrease in testosterone levels.  It's important to understand that your body requires saturated fats from animal and vegetable sources (such as meat, dairy, certain oils, and tropical plants like coconut) for optimal functioning,  and if you neglect this important food group in favor of sugar, grains and other starchy carbs, your health and weight are almost guaranteed to suffer. Examples of healthy fats you can eat more of to give your testosterone levels a boost include:  Olives and Olive oil  Coconuts and coconut oil Butter made from organic milk  Raw nuts, such as, almonds or pecans Eggs Avocados   Meats Palm oil Unheated organic nut oils   7. "Testosterone boosters" containing Vitamin D-3, Niacin, Vitamin B-6, Vitamin B-12, Magnesium, Zinc, Selenium, D-Aspartic Acid, Fenugreed Seed Extract, Oystershell, [place] Extract, [place] Ginseng may be helpful as well.

## 2014-12-23 NOTE — Progress Notes (Signed)
Pre visit review using our clinic review tool, if applicable. No additional management support is needed unless otherwise documented below in the visit note. 

## 2014-12-23 NOTE — Assessment & Plan Note (Signed)
On Levothroid 

## 2014-12-23 NOTE — Progress Notes (Signed)
Subjective:    HPI  The pt is here for Well exam  The patient presents for a follow-up of  chronic hypertension, chronic dyslipidemia, hypogonadism   F/u R shoulder Melanoma 12/15 - surgery s/p - doing well; derm f/u q 6 mo  He is taking Lipitor 2-3 tabs per week    F/u depression - better now.  Wt Readings from Last 3 Encounters:  12/23/14 215 lb (97.523 kg)  08/19/14 213 lb (96.616 kg)  04/19/14 212 lb (96.163 kg)   BP Readings from Last 3 Encounters:  12/23/14 139/80  08/19/14 140/72  04/19/14 130/92    Review of Systems  Constitutional: Negative for appetite change, fatigue and unexpected weight change.  HENT: Negative for congestion, nosebleeds, sneezing, sore throat and trouble swallowing.   Eyes: Negative for itching and visual disturbance.  Respiratory: Negative for cough.   Cardiovascular: Negative for chest pain, palpitations and leg swelling.  Gastrointestinal: Negative for nausea, diarrhea, blood in stool and abdominal distention.  Genitourinary: Negative for frequency and hematuria.  Musculoskeletal: Negative for back pain, joint swelling, gait problem and neck pain.  Skin: Negative for rash.  Neurological: Negative for dizziness, tremors, speech difficulty and weakness.  Psychiatric/Behavioral: Negative for sleep disturbance, dysphoric mood and agitation. The patient is not nervous/anxious.        Objective:   Physical Exam  Constitutional: He is oriented to person, place, and time. He appears well-developed. No distress.  NAD  HENT:  Mouth/Throat: Oropharynx is clear and moist.  Eyes: Conjunctivae are normal. Pupils are equal, round, and reactive to light.  Neck: Normal range of motion. No JVD present. No thyromegaly present.  Cardiovascular: Normal rate, regular rhythm, normal heart sounds and intact distal pulses.  Exam reveals no gallop and no friction rub.   No murmur heard. Pulmonary/Chest: Effort normal and breath sounds normal. No  respiratory distress. He has no wheezes. He has no rales. He exhibits no tenderness.  Abdominal: Soft. Bowel sounds are normal. He exhibits no distension and no mass. There is no tenderness. There is no rebound and no guarding.  Genitourinary: Guaiac negative stool.  Prostate 1+  Musculoskeletal: Normal range of motion. He exhibits no edema or tenderness.  Lymphadenopathy:    He has no cervical adenopathy.  Neurological: He is alert and oriented to person, place, and time. He has normal reflexes. No cranial nerve deficit. He exhibits normal muscle tone. He displays a negative Romberg sign. Coordination and gait normal.  No meningeal signs  Skin: Skin is warm and dry. No rash noted.  Psychiatric: He has a normal mood and affect. His behavior is normal. Judgment and thought content normal.    Lab Results  Component Value Date   WBC 7.8 12/18/2014   HGB 15.6 12/18/2014   HCT 45.9 12/18/2014   PLT 265.0 12/18/2014   GLUCOSE 113* 12/18/2014   CHOL 212* 12/18/2014   TRIG * 12/18/2014    481.0 Triglyceride is over 400; calculations on Lipids are invalid.   HDL 32.90* 12/18/2014   LDLDIRECT 88.0 12/18/2014   LDLCALC 146* 09/17/2013   ALT 18 12/18/2014   AST 16 12/18/2014   NA 142 12/18/2014   K 5.8* 12/18/2014   CL 105 12/18/2014   CREATININE 1.21 12/18/2014   BUN 14 12/18/2014   CO2 29 12/18/2014   TSH 0.05* 08/12/2014   PSA 2.75 12/18/2014   INR 1.00 10/04/2011   HGBA1C 5.9 12/18/2014         Assessment & Plan:  Patient ID: Donald Phillips, male   DOB: 1945-11-10, 69 y.o.   MRN: [id number]

## 2014-12-23 NOTE — Assessment & Plan Note (Signed)
  On diet  

## 2014-12-23 NOTE — Assessment & Plan Note (Signed)
Here for medicare wellness/physical  Diet: heart healthy  Physical activity: not sedentary  Depression/mood screen: negative  Hearing: intact to whispered voice  Visual acuity: grossly normal, performs annual eye exam  ADLs: capable  Fall risk: none  Home safety: good  Cognitive evaluation: intact to orientation, naming, recall and repetition  EOL planning: adv directives, full code/ I agree  I have personally reviewed and have noted  1. The patient's medical, surgical and social history  2. Their use of alcohol, tobacco or illicit drugs  3. Their current medications and supplements  4. The patient's functional ability including ADL's, fall risks, home safety risks and hearing or visual impairment.  5. Diet and physical activities  6. Evidence for depression or mood disorders 7. The roster of all physicians providing medical care to patient - is listed in the Snapshot section of the chart and reviewed today.    Today patient counseled on age appropriate routine health concerns for screening and prevention, each reviewed and up to date or declined. Immunizations reviewed and up to date or declined. Labs ordered and reviewed. Risk factors for depression reviewed and negative. Hearing function and visual acuity are intact. ADLs screened and addressed as needed. Functional ability and level of safety reviewed and appropriate. Education, counseling and referrals performed based on assessed risks today. Patient provided with a copy of personalized plan for preventive services.   Colonoscopy ?2010 in [place] [facility] advised

## 2014-12-23 NOTE — Assessment & Plan Note (Signed)
Try to use Lipitor qd Add Krill oil

## 2014-12-23 NOTE — Assessment & Plan Note (Signed)
Monitoring

## 2015-01-20 ENCOUNTER — Other Ambulatory Visit: Payer: Self-pay | Admitting: Internal Medicine

## 2015-02-03 DIAGNOSIS — B351 Tinea unguium: Secondary | ICD-10-CM | POA: Diagnosis not present

## 2015-02-03 DIAGNOSIS — M79675 Pain in left toe(s): Secondary | ICD-10-CM | POA: Diagnosis not present

## 2015-02-07 DIAGNOSIS — H47323 Drusen of optic disc, bilateral: Secondary | ICD-10-CM | POA: Diagnosis not present

## 2015-02-07 DIAGNOSIS — H2513 Age-related nuclear cataract, bilateral: Secondary | ICD-10-CM | POA: Diagnosis not present

## 2015-04-13 DIAGNOSIS — H1132 Conjunctival hemorrhage, left eye: Secondary | ICD-10-CM | POA: Diagnosis not present

## 2015-04-13 DIAGNOSIS — H579 Unspecified disorder of eye and adnexa: Secondary | ICD-10-CM | POA: Diagnosis not present

## 2015-04-25 ENCOUNTER — Encounter: Payer: Self-pay | Admitting: Internal Medicine

## 2015-04-25 ENCOUNTER — Ambulatory Visit (INDEPENDENT_AMBULATORY_CARE_PROVIDER_SITE_OTHER): Payer: Medicare Other | Admitting: Internal Medicine

## 2015-04-25 ENCOUNTER — Other Ambulatory Visit (INDEPENDENT_AMBULATORY_CARE_PROVIDER_SITE_OTHER): Payer: Medicare Other

## 2015-04-25 VITALS — BP 140/74 | HR 70 | Wt 213.0 lb

## 2015-04-25 DIAGNOSIS — E538 Deficiency of other specified B group vitamins: Secondary | ICD-10-CM | POA: Diagnosis not present

## 2015-04-25 DIAGNOSIS — E034 Atrophy of thyroid (acquired): Secondary | ICD-10-CM

## 2015-04-25 DIAGNOSIS — I10 Essential (primary) hypertension: Secondary | ICD-10-CM

## 2015-04-25 DIAGNOSIS — E785 Hyperlipidemia, unspecified: Secondary | ICD-10-CM

## 2015-04-25 DIAGNOSIS — E559 Vitamin D deficiency, unspecified: Secondary | ICD-10-CM | POA: Diagnosis not present

## 2015-04-25 DIAGNOSIS — E038 Other specified hypothyroidism: Secondary | ICD-10-CM

## 2015-04-25 LAB — BASIC METABOLIC PANEL
BUN: 13 mg/dL (ref 6–23)
CO2: 26 mEq/L (ref 19–32)
Calcium: 9.6 mg/dL (ref 8.4–10.5)
Chloride: 107 mEq/L (ref 96–112)
Creatinine, Ser: 1.24 mg/dL (ref 0.40–1.50)
GFR: 61.57 mL/min (ref >60.00)
GLUCOSE: 111 mg/dL — AB (ref 70–99)
POTASSIUM: 5.3 meq/L — AB (ref 3.5–5.1)
SODIUM: 142 meq/L (ref 135–145)

## 2015-04-25 LAB — HEPATIC FUNCTION PANEL
ALT: 21 U/L (ref 0–53)
AST: 19 U/L (ref 0–37)
Albumin: 4 g/dL (ref 3.5–5.2)
Alkaline Phosphatase: 96 U/L (ref 39–117)
BILIRUBIN TOTAL: 0.6 mg/dL (ref 0.2–1.2)
Bilirubin, Direct: 0.1 mg/dL (ref 0.0–0.3)
TOTAL PROTEIN: 7.2 g/dL (ref 6.0–8.3)

## 2015-04-25 LAB — LIPID PANEL
Cholesterol: 208 mg/dL — ABNORMAL HIGH (ref 0–200)
HDL: 34.1 mg/dL — ABNORMAL LOW (ref >39.00)
Total CHOL/HDL Ratio: 6

## 2015-04-25 LAB — LDL CHOLESTEROL, DIRECT: LDL DIRECT: 75 mg/dL

## 2015-04-25 NOTE — Progress Notes (Signed)
Subjective:  Patient ID: Donald Phillips, male    DOB: 06-Mar-1946  Age: 69 y.o. MRN: [id number]  CC: No chief complaint on file.   HPI Mayo Ao JR presents for dyslipidemia, B12 def, hypothyroidism f/u  Outpatient Prescriptions Prior to Visit  Medication Sig Dispense Refill  . atorvastatin (LIPITOR) 20 MG tablet Take 1 tablet (20 mg total) by mouth daily. 30 tablet 11  . Biotin 5000 MCG TABS Take 1 tablet by mouth daily.    Marland Kitchen buPROPion (WELLBUTRIN XL) 300 MG 24 hr tablet Take 1 tablet (300 mg total) by mouth daily. 90 tablet 3  . Cholecalciferol (VITAMIN D3) 1000 UNITS tablet Take 1,000 Units by mouth daily.      . Cyanocobalamin (VITAMIN B-12) 1000 MCG SUBL Place 1 tablet (1,000 mcg total) under the tongue daily. 100 tablet 3  . fexofenadine-pseudoephedrine (ALLEGRA-D) 60-120 MG per tablet Take 1 tablet by mouth 2 (two) times daily as needed.      Marland Kitchen levothyroxine (SYNTHROID, LEVOTHROID) 175 MCG tablet TAKE ONE TABLET BY MOUTH ONCE DAILY BEFORE BREAKFAST 30 tablet 3  . MEGARED OMEGA-3 KRILL OIL 500 MG CAPS Take 1 capsule by mouth every morning. 100 capsule 3  . metroNIDAZOLE (METROGEL) 1 % gel at bedtime.    . vardenafil (LEVITRA) 20 MG tablet Take 1 tablet (20 mg total) by mouth daily as needed. 10 tablet 11   No facility-administered medications prior to visit.    ROS Review of Systems  Constitutional: Negative for appetite change, fatigue and unexpected weight change.  HENT: Negative for congestion, nosebleeds, sneezing, sore throat and trouble swallowing.   Eyes: Negative for itching and visual disturbance.  Respiratory: Negative for cough.   Cardiovascular: Negative for chest pain, palpitations and leg swelling.  Gastrointestinal: Negative for nausea, diarrhea, blood in stool and abdominal distention.  Genitourinary: Negative for frequency and hematuria.  Musculoskeletal: Negative for back pain, joint swelling, gait problem and neck pain.  Skin: Negative for rash.    Neurological: Negative for dizziness, tremors, speech difficulty and weakness.  Psychiatric/Behavioral: Negative for suicidal ideas, sleep disturbance, dysphoric mood and agitation. The patient is not nervous/anxious.     Objective:  BP 140/74 mmHg  Pulse 70  Wt 213 lb (96.616 kg)  SpO2 96%  BP Readings from Last 3 Encounters:  04/25/15 140/74  12/23/14 139/80  08/19/14 140/72    Wt Readings from Last 3 Encounters:  04/25/15 213 lb (96.616 kg)  12/23/14 215 lb (97.523 kg)  08/19/14 213 lb (96.616 kg)    Physical Exam  Constitutional: He is oriented to person, place, and time. He appears well-developed. No distress.  NAD  HENT:  Mouth/Throat: Oropharynx is clear and moist.  Eyes: Conjunctivae are normal. Pupils are equal, round, and reactive to light.  Neck: Normal range of motion. No JVD present. No thyromegaly present.  Cardiovascular: Normal rate, regular rhythm, normal heart sounds and intact distal pulses.  Exam reveals no gallop and no friction rub.   No murmur heard. Pulmonary/Chest: Effort normal and breath sounds normal. No respiratory distress. He has no wheezes. He has no rales. He exhibits no tenderness.  Abdominal: Soft. Bowel sounds are normal. He exhibits no distension and no mass. There is no tenderness. There is no rebound and no guarding.  Musculoskeletal: Normal range of motion. He exhibits no edema or tenderness.  Lymphadenopathy:    He has no cervical adenopathy.  Neurological: He is alert and oriented to person, place, and time. He has  normal reflexes. No cranial nerve deficit. He exhibits normal muscle tone. He displays a negative Romberg sign. Coordination and gait normal.  Skin: Skin is warm and dry. No rash noted.  Psychiatric: He has a normal mood and affect. His behavior is normal. Judgment and thought content normal.    Lab Results  Component Value Date   WBC 7.8 12/18/2014   HGB 15.6 12/18/2014   HCT 45.9 12/18/2014   PLT 265.0 12/18/2014    GLUCOSE 111* 04/25/2015   CHOL 208* 04/25/2015   TRIG * 04/25/2015    458.0 Triglyceride is over 400; calculations on Lipids are invalid.   HDL 34.10* 04/25/2015   LDLDIRECT 75.0 04/25/2015   LDLCALC 146* 09/17/2013   ALT 21 04/25/2015   AST 19 04/25/2015   NA 142 04/25/2015   K 5.3* 04/25/2015   CL 107 04/25/2015   CREATININE 1.24 04/25/2015   BUN 13 04/25/2015   CO2 26 04/25/2015   TSH 0.05* 08/12/2014   PSA 2.75 12/18/2014   INR 1.00 10/04/2011   HGBA1C 5.9 12/18/2014    Dg Chest 2 View  02/19/2013  CLINICAL DATA:  Bronchitis. Three month followup. EXAM: CHEST  2 VIEW COMPARISON:  10/04/2011. FINDINGS: The heart size and mediastinal contours are within normal limits. Both lungs are clear. The bony thorax is demineralized but intact. IMPRESSION: No active cardiopulmonary disease. Electronically Signed   By: Lajean Manes M.D.   On: 02/19/2013 16:31    Assessment & Plan:   There are no diagnoses linked to this encounter. I am having Mr. Rusek maintain his fexofenadine-pseudoephedrine, cholecalciferol, Vitamin B-12, vardenafil, Biotin, buPROPion, atorvastatin, metroNIDAZOLE, MEGARED OMEGA-3 KRILL OIL, and levothyroxine.  No orders of the defined types were placed in this encounter.     Follow-up: Return in about 4 months (around 08/24/2015) for a follow-up visit.  Walker Kehr, MD

## 2015-04-25 NOTE — Progress Notes (Signed)
Pre visit review using our clinic review tool, if applicable. No additional management support is needed unless otherwise documented below in the visit note. 

## 2015-04-26 NOTE — Assessment & Plan Note (Signed)
On B12 

## 2015-04-26 NOTE — Assessment & Plan Note (Signed)
On Levothroid 

## 2015-04-26 NOTE — Assessment & Plan Note (Signed)
On Vit D 

## 2015-04-26 NOTE — Assessment & Plan Note (Signed)
NAS diet 

## 2015-04-27 MED ORDER — FENOFIBRATE 145 MG PO TABS: 145.0000 mg | ORAL_TABLET | Freq: Every day | ORAL | Status: DC

## 2015-05-26 DIAGNOSIS — L579 Skin changes due to chronic exposure to nonionizing radiation, unspecified: Secondary | ICD-10-CM | POA: Diagnosis not present

## 2015-05-26 DIAGNOSIS — L570 Actinic keratosis: Secondary | ICD-10-CM | POA: Diagnosis not present

## 2015-05-26 DIAGNOSIS — L814 Other melanin hyperpigmentation: Secondary | ICD-10-CM | POA: Diagnosis not present

## 2015-05-26 DIAGNOSIS — Z85820 Personal history of malignant melanoma of skin: Secondary | ICD-10-CM | POA: Diagnosis not present

## 2015-06-07 ENCOUNTER — Other Ambulatory Visit: Payer: Self-pay | Admitting: Internal Medicine

## 2015-06-09 DIAGNOSIS — B9789 Other viral agents as the cause of diseases classified elsewhere: Secondary | ICD-10-CM | POA: Diagnosis not present

## 2015-06-09 DIAGNOSIS — J069 Acute upper respiratory infection, unspecified: Secondary | ICD-10-CM | POA: Diagnosis not present

## 2015-07-04 DIAGNOSIS — H2513 Age-related nuclear cataract, bilateral: Secondary | ICD-10-CM | POA: Diagnosis not present

## 2015-07-04 DIAGNOSIS — H47323 Drusen of optic disc, bilateral: Secondary | ICD-10-CM | POA: Diagnosis not present

## 2015-09-01 ENCOUNTER — Ambulatory Visit: Payer: Medicare Other | Admitting: Internal Medicine

## 2015-09-01 ENCOUNTER — Telehealth: Payer: Self-pay | Admitting: Internal Medicine

## 2015-09-01 DIAGNOSIS — R7309 Other abnormal glucose: Secondary | ICD-10-CM

## 2015-09-01 DIAGNOSIS — E785 Hyperlipidemia, unspecified: Secondary | ICD-10-CM

## 2015-09-01 NOTE — Telephone Encounter (Signed)
Done - sorry! Thx

## 2015-09-01 NOTE — Telephone Encounter (Signed)
Patient states went to lab this morning but lab did not have orders.  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  Please call back in regards to if he is going to need labs.

## 2015-09-02 NOTE — Telephone Encounter (Signed)
Left detailed mess informing pt.  

## 2015-09-04 ENCOUNTER — Other Ambulatory Visit (INDEPENDENT_AMBULATORY_CARE_PROVIDER_SITE_OTHER): Payer: Medicare Other

## 2015-09-04 DIAGNOSIS — R7309 Other abnormal glucose: Secondary | ICD-10-CM

## 2015-09-04 DIAGNOSIS — E785 Hyperlipidemia, unspecified: Secondary | ICD-10-CM

## 2015-09-04 LAB — BASIC METABOLIC PANEL
BUN: 14 mg/dL (ref 6–23)
CALCIUM: 9.8 mg/dL (ref 8.4–10.5)
CO2: 28 mEq/L (ref 19–32)
CREATININE: 1.2 mg/dL (ref 0.40–1.50)
Chloride: 106 mEq/L (ref 96–112)
GFR: 63.88 mL/min (ref >60.00)
Glucose, Bld: 110 mg/dL — ABNORMAL HIGH (ref 70–99)
Potassium: 5.2 mEq/L — ABNORMAL HIGH (ref 3.5–5.1)
Sodium: 142 mEq/L (ref 135–145)

## 2015-09-04 LAB — HEMOGLOBIN A1C: HEMOGLOBIN A1C: 5.9 % (ref 4.6–6.5)

## 2015-09-04 LAB — HEPATIC FUNCTION PANEL
ALBUMIN: 4 g/dL (ref 3.5–5.2)
ALK PHOS: 81 U/L (ref 39–117)
ALT: 19 U/L (ref 0–53)
AST: 18 U/L (ref 0–37)
Bilirubin, Direct: 0.1 mg/dL (ref 0.0–0.3)
TOTAL PROTEIN: 7.5 g/dL (ref 6.0–8.3)
Total Bilirubin: 0.7 mg/dL (ref 0.2–1.2)

## 2015-09-04 LAB — LDL CHOLESTEROL, DIRECT: LDL DIRECT: 117 mg/dL

## 2015-09-04 LAB — LIPID PANEL
CHOL/HDL RATIO: 7
CHOLESTEROL: 270 mg/dL — AB (ref 0–200)
HDL: 36.8 mg/dL — AB (ref >39.00)

## 2015-09-12 ENCOUNTER — Ambulatory Visit (INDEPENDENT_AMBULATORY_CARE_PROVIDER_SITE_OTHER): Payer: Medicare Other | Admitting: Internal Medicine

## 2015-09-12 ENCOUNTER — Encounter: Payer: Self-pay | Admitting: Internal Medicine

## 2015-09-12 VITALS — BP 140/78 | HR 82 | Wt 211.0 lb

## 2015-09-12 DIAGNOSIS — E538 Deficiency of other specified B group vitamins: Secondary | ICD-10-CM | POA: Diagnosis not present

## 2015-09-12 DIAGNOSIS — E034 Atrophy of thyroid (acquired): Secondary | ICD-10-CM

## 2015-09-12 DIAGNOSIS — E038 Other specified hypothyroidism: Secondary | ICD-10-CM

## 2015-09-12 DIAGNOSIS — I10 Essential (primary) hypertension: Secondary | ICD-10-CM | POA: Diagnosis not present

## 2015-09-12 MED ORDER — TADALAFIL 20 MG PO TABS: 20.0000 mg | ORAL_TABLET | Freq: Every day | ORAL | Status: DC | PRN

## 2015-09-12 MED ORDER — FENOFIBRATE 145 MG PO TABS: 145.0000 mg | ORAL_TABLET | Freq: Every day | ORAL | Status: DC

## 2015-09-12 MED ORDER — ATORVASTATIN CALCIUM 20 MG PO TABS: 20.0000 mg | ORAL_TABLET | Freq: Every day | ORAL | Status: DC

## 2015-09-12 NOTE — Assessment & Plan Note (Signed)
On Levothroid. FT4 - WNL 

## 2015-09-12 NOTE — Progress Notes (Signed)
Subjective:  Patient ID: Donald Phillips, male    DOB: 27-Apr-1946  Age: 70 y.o. MRN: [id number]  CC: No chief complaint on file.   HPI Mayo Ao JR presents for HTN, dyslipidemia, ED f/u. Not taking Lipitor for ? reason  Outpatient Prescriptions Prior to Visit  Medication Sig Dispense Refill  . atorvastatin (LIPITOR) 20 MG tablet Take 1 tablet (20 mg total) by mouth daily. 30 tablet 11  . Biotin 5000 MCG TABS Take 1 tablet by mouth daily.    Marland Kitchen buPROPion (WELLBUTRIN XL) 300 MG 24 hr tablet Take 1 tablet (300 mg total) by mouth daily. 90 tablet 3  . Cholecalciferol (VITAMIN D3) 1000 UNITS tablet Take 1,000 Units by mouth daily.      . Cyanocobalamin (VITAMIN B-12) 1000 MCG SUBL Place 1 tablet (1,000 mcg total) under the tongue daily. 100 tablet 3  . fenofibrate (TRICOR) 145 MG tablet Take 1 tablet (145 mg total) by mouth daily. 30 tablet 11  . fexofenadine-pseudoephedrine (ALLEGRA-D) 60-120 MG per tablet Take 1 tablet by mouth 2 (two) times daily as needed.      Marland Kitchen levothyroxine (SYNTHROID, LEVOTHROID) 175 MCG tablet TAKE ONE TABLET BY MOUTH ONCE DAILY BEFORE  BREAKFAST 30 tablet 6  . MEGARED OMEGA-3 KRILL OIL 500 MG CAPS Take 1 capsule by mouth every morning. 100 capsule 3  . metroNIDAZOLE (METROGEL) 1 % gel at bedtime.    . vardenafil (LEVITRA) 20 MG tablet Take 1 tablet (20 mg total) by mouth daily as needed. 10 tablet 11   No facility-administered medications prior to visit.    ROS Review of Systems  Constitutional: Negative for appetite change, fatigue and unexpected weight change.  HENT: Negative for congestion, nosebleeds, sneezing, sore throat and trouble swallowing.   Eyes: Negative for itching and visual disturbance.  Respiratory: Negative for cough.   Cardiovascular: Negative for chest pain, palpitations and leg swelling.  Gastrointestinal: Negative for nausea, diarrhea, blood in stool and abdominal distention.  Genitourinary: Negative for frequency and hematuria.    Musculoskeletal: Negative for back pain, joint swelling, gait problem and neck pain.  Skin: Negative for rash.  Neurological: Negative for dizziness, tremors, speech difficulty and weakness.  Psychiatric/Behavioral: Negative for sleep disturbance, dysphoric mood and agitation. The patient is not nervous/anxious.     Objective:  BP 140/78 mmHg  Pulse 82  Wt 211 lb (95.709 kg)  SpO2 92%  BP Readings from Last 3 Encounters:  09/12/15 140/78  04/25/15 140/74  12/23/14 139/80    Wt Readings from Last 3 Encounters:  09/12/15 211 lb (95.709 kg)  04/25/15 213 lb (96.616 kg)  12/23/14 215 lb (97.523 kg)    Physical Exam  Constitutional: He is oriented to person, place, and time. He appears well-developed. No distress.  NAD  HENT:  Mouth/Throat: Oropharynx is clear and moist.  Eyes: Conjunctivae are normal. Pupils are equal, round, and reactive to light.  Neck: Normal range of motion. No JVD present. No thyromegaly present.  Cardiovascular: Normal rate, regular rhythm, normal heart sounds and intact distal pulses.  Exam reveals no gallop and no friction rub.   No murmur heard. Pulmonary/Chest: Effort normal and breath sounds normal. No respiratory distress. He has no wheezes. He has no rales. He exhibits no tenderness.  Abdominal: Soft. Bowel sounds are normal. He exhibits no distension and no mass. There is no tenderness. There is no rebound and no guarding.  Musculoskeletal: Normal range of motion. He exhibits no edema or tenderness.  Lymphadenopathy:    He has no cervical adenopathy.  Neurological: He is alert and oriented to person, place, and time. He has normal reflexes. No cranial nerve deficit. He exhibits normal muscle tone. He displays a negative Romberg sign. Coordination and gait normal.  Skin: Skin is warm and dry. No rash noted.  Psychiatric: He has a normal mood and affect. His behavior is normal. Judgment and thought content normal.    Lab Results  Component Value  Date   WBC 7.8 12/18/2014   HGB 15.6 12/18/2014   HCT 45.9 12/18/2014   PLT 265.0 12/18/2014   GLUCOSE 110* 09/04/2015   CHOL 270* 09/04/2015   TRIG * 09/04/2015    420.0 Triglyceride is over 400; calculations on Lipids are invalid.   HDL 36.80* 09/04/2015   LDLDIRECT 117.0 09/04/2015   LDLCALC 146* 09/17/2013   ALT 19 09/04/2015   AST 18 09/04/2015   NA 142 09/04/2015   K 5.2* 09/04/2015   CL 106 09/04/2015   CREATININE 1.20 09/04/2015   BUN 14 09/04/2015   CO2 28 09/04/2015   TSH 0.05* 08/12/2014   PSA 2.75 12/18/2014   INR 1.00 10/04/2011   HGBA1C 5.9 09/04/2015    Dg Chest 2 View  02/19/2013  CLINICAL DATA:  Bronchitis. Three month followup. EXAM: CHEST  2 VIEW COMPARISON:  10/04/2011. FINDINGS: The heart size and mediastinal contours are within normal limits. Both lungs are clear. The bony thorax is demineralized but intact. IMPRESSION: No active cardiopulmonary disease. Electronically Signed   By: Lajean Manes M.D.   On: 02/19/2013 16:31    Assessment & Plan:   Diagnoses and all orders for this visit:  Hypothyroidism due to acquired atrophy of thyroid  B12 deficiency   I am having Mr. Nickell maintain his fexofenadine-pseudoephedrine, cholecalciferol, Vitamin B-12, vardenafil, Biotin, buPROPion, atorvastatin, metroNIDAZOLE, MEGARED OMEGA-3 KRILL OIL, fenofibrate, and levothyroxine.  No orders of the defined types were placed in this encounter.     Follow-up: No Follow-up on file.  Walker Kehr, MD

## 2015-09-12 NOTE — Assessment & Plan Note (Signed)
NAS 

## 2015-09-12 NOTE — Progress Notes (Signed)
Pre visit review using our clinic review tool, if applicable. No additional management support is needed unless otherwise documented below in the visit note. 

## 2015-09-12 NOTE — Assessment & Plan Note (Signed)
On B12 

## 2015-09-29 DIAGNOSIS — D485 Neoplasm of uncertain behavior of skin: Secondary | ICD-10-CM | POA: Diagnosis not present

## 2015-09-29 DIAGNOSIS — L719 Rosacea, unspecified: Secondary | ICD-10-CM | POA: Diagnosis not present

## 2015-09-29 DIAGNOSIS — C44319 Basal cell carcinoma of skin of other parts of face: Secondary | ICD-10-CM | POA: Diagnosis not present

## 2015-09-29 DIAGNOSIS — D492 Neoplasm of unspecified behavior of bone, soft tissue, and skin: Secondary | ICD-10-CM | POA: Diagnosis not present

## 2015-09-29 DIAGNOSIS — L720 Epidermal cyst: Secondary | ICD-10-CM | POA: Diagnosis not present

## 2015-10-08 DIAGNOSIS — C44319 Basal cell carcinoma of skin of other parts of face: Secondary | ICD-10-CM | POA: Diagnosis not present

## 2015-10-13 DIAGNOSIS — C44319 Basal cell carcinoma of skin of other parts of face: Secondary | ICD-10-CM | POA: Diagnosis not present

## 2015-12-08 DIAGNOSIS — L820 Inflamed seborrheic keratosis: Secondary | ICD-10-CM | POA: Diagnosis not present

## 2015-12-08 DIAGNOSIS — L579 Skin changes due to chronic exposure to nonionizing radiation, unspecified: Secondary | ICD-10-CM | POA: Diagnosis not present

## 2015-12-08 DIAGNOSIS — L821 Other seborrheic keratosis: Secondary | ICD-10-CM | POA: Diagnosis not present

## 2015-12-08 DIAGNOSIS — Z85828 Personal history of other malignant neoplasm of skin: Secondary | ICD-10-CM | POA: Diagnosis not present

## 2015-12-08 DIAGNOSIS — D1801 Hemangioma of skin and subcutaneous tissue: Secondary | ICD-10-CM | POA: Diagnosis not present

## 2015-12-08 DIAGNOSIS — L814 Other melanin hyperpigmentation: Secondary | ICD-10-CM | POA: Diagnosis not present

## 2015-12-08 DIAGNOSIS — L570 Actinic keratosis: Secondary | ICD-10-CM | POA: Diagnosis not present

## 2015-12-08 DIAGNOSIS — Z85820 Personal history of malignant melanoma of skin: Secondary | ICD-10-CM | POA: Diagnosis not present

## 2016-01-16 ENCOUNTER — Ambulatory Visit (INDEPENDENT_AMBULATORY_CARE_PROVIDER_SITE_OTHER): Payer: Medicare Other | Admitting: Internal Medicine

## 2016-01-16 ENCOUNTER — Other Ambulatory Visit (INDEPENDENT_AMBULATORY_CARE_PROVIDER_SITE_OTHER): Payer: Medicare Other

## 2016-01-16 ENCOUNTER — Encounter: Payer: Self-pay | Admitting: Internal Medicine

## 2016-01-16 VITALS — BP 130/72 | HR 68 | Temp 98.3°F | Wt 215.0 lb

## 2016-01-16 DIAGNOSIS — Z23 Encounter for immunization: Secondary | ICD-10-CM

## 2016-01-16 DIAGNOSIS — E034 Atrophy of thyroid (acquired): Secondary | ICD-10-CM | POA: Diagnosis not present

## 2016-01-16 DIAGNOSIS — E538 Deficiency of other specified B group vitamins: Secondary | ICD-10-CM | POA: Diagnosis not present

## 2016-01-16 DIAGNOSIS — I10 Essential (primary) hypertension: Secondary | ICD-10-CM | POA: Diagnosis not present

## 2016-01-16 DIAGNOSIS — N183 Chronic kidney disease, stage 3 unspecified: Secondary | ICD-10-CM

## 2016-01-16 DIAGNOSIS — F329 Major depressive disorder, single episode, unspecified: Secondary | ICD-10-CM

## 2016-01-16 DIAGNOSIS — E038 Other specified hypothyroidism: Secondary | ICD-10-CM

## 2016-01-16 DIAGNOSIS — F32A Depression, unspecified: Secondary | ICD-10-CM

## 2016-01-16 DIAGNOSIS — N320 Bladder-neck obstruction: Secondary | ICD-10-CM

## 2016-01-16 DIAGNOSIS — Z0000 Encounter for general adult medical examination without abnormal findings: Secondary | ICD-10-CM

## 2016-01-16 DIAGNOSIS — D751 Secondary polycythemia: Secondary | ICD-10-CM

## 2016-01-16 DIAGNOSIS — M79604 Pain in right leg: Secondary | ICD-10-CM

## 2016-01-16 DIAGNOSIS — E785 Hyperlipidemia, unspecified: Secondary | ICD-10-CM

## 2016-01-16 DIAGNOSIS — M79605 Pain in left leg: Secondary | ICD-10-CM

## 2016-01-16 LAB — LIPID PANEL
CHOLESTEROL: 170 mg/dL (ref 0–200)
HDL: 42.9 mg/dL (ref >39.00)
LDL CALC: 94 mg/dL (ref 0–99)
NonHDL: 126.66
TRIGLYCERIDES: 164 mg/dL — AB (ref 0.0–149.0)
Total CHOL/HDL Ratio: 4
VLDL: 32.8 mg/dL (ref 0.0–40.0)

## 2016-01-16 LAB — HEPATIC FUNCTION PANEL
ALT: 26 U/L (ref 0–53)
AST: 21 U/L (ref 0–37)
Albumin: 4.3 g/dL (ref 3.5–5.2)
Alkaline Phosphatase: 71 U/L (ref 39–117)
BILIRUBIN TOTAL: 0.6 mg/dL (ref 0.2–1.2)
Bilirubin, Direct: 0.2 mg/dL (ref 0.0–0.3)
TOTAL PROTEIN: 7.4 g/dL (ref 6.0–8.3)

## 2016-01-16 LAB — BASIC METABOLIC PANEL
BUN: 17 mg/dL (ref 6–23)
CHLORIDE: 108 meq/L (ref 96–112)
CO2: 29 meq/L (ref 19–32)
CREATININE: 1.38 mg/dL (ref 0.40–1.50)
Calcium: 9.2 mg/dL (ref 8.4–10.5)
GFR: 54.3 mL/min — ABNORMAL LOW (ref >60.00)
GLUCOSE: 109 mg/dL — AB (ref 70–99)
POTASSIUM: 5.1 meq/L (ref 3.5–5.1)
Sodium: 142 mEq/L (ref 135–145)

## 2016-01-16 MED ORDER — BUPROPION HCL ER (XL) 300 MG PO TB24: 300.0000 mg | ORAL_TABLET | Freq: Every day | ORAL | 3 refills | Status: DC

## 2016-01-16 MED ORDER — TERAZOSIN HCL 2 MG PO CAPS: 2.0000 mg | ORAL_CAPSULE | Freq: Every day | ORAL | 3 refills | Status: DC

## 2016-01-16 NOTE — Assessment & Plan Note (Signed)
Restart Wellbutrin 

## 2016-01-16 NOTE — Assessment & Plan Note (Signed)
CBC

## 2016-01-16 NOTE — Assessment & Plan Note (Signed)
On B12 

## 2016-01-16 NOTE — Assessment & Plan Note (Signed)
Lipitor and Tricor

## 2016-01-16 NOTE — Progress Notes (Signed)
Pre visit review using our clinic review tool, if applicable. No additional management support is needed unless otherwise documented below in the visit note. 

## 2016-01-16 NOTE — Assessment & Plan Note (Signed)
B knee OA - Dr Tamala Julian - will consult

## 2016-01-16 NOTE — Progress Notes (Signed)
Subjective:  Patient ID: Donald Phillips, male    DOB: February 28, 1946  Age: 70 y.o. MRN: [id number]  CC: No chief complaint on file.   HPI Donald Phillips presents for dyslipidemia, HTN, hypothyroidism. The pt wants to re-start Wellbutrin. C/o nocturia  Outpatient Medications Prior to Visit  Medication Sig Dispense Refill  . atorvastatin (LIPITOR) 20 MG tablet Take 1 tablet (20 mg total) by mouth daily. 90 tablet 3  . Biotin 5000 MCG TABS Take 1 tablet by mouth daily.    Marland Kitchen buPROPion (WELLBUTRIN XL) 300 MG 24 hr tablet Take 1 tablet (300 mg total) by mouth daily. 90 tablet 3  . Cholecalciferol (VITAMIN D3) 1000 UNITS tablet Take 1,000 Units by mouth daily.      . Cyanocobalamin (VITAMIN B-12) 1000 MCG SUBL Place 1 tablet (1,000 mcg total) under the tongue daily. 100 tablet 3  . fenofibrate (TRICOR) 145 MG tablet Take 1 tablet (145 mg total) by mouth daily. 90 tablet 3  . fexofenadine-pseudoephedrine (ALLEGRA-D) 60-120 MG per tablet Take 1 tablet by mouth 2 (two) times daily as needed.      Marland Kitchen levothyroxine (SYNTHROID, LEVOTHROID) 175 MCG tablet TAKE ONE TABLET BY MOUTH ONCE DAILY BEFORE  BREAKFAST 30 tablet 6  . MEGARED OMEGA-3 KRILL OIL 500 MG CAPS Take 1 capsule by mouth every morning. 100 capsule 3  . metroNIDAZOLE (METROGEL) 1 % gel at bedtime.    . tadalafil (CIALIS) 20 MG tablet Take 1 tablet (20 mg total) by mouth daily as needed for erectile dysfunction. 30 tablet 5   No facility-administered medications prior to visit.     ROS Review of Systems  Constitutional: Negative for appetite change, fatigue and unexpected weight change.  HENT: Negative for congestion, nosebleeds, sneezing, sore throat and trouble swallowing.   Eyes: Negative for itching and visual disturbance.  Respiratory: Negative for cough.   Cardiovascular: Negative for chest pain, palpitations and leg swelling.  Gastrointestinal: Negative for abdominal distention, blood in stool, diarrhea and nausea.    Genitourinary: Negative for frequency and hematuria.  Musculoskeletal: Negative for back pain, gait problem, joint swelling and neck pain.  Skin: Negative for rash.  Neurological: Negative for dizziness, tremors, speech difficulty and weakness.  Psychiatric/Behavioral: Negative for agitation, dysphoric mood, sleep disturbance and suicidal ideas. The patient is nervous/anxious.     Objective:  BP 130/72   Pulse 68   Temp 98.3 F (36.8 C) (Oral)   Wt 215 lb (97.5 kg)   SpO2 97%   BMI 30.85 kg/m   BP Readings from Last 3 Encounters:  01/16/16 130/72  09/12/15 140/78  04/25/15 140/74    Wt Readings from Last 3 Encounters:  01/16/16 215 lb (97.5 kg)  09/12/15 211 lb (95.7 kg)  04/25/15 213 lb (96.6 kg)    Physical Exam  Constitutional: He is oriented to person, place, and time. He appears well-developed. No distress.  NAD  HENT:  Mouth/Throat: Oropharynx is clear and moist.  Eyes: Conjunctivae are normal. Pupils are equal, round, and reactive to light.  Neck: Normal range of motion. No JVD present. No thyromegaly present.  Cardiovascular: Normal rate, regular rhythm, normal heart sounds and intact distal pulses.  Exam reveals no gallop and no friction rub.   No murmur heard. Pulmonary/Chest: Effort normal and breath sounds normal. No respiratory distress. He has no wheezes. He has no rales. He exhibits no tenderness.  Abdominal: Soft. Bowel sounds are normal. He exhibits no distension and no mass. There is  no tenderness. There is no rebound and no guarding.  Musculoskeletal: Normal range of motion. He exhibits no edema or tenderness.  Lymphadenopathy:    He has no cervical adenopathy.  Neurological: He is alert and oriented to person, place, and time. He has normal reflexes. No cranial nerve deficit. He exhibits normal muscle tone. He displays a negative Romberg sign. Coordination and gait normal.  Skin: Skin is warm and dry. No rash noted.  Psychiatric: He has a normal mood  and affect. His behavior is normal. Judgment and thought content normal.    Lab Results  Component Value Date   WBC 7.8 12/18/2014   HGB 15.6 12/18/2014   HCT 45.9 12/18/2014   PLT 265.0 12/18/2014   GLUCOSE 110 (H) 09/04/2015   CHOL 270 (H) 09/04/2015   TRIG (H) 09/04/2015    420.0 Triglyceride is over 400; calculations on Lipids are invalid.   HDL 36.80 (L) 09/04/2015   LDLDIRECT 117.0 09/04/2015   LDLCALC 146 (H) 09/17/2013   ALT 19 09/04/2015   AST 18 09/04/2015   NA 142 09/04/2015   K 5.2 (H) 09/04/2015   CL 106 09/04/2015   CREATININE 1.20 09/04/2015   BUN 14 09/04/2015   CO2 28 09/04/2015   TSH 0.05 (L) 08/12/2014   PSA 2.75 12/18/2014   INR 1.00 10/04/2011   HGBA1C 5.9 09/04/2015    Dg Chest 2 View  Result Date: 02/19/2013 CLINICAL DATA:  Bronchitis. Three month followup. EXAM: CHEST  2 VIEW COMPARISON:  10/04/2011. FINDINGS: The heart size and mediastinal contours are within normal limits. Both lungs are clear. The bony thorax is demineralized but intact. IMPRESSION: No active cardiopulmonary disease. Electronically Signed   By: Lajean Manes M.D.   On: 02/19/2013 16:31    Assessment & Plan:   There are no diagnoses linked to this encounter. I am having Mr. Callo maintain his fexofenadine-pseudoephedrine, cholecalciferol, Vitamin B-12, Biotin, buPROPion, metroNIDAZOLE, MEGARED OMEGA-3 KRILL OIL, levothyroxine, tadalafil, atorvastatin, and fenofibrate.  No orders of the defined types were placed in this encounter.    Follow-up: No Follow-up on file.  Walker Kehr, MD

## 2016-01-16 NOTE — Assessment & Plan Note (Signed)
Worse Hytrin

## 2016-01-16 NOTE — Assessment & Plan Note (Signed)
On Levothroid 

## 2016-01-16 NOTE — Assessment & Plan Note (Signed)
NAS diet Uraxatral

## 2016-01-16 NOTE — Assessment & Plan Note (Signed)
BMET 

## 2016-01-24 ENCOUNTER — Other Ambulatory Visit: Payer: Self-pay | Admitting: Internal Medicine

## 2016-02-06 ENCOUNTER — Ambulatory Visit: Payer: Medicare Other | Admitting: Family Medicine

## 2016-02-13 DIAGNOSIS — H2513 Age-related nuclear cataract, bilateral: Secondary | ICD-10-CM | POA: Diagnosis not present

## 2016-02-13 DIAGNOSIS — H47323 Drusen of optic disc, bilateral: Secondary | ICD-10-CM | POA: Diagnosis not present

## 2016-02-18 NOTE — Progress Notes (Signed)
[facility] [address], [address] [place] [address] Phone: [phone] Subjective:    I'm seeing this patient by the request  of:  Walker Kehr, MD   CC: Left knee pain   [id number]  Donald Phillips is a 70 y.o. male coming in with complaint of left knee pain. Patient actually states that the pain seems to be bilateral but left greater than right. Patient states that the left medial aspect of the knee seems to be the most severe. Seems to be a dull, throbbing aching pain. Patient denies any radiation. Denies any instability. States doing things such as going up and down stairs repetitively getting pain. Has not notice any swelling. Rates the severity of pain 5 out of 10.     Past Medical History:  Diagnosis Date  . Allergic rhinitis   . Asthma    remote hx  . Chronic kidney disease    stones  . Diverticulosis of colon   . Hemorrhoid   . Hyperlipidemia   . Hypertension   . Impotence of organic origin   . Other testicular hypofunction   . Unspecified hypothyroidism    Past Surgical History:  Procedure Laterality Date  . distal radial fracture  10/04/2011  . HEMORRHOID SURGERY    . INGUINAL HERNIA REPAIR    . vastectomy     Social History   Social History  . Marital status: Married    Spouse name: N/A  . Number of children: N/A  . Years of education: N/A   Occupational History  . 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   Social History Main Topics  . Smoking status: Former Research scientist (life sciences)  . Smokeless tobacco: Former Systems developer  . Alcohol use No  . Drug use: No  . Sexual activity: Yes   Other Topics Concern  . None   Social History Narrative   6 grandchildren.   Allergies  Allergen Reactions  . Enalapril     elev creat, potassium   Family History  Problem Relation Age of Onset  . Cancer      prostate  . Dementia Mother   . Cancer Father     prostate  . Heart disease Father     heart attack  . Stroke Father   .  Cancer Sister     Past medical history, social, surgical and family history all reviewed in electronic medical record.  No pertanent information unless stated regarding to the chief complaint.   Review of Systems: No headache, visual changes, nausea, vomiting, diarrhea, constipation, dizziness, abdominal pain, skin rash, fevers, chills, night sweats, weight loss, swollen lymph nodes, body aches, joint swelling, muscle aches, chest pain, shortness of breath, mood changes.   Objective  Blood pressure 128/70, pulse 78, weight 215 lb (97.5 kg), SpO2 96 %.  General: No apparent distress alert and oriented x3 mood and affect normal, dressed appropriately.  HEENT: Pupils equal, extraocular movements intact  Respiratory: Patient's speak in full sentences and does not appear short of breath  Cardiovascular: No lower extremity edema, non tender, no erythema  Skin: Warm dry intact with no signs of infection or rash on extremities or on axial skeleton.  Abdomen: Soft nontender  Neuro: Cranial nerves II through XII are intact, neurovascularly intact in all extremities with 2+ DTRs and 2+ pulses.  Lymph: No lymphadenopathy of posterior or anterior cervical chain or axillae bilaterally.  Gait normal with good balance and coordination.  MSK:  Non tender with full range of motion and good stability and symmetric strength and tone of shoulders, elbows, wrist, hip, knee and ankles bilaterally.  Knee: Left Arthritic changes noted with spurring of the medial joint line Tender over the medial joint line ROM full in flexion and extension and lower leg rotation. Instability noted with valgus force. Negative Mcmurray's, Apley's, and Thessalonian tests. Non painful patellar compression. Patellar glide with mild crepitus. Patellar and quadriceps tendons unremarkable. Hamstring and quadriceps strength is normal.  Contralateral knee very mild degenerative changes  MSK US performed of: Left This study was ordered,  performed, and interpreted by Charlann Boxer D.O.  Knee: Severe narrowing of the medial joint line noted. Patient does have a large chronic meniscal tear that is displaced. Spurring of the anterior medial tibia noted. s.  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  This included exercises focusing on stretching, strengthening, with significant focus on eccentric aspects. Action extension exercises given focusing on the vastus medialis oblique strengthening as well as hip abductor strengthening.  Proper technique shown and discussed handout in great detail with ATC.  All questions were discussed and answered.     Impression and Recommendations:     This case required medical decision making of moderate complexity.      Note: This dictation was prepared with Dragon dictation along with smaller phrase technology. Any transcriptional errors that result from this process are unintentional.

## 2016-02-19 ENCOUNTER — Encounter: Payer: Self-pay | Admitting: Family Medicine

## 2016-02-19 ENCOUNTER — Other Ambulatory Visit: Payer: Self-pay

## 2016-02-19 ENCOUNTER — Ambulatory Visit (INDEPENDENT_AMBULATORY_CARE_PROVIDER_SITE_OTHER): Payer: Medicare Other | Admitting: Family Medicine

## 2016-02-19 VITALS — BP 128/70 | HR 78 | Wt 215.0 lb

## 2016-02-19 DIAGNOSIS — M25562 Pain in left knee: Secondary | ICD-10-CM

## 2016-02-19 DIAGNOSIS — M1712 Unilateral primary osteoarthritis, left knee: Secondary | ICD-10-CM

## 2016-02-19 NOTE — Patient Instructions (Signed)
Good to see you.  Ice 20 minutes 2 times daily. Usually after activity and before bed. Exercises 3 times a week.  pennsaid pinkie amount topically 2 times daily as needed.  Vitamin D 2000 IUd aily  Turmeric 500mg  twice daily  Tart cherry extract any dose at night See me again in 4 weeks and if not better we will do injection and consider xrays and maybe physical therapy.  I have a lot of tricks up my sleeve.

## 2016-02-19 NOTE — Assessment & Plan Note (Signed)
Patient found to have degenerative medial meniscal narrowing. We discussed icing regimen. Patient given a trial of topical anti-inflammatories. We discussed over-the-counter medications. We'll see how patient response. If worsening symptoms we'll consider injection, formal physical therapy and further imaging.

## 2016-02-26 ENCOUNTER — Other Ambulatory Visit: Payer: Medicare Other

## 2016-02-26 DIAGNOSIS — M25562 Pain in left knee: Secondary | ICD-10-CM

## 2016-02-27 ENCOUNTER — Telehealth: Payer: Self-pay | Admitting: *Deleted

## 2016-02-27 NOTE — Telephone Encounter (Signed)
Pt left msg on triage stating Dr. Tamala Julian gave samples of pennsaid at [facility], and he would like rx called into his pharmacy...Johny Chess

## 2016-03-01 NOTE — Telephone Encounter (Signed)
Spoke to pt, advised him that medicare does not cover this med but I can leave samples at the front desk for him to come by & pick up.

## 2016-03-17 NOTE — Progress Notes (Signed)
[facility] [address] [place], [place] [address] Phone: [phone] Subjective:    I'm seeing this patient by the request  of:  Walker Kehr, MD   CC: Left knee pain   [id number]  Donald Phillips is a 70 y.o. male coming in with complaint of left knee pain.   Moderate to severe osteoarthritic changes. Seem to be mostly the medial compartment. Patient was to home exercises, icing protocol, as well as topical anti-inflammatories. Patient states doing much better overall. No instability, feel exercises has been helpful and pennsaid is great  Even went dancing last Friday and no pain.       Past Medical History:  Diagnosis Date  . Allergic rhinitis   . Asthma    remote hx  . Chronic kidney disease    stones  . Diverticulosis of colon   . Hemorrhoid   . Hyperlipidemia   . Hypertension   . Impotence of organic origin   . Other testicular hypofunction   . Unspecified hypothyroidism    Past Surgical History:  Procedure Laterality Date  . distal radial fracture  10/04/2011  . HEMORRHOID SURGERY    . INGUINAL HERNIA REPAIR    . vastectomy     Social History   Social History  . Marital status: Married    Spouse name: N/A  . Number of children: N/A  . Years of education: N/A   Occupational History  . 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   Social History Main Topics  . Smoking status: Former Research scientist (life sciences)  . Smokeless tobacco: Former Systems developer  . Alcohol use No  . Drug use: No  . Sexual activity: Yes   Other Topics Concern  . None   Social History Narrative   6 grandchildren.   Allergies  Allergen Reactions  . Enalapril     elev creat, potassium   Family History  Problem Relation Age of Onset  . Cancer      prostate  . Dementia Mother   . Cancer Father     prostate  . Heart disease Father     heart attack  . Stroke Father   . Cancer Sister     Past medical history, social, surgical and family history all  reviewed in electronic medical record.  No pertanent information unless stated regarding to the chief complaint.   Review of Systems: No headache, visual changes, nausea, vomiting, diarrhea, constipation, dizziness, abdominal pain, skin rash, fevers, chills, night sweats, weight loss, swollen lymph nodes, body aches, joint swelling, muscle aches, chest pain, shortness of breath, mood changes.   Objective  Blood pressure 132/72, pulse 61, height 5\' 9"  (1.753 m), weight 216 lb (98 kg), SpO2 97 %.  Systems examined below as of 03/18/16 General: NAD A&O x3 mood, affect normal  HEENT: Pupils equal, extraocular movements intact no nystagmus Respiratory: not short of breath at rest or with speaking Cardiovascular: No lower extremity edema, non tender Skin: Warm dry intact with no signs of infection or rash on extremities or on axial skeleton. Abdomen: Soft nontender, no masses Neuro: Cranial nerves  intact, neurovascularly intact in all extremities with 2+ DTRs and 2+ pulses. Lymph: No lymphadenopathy appreciated today  Gait normal with good balance and coordination.   MSK:  Non tender with full range of motion and good stability and symmetric strength and tone of shoulders, elbows, wrist, hip, and ankles bilaterally. Arthritic changes of other joints Knee: Left  Arthritic changes noted with spurring of the medial joint line Tender over the medial joint line but less  ROM full in flexion and extension and lower leg rotation. Instability noted with valgus force. Negative Mcmurray's, Apley's, and Thessalonian tests. painful patellar compression. Patellar glide with mild to moderate crepitus. Patellar and quadriceps tendons unremarkable. Hamstring and quadriceps strength is normal.  Contralateral knee very mild degenerative changes     Impression and Recommendations:     This case required medical decision making of moderate complexity.      Note: This dictation was prepared with Dragon  dictation along with smaller phrase technology. Any transcriptional errors that result from this process are unintentional.

## 2016-03-18 ENCOUNTER — Encounter: Payer: Self-pay | Admitting: Family Medicine

## 2016-03-18 ENCOUNTER — Ambulatory Visit (INDEPENDENT_AMBULATORY_CARE_PROVIDER_SITE_OTHER): Payer: Medicare Other | Admitting: Family Medicine

## 2016-03-18 DIAGNOSIS — M1712 Unilateral primary osteoarthritis, left knee: Secondary | ICD-10-CM

## 2016-03-18 DIAGNOSIS — D492 Neoplasm of unspecified behavior of bone, soft tissue, and skin: Secondary | ICD-10-CM | POA: Diagnosis not present

## 2016-03-18 DIAGNOSIS — L738 Other specified follicular disorders: Secondary | ICD-10-CM | POA: Diagnosis not present

## 2016-03-18 DIAGNOSIS — Z85828 Personal history of other malignant neoplasm of skin: Secondary | ICD-10-CM | POA: Diagnosis not present

## 2016-03-18 NOTE — Patient Instructions (Addendum)
Good to see you  I am glad we are making progress.  We have a lot of tricks if we need them.  Continue the exercises 2 times a week  Keep doing everything else pennsaid pinkie amount topically 2 times daily as needed.  See me when you need me.

## 2016-03-18 NOTE — Assessment & Plan Note (Signed)
Patient is doing much better at this time. No significant changes at this moment. Continue with conservative therapy. Follow-up as needed

## 2016-04-19 ENCOUNTER — Encounter: Payer: Self-pay | Admitting: Internal Medicine

## 2016-04-19 ENCOUNTER — Other Ambulatory Visit (INDEPENDENT_AMBULATORY_CARE_PROVIDER_SITE_OTHER): Payer: Medicare Other

## 2016-04-19 ENCOUNTER — Ambulatory Visit (INDEPENDENT_AMBULATORY_CARE_PROVIDER_SITE_OTHER): Payer: Medicare Other | Admitting: Internal Medicine

## 2016-04-19 VITALS — BP 160/80 | HR 67 | Temp 97.7°F | Wt 213.0 lb

## 2016-04-19 DIAGNOSIS — I10 Essential (primary) hypertension: Secondary | ICD-10-CM | POA: Diagnosis not present

## 2016-04-19 DIAGNOSIS — E538 Deficiency of other specified B group vitamins: Secondary | ICD-10-CM

## 2016-04-19 DIAGNOSIS — Z0000 Encounter for general adult medical examination without abnormal findings: Secondary | ICD-10-CM

## 2016-04-19 DIAGNOSIS — N183 Chronic kidney disease, stage 3 unspecified: Secondary | ICD-10-CM

## 2016-04-19 DIAGNOSIS — N320 Bladder-neck obstruction: Secondary | ICD-10-CM

## 2016-04-19 DIAGNOSIS — N529 Male erectile dysfunction, unspecified: Secondary | ICD-10-CM

## 2016-04-19 DIAGNOSIS — E782 Mixed hyperlipidemia: Secondary | ICD-10-CM | POA: Diagnosis not present

## 2016-04-19 DIAGNOSIS — D751 Secondary polycythemia: Secondary | ICD-10-CM | POA: Diagnosis not present

## 2016-04-19 LAB — CBC WITH DIFFERENTIAL/PLATELET
BASOS PCT: 0.7 % (ref 0.0–3.0)
Basophils Absolute: 0 10*3/uL (ref 0.0–0.1)
EOS ABS: 0.2 10*3/uL (ref 0.0–0.7)
Eosinophils Relative: 2.4 % (ref 0.0–5.0)
HCT: 43.9 % (ref 39.0–52.0)
Hemoglobin: 15.1 g/dL (ref 13.0–17.0)
Lymphocytes Relative: 25.9 % (ref 12.0–46.0)
Lymphs Abs: 1.9 10*3/uL (ref 0.7–4.0)
MCHC: 34.4 g/dL (ref 30.0–36.0)
MCV: 94.1 fl (ref 78.0–100.0)
MONO ABS: 0.6 10*3/uL (ref 0.1–1.0)
Monocytes Relative: 7.5 % (ref 3.0–12.0)
NEUTROS ABS: 4.7 10*3/uL (ref 1.4–7.7)
Neutrophils Relative %: 63.5 % (ref 43.0–77.0)
PLATELETS: 264 10*3/uL (ref 150.0–400.0)
RBC: 4.66 Mil/uL (ref 4.22–5.81)
RDW: 13.1 % (ref 11.5–15.5)
WBC: 7.5 10*3/uL (ref 4.0–10.5)

## 2016-04-19 LAB — URINALYSIS
BILIRUBIN URINE: NEGATIVE
HGB URINE DIPSTICK: NEGATIVE
Ketones, ur: NEGATIVE
Leukocytes, UA: NEGATIVE
Nitrite: NEGATIVE
Specific Gravity, Urine: >=1.03 — AB (ref 1.000–1.030)
Total Protein, Urine: NEGATIVE
URINE GLUCOSE: NEGATIVE
UROBILINOGEN UA: 0.2 (ref 0.0–1.0)
pH: 5.5 (ref 5.0–8.0)

## 2016-04-19 LAB — LIPID PANEL
CHOL/HDL RATIO: 5
Cholesterol: 222 mg/dL — ABNORMAL HIGH (ref 0–200)
HDL: 47 mg/dL (ref >39.00)
LDL Cholesterol: 138 mg/dL — ABNORMAL HIGH (ref 0–99)
NONHDL: 174.6
Triglycerides: 185 mg/dL — ABNORMAL HIGH (ref 0.0–149.0)
VLDL: 37 mg/dL (ref 0.0–40.0)

## 2016-04-19 LAB — HEPATIC FUNCTION PANEL
ALT: 22 U/L (ref 0–53)
AST: 20 U/L (ref 0–37)
Albumin: 4.3 g/dL (ref 3.5–5.2)
Alkaline Phosphatase: 64 U/L (ref 39–117)
BILIRUBIN DIRECT: 0.1 mg/dL (ref 0.0–0.3)
TOTAL PROTEIN: 7.1 g/dL (ref 6.0–8.3)
Total Bilirubin: 0.5 mg/dL (ref 0.2–1.2)

## 2016-04-19 LAB — BASIC METABOLIC PANEL
BUN: 19 mg/dL (ref 6–23)
CHLORIDE: 107 meq/L (ref 96–112)
CO2: 28 mEq/L (ref 19–32)
CREATININE: 1.42 mg/dL (ref 0.40–1.50)
Calcium: 9.6 mg/dL (ref 8.4–10.5)
GFR: 52.5 mL/min — ABNORMAL LOW (ref >60.00)
GLUCOSE: 112 mg/dL — AB (ref 70–99)
Potassium: 5.2 mEq/L — ABNORMAL HIGH (ref 3.5–5.1)
Sodium: 141 mEq/L (ref 135–145)

## 2016-04-19 LAB — TSH: TSH: 0.01 u[IU]/mL — AB (ref 0.35–4.50)

## 2016-04-19 LAB — PSA: PSA: 2.79 ng/mL (ref 0.10–4.00)

## 2016-04-19 MED ORDER — AMLODIPINE BESYLATE 5 MG PO TABS: 5.0000 mg | ORAL_TABLET | Freq: Every day | ORAL | 3 refills | Status: DC

## 2016-04-19 MED ORDER — SILDENAFIL CITRATE 100 MG PO TABS: 100.0000 mg | ORAL_TABLET | ORAL | 5 refills | Status: DC | PRN

## 2016-04-19 NOTE — Assessment & Plan Note (Signed)
Here for medicare wellness/physical  Diet: heart healthy  Physical activity: not sedentary  Depression/mood screen: negative  Hearing: a little decreased to whispered voice  Visual acuity: grossly normal w/glasses, performs annual eye exam  ADLs: capable  Fall risk: low to none  Home safety: good  Cognitive evaluation: intact to orientation, naming, recall and repetition  EOL planning: adv directives, full code/ I agree  I have personally reviewed and have noted  1. The patient's medical, surgical and social history  2. Their use of alcohol, tobacco or illicit drugs  3. Their current medications and supplements  4. The patient's functional ability including ADL's, fall risks, home safety risks and hearing or visual impairment.  5. Diet and physical activities  6. Evidence for depression or mood disorders 7. The roster of all physicians providing medical care to patient - is listed in the Snapshot section of the chart and reviewed today.    Today patient counseled on age appropriate routine health concerns for screening and prevention, each reviewed and up to date or declined. Immunizations reviewed and up to date or declined. Labs ordered and reviewed. Risk factors for depression reviewed and negative. Hearing function and visual acuity are intact. ADLs screened and addressed as needed. Functional ability and level of safety reviewed and appropriate. Education, counseling and referrals performed based on assessed risks today. Patient provided with a copy of personalized plan for preventive services.

## 2016-04-19 NOTE — Patient Instructions (Addendum)
Normal BP<130/85 Start Amlodipine if BP>150Health Maintenance, Male A healthy lifestyle and preventative care can promote health and wellness.  Maintain regular health, dental, and eye exams.  Eat a healthy diet. Foods like vegetables, fruits, whole grains, low-fat dairy products, and lean protein foods contain the nutrients you need and are low in calories. Decrease your intake of foods high in solid fats, added sugars, and salt. Get information about a proper diet from your health care provider, if necessary.  Regular physical exercise is one of the most important things you can do for your health. Most adults should get at least 150 minutes of moderate-intensity exercise (any activity that increases your heart rate and causes you to sweat) each week. In addition, most adults need muscle-strengthening exercises on 2 or more days a week.   Maintain a healthy weight. The body mass index (BMI) is a screening tool to identify possible weight problems. It provides an estimate of body fat based on height and weight. Your health care provider can find your BMI and can help you achieve or maintain a healthy weight. For males 20 years and older:  A BMI below 18.5 is considered underweight.  A BMI of 18.5 to 24.9 is normal.  A BMI of 25 to 29.9 is considered overweight.  A BMI of 30 and above is considered obese.  Maintain normal blood lipids and cholesterol by exercising and minimizing your intake of saturated fat. Eat a balanced diet with plenty of fruits and vegetables. Blood tests for lipids and cholesterol should begin at age 67 and be repeated every 5 years. If your lipid or cholesterol levels are high, you are over age 23, or you are at high risk for heart disease, you may need your cholesterol levels checked more frequently.Ongoing high lipid and cholesterol levels should be treated with medicines if diet and exercise are not working.  If you smoke, find out from your health care provider how  to quit. If you do not use tobacco, do not start.  Lung cancer screening is recommended for adults aged 77-80 years who are at high risk for developing lung cancer because of a history of smoking. A yearly low-dose CT scan of the lungs is recommended for people who have at least a 30-pack-year history of smoking and are current smokers or have quit within the past 15 years. A pack year of smoking is smoking an average of 1 pack of cigarettes a day for 1 year (for example, a 30-pack-year history of smoking could mean smoking 1 pack a day for 30 years or 2 packs a day for 15 years). Yearly screening should continue until the smoker has stopped smoking for at least 15 years. Yearly screening should be stopped for people who develop a health problem that would prevent them from having lung cancer treatment.  If you choose to drink alcohol, do not have more than 2 drinks per day. One drink is considered to be 12 oz (360 mL) of beer, 5 oz (150 mL) of wine, or 1.5 oz (45 mL) of liquor.  Avoid the use of street drugs. Do not share needles with anyone. Ask for help if you need support or instructions about stopping the use of drugs.  High blood pressure causes heart disease and increases the risk of stroke. High blood pressure is more likely to develop in:  People who have blood pressure in the end of the normal range (100-139/85-89 mm Hg).  People who are overweight or obese.  People  who are African American.  If you are 58-33 years of age, have your blood pressure checked every 3-5 years. If you are 62 years of age or older, have your blood pressure checked every year. You should have your blood pressure measured twice-once when you are at a hospital or clinic, and once when you are not at a hospital or clinic. Record the average of the two measurements. To check your blood pressure when you are not at a hospital or clinic, you can use:  An automated blood pressure machine at a pharmacy.  A home blood  pressure monitor.  If you are 30-9 years old, ask your health care provider if you should take aspirin to prevent heart disease.  Diabetes screening involves taking a blood sample to check your fasting blood sugar level. This should be done once every 3 years after age 44 if you are at a normal weight and without risk factors for diabetes. Testing should be considered at a younger age or be carried out more frequently if you are overweight and have at least 1 risk factor for diabetes.  Colorectal cancer can be detected and often prevented. Most routine colorectal cancer screening begins at the age of 71 and continues through age 50. However, your health care provider may recommend screening at an earlier age if you have risk factors for colon cancer. On a yearly basis, your health care provider may provide home test kits to check for hidden blood in the stool. A small camera at the end of a tube may be used to directly examine the colon (sigmoidoscopy or colonoscopy) to detect the earliest forms of colorectal cancer. Talk to your health care provider about this at age 65 when routine screening begins. A direct exam of the colon should be repeated every 5-10 years through age 34, unless early forms of precancerous polyps or small growths are found.  People who are at an increased risk for hepatitis B should be screened for this virus. You are considered at high risk for hepatitis B if:  You were born in a country where hepatitis B occurs often. Talk with your health care provider about which countries are considered high risk.  Your parents were born in a high-risk country and you have not received a shot to protect against hepatitis B (hepatitis B vaccine).  You have HIV or AIDS.  You use needles to inject street drugs.  You live with, or have sex with, someone who has hepatitis B.  You are a man who has sex with other men (MSM).  You get hemodialysis treatment.  You take certain medicines  for conditions like cancer, organ transplantation, and autoimmune conditions.  Hepatitis C blood testing is recommended for all people born from [birth year] through 1964 and any individual with known risk factors for hepatitis C.  Healthy men should no longer receive prostate-specific antigen (PSA) blood tests as part of routine cancer screening. Talk to your health care provider about prostate cancer screening.  Testicular cancer screening is not recommended for adolescents or adult males who have no symptoms. Screening includes self-exam, a health care provider exam, and other screening tests. Consult with your health care provider about any symptoms you have or any concerns you have about testicular cancer.  Practice safe sex. Use condoms and avoid high-risk sexual practices to reduce the spread of sexually transmitted infections (STIs).  You should be screened for STIs, including gonorrhea and chlamydia if:  You are sexually active and are younger  than 24 years.  You are older than 24 years, and your health care provider tells you that you are at risk for this type of infection.  Your sexual activity has changed since you were last screened, and you are at an increased risk for chlamydia or gonorrhea. Ask your health care provider if you are at risk.  If you are at risk of being infected with HIV, it is recommended that you take a prescription medicine daily to prevent HIV infection. This is called pre-exposure prophylaxis (PrEP). You are considered at risk if:  You are a man who has sex with other men (MSM).  You are a heterosexual man who is sexually active with multiple partners.  You take drugs by injection.  You are sexually active with a partner who has HIV.  Talk with your health care provider about whether you are at high risk of being infected with HIV. If you choose to begin PrEP, you should first be tested for HIV. You should then be tested every 3 months for as long as you are  taking PrEP.  Use sunscreen. Apply sunscreen liberally and repeatedly throughout the day. You should seek shade when your shadow is shorter than you. Protect yourself by wearing long sleeves, pants, a wide-brimmed hat, and sunglasses year round whenever you are outdoors.  Tell your health care provider of new moles or changes in moles, especially if there is a change in shape or color. Also, tell your health care provider if a mole is larger than the size of a pencil eraser.  A one-time screening for abdominal aortic aneurysm (AAA) and surgical repair of large AAAs by ultrasound is recommended for men aged 45-75 years who are current or former smokers.  Stay current with your vaccines (immunizations). This information is not intended to replace advice given to you by your health care provider. Make sure you discuss any questions you have with your health care provider. Document Released: 10/23/2007 Document Revised: 05/17/2014 Document Reviewed: 01/28/2015 Elsevier Interactive Patient Education  2017 [employer].

## 2016-04-19 NOTE — Assessment & Plan Note (Signed)
Better on Hytrin

## 2016-04-19 NOTE — Assessment & Plan Note (Signed)
Viagra prn 

## 2016-04-19 NOTE — Assessment & Plan Note (Signed)
Labs

## 2016-04-19 NOTE — Progress Notes (Signed)
Pre visit review using our clinic review tool, if applicable. No additional management support is needed unless otherwise documented below in the visit note. 

## 2016-04-19 NOTE — Progress Notes (Signed)
Subjective:  Patient ID: Donald Phillips, male    DOB: 07/20/1945  Age: 70 y.o. MRN: [id number]  CC: No chief complaint on file.   HPI Donald Phillips presents for a well exam  Outpatient Medications Prior to Visit  Medication Sig Dispense Refill  . atorvastatin (LIPITOR) 20 MG tablet Take 1 tablet (20 mg total) by mouth daily. 90 tablet 3  . Biotin 5000 MCG TABS Take 1 tablet by mouth daily.    Marland Kitchen buPROPion (WELLBUTRIN XL) 300 MG 24 hr tablet Take 1 tablet (300 mg total) by mouth daily. 90 tablet 3  . Cholecalciferol (VITAMIN D3) 1000 UNITS tablet Take 1,000 Units by mouth daily.      . Cyanocobalamin (VITAMIN B-12) 1000 MCG SUBL Place 1 tablet (1,000 mcg total) under the tongue daily. 100 tablet 3  . fenofibrate (TRICOR) 145 MG tablet Take 1 tablet (145 mg total) by mouth daily. 90 tablet 3  . levothyroxine (SYNTHROID, LEVOTHROID) 175 MCG tablet TAKE ONE TABLET BY MOUTH ONCE DAILY BEFORE  BREAKFAST 90 tablet 3  . tadalafil (CIALIS) 20 MG tablet Take 1 tablet (20 mg total) by mouth daily as needed for erectile dysfunction. 30 tablet 5  . terazosin (HYTRIN) 2 MG capsule Take 1 capsule (2 mg total) by mouth at bedtime. 90 capsule 3   No facility-administered medications prior to visit.     ROS Review of Systems  Constitutional: Negative for appetite change, fatigue and unexpected weight change.  HENT: Negative for congestion, ear pain, nosebleeds, sneezing, sore throat and trouble swallowing.   Eyes: Negative for itching and visual disturbance.  Respiratory: Negative for cough.   Cardiovascular: Negative for chest pain, palpitations and leg swelling.  Gastrointestinal: Negative for abdominal distention, blood in stool, diarrhea and nausea.  Genitourinary: Negative for decreased urine volume, frequency, hematuria and scrotal swelling.  Musculoskeletal: Negative for back pain, gait problem, joint swelling and neck pain.  Skin: Negative for rash.  Neurological: Negative for  dizziness, tremors, speech difficulty and weakness.  Psychiatric/Behavioral: Negative for agitation, dysphoric mood and sleep disturbance. The patient is not nervous/anxious.     Objective:  BP (!) 160/80   Pulse 67   Temp 97.7 F (36.5 C)   Wt 213 lb (96.6 kg)   SpO2 (!) 67%   BMI 31.45 kg/m   BP Readings from Last 3 Encounters:  04/19/16 (!) 160/80  03/18/16 132/72  02/19/16 128/70    Wt Readings from Last 3 Encounters:  04/19/16 213 lb (96.6 kg)  03/18/16 216 lb (98 kg)  02/19/16 215 lb (97.5 kg)    Physical Exam  Constitutional: He is oriented to person, place, and time. He appears well-developed and well-nourished. No distress.  HENT:  Head: Normocephalic and atraumatic.  Right Ear: External ear normal.  Left Ear: External ear normal.  Nose: Nose normal.  Mouth/Throat: Oropharynx is clear and moist. No oropharyngeal exudate.  Eyes: Conjunctivae and EOM are normal. Pupils are equal, round, and reactive to light. Right eye exhibits no discharge. Left eye exhibits no discharge. No scleral icterus.  Neck: Normal range of motion. Neck supple. No JVD present. No tracheal deviation present. No thyromegaly present.  Cardiovascular: Normal rate, regular rhythm, normal heart sounds and intact distal pulses.  Exam reveals no gallop and no friction rub.   No murmur heard. Pulmonary/Chest: Effort normal and breath sounds normal. No stridor. No respiratory distress. He has no wheezes. He has no rales. He exhibits no tenderness.  Abdominal: Soft.  Bowel sounds are normal. He exhibits no distension and no mass. There is no tenderness. There is no rebound and no guarding.  Genitourinary: Rectum normal and penis normal. Rectal exam shows guaiac negative stool. No penile tenderness.  Musculoskeletal: Normal range of motion. He exhibits no edema or tenderness.  Lymphadenopathy:    He has no cervical adenopathy.  Neurological: He is alert and oriented to person, place, and time. He has  normal reflexes. No cranial nerve deficit. He exhibits normal muscle tone. Coordination normal.  Skin: Skin is warm and dry. No rash noted. He is not diaphoretic. No erythema. No pallor.  Psychiatric: He has a normal mood and affect. His behavior is normal. Judgment and thought content normal.  prostate 1+  Lab Results  Component Value Date   WBC 7.8 12/18/2014   HGB 15.6 12/18/2014   HCT 45.9 12/18/2014   PLT 265.0 12/18/2014   GLUCOSE 109 (H) 01/16/2016   CHOL 170 01/16/2016   TRIG 164.0 (H) 01/16/2016   HDL 42.90 01/16/2016   LDLDIRECT 117.0 09/04/2015   LDLCALC 94 01/16/2016   ALT 26 01/16/2016   AST 21 01/16/2016   NA 142 01/16/2016   K 5.1 01/16/2016   CL 108 01/16/2016   CREATININE 1.38 01/16/2016   BUN 17 01/16/2016   CO2 29 01/16/2016   TSH 0.05 (L) 08/12/2014   PSA 2.75 12/18/2014   INR 1.00 10/04/2011   HGBA1C 5.9 09/04/2015    Dg Chest 2 View  Result Date: 02/19/2013 CLINICAL DATA:  Bronchitis. Three month followup. EXAM: CHEST  2 VIEW COMPARISON:  10/04/2011. FINDINGS: The heart size and mediastinal contours are within normal limits. Both lungs are clear. The bony thorax is demineralized but intact. IMPRESSION: No active cardiopulmonary disease. Electronically Signed   By: Lajean Manes M.D.   On: 02/19/2013 16:31    Assessment & Plan:   There are no diagnoses linked to this encounter. I am having Mr. Merchant start on amLODipine. I am also having him maintain his cholecalciferol, Vitamin B-12, Biotin, tadalafil, atorvastatin, fenofibrate, terazosin, buPROPion, levothyroxine, and Turmeric Curcumin.  Meds ordered this encounter  Medications  . Turmeric Curcumin 500 MG CAPS    Sig: Take by mouth 1 day or 1 dose.  Marland Kitchen amLODipine (NORVASC) 5 MG tablet    Sig: Take 1 tablet (5 mg total) by mouth daily.    Dispense:  90 tablet    Refill:  3     Follow-up: No Follow-up on file.  Walker Kehr, MD

## 2016-04-19 NOTE — Assessment & Plan Note (Signed)
Worse Will add Norvasc Labs

## 2016-04-19 NOTE — Assessment & Plan Note (Signed)
On Tricor 

## 2016-04-19 NOTE — Assessment & Plan Note (Signed)
Labs  Added Amlodipine for better BP control NAS diet

## 2016-04-20 ENCOUNTER — Other Ambulatory Visit: Payer: Self-pay | Admitting: Internal Medicine

## 2016-04-20 DIAGNOSIS — E034 Atrophy of thyroid (acquired): Secondary | ICD-10-CM

## 2016-04-20 MED ORDER — LEVOTHYROXINE SODIUM 150 MCG PO TABS: 150.0000 ug | ORAL_TABLET | Freq: Every day | ORAL | 3 refills | Status: DC

## 2016-05-10 DIAGNOSIS — H109 Unspecified conjunctivitis: Secondary | ICD-10-CM | POA: Diagnosis not present

## 2016-05-10 DIAGNOSIS — J22 Unspecified acute lower respiratory infection: Secondary | ICD-10-CM | POA: Diagnosis not present

## 2016-06-07 DIAGNOSIS — L579 Skin changes due to chronic exposure to nonionizing radiation, unspecified: Secondary | ICD-10-CM | POA: Diagnosis not present

## 2016-06-07 DIAGNOSIS — L570 Actinic keratosis: Secondary | ICD-10-CM | POA: Diagnosis not present

## 2016-06-07 DIAGNOSIS — Z85828 Personal history of other malignant neoplasm of skin: Secondary | ICD-10-CM | POA: Diagnosis not present

## 2016-06-07 DIAGNOSIS — Z85820 Personal history of malignant melanoma of skin: Secondary | ICD-10-CM | POA: Diagnosis not present

## 2016-06-07 DIAGNOSIS — L821 Other seborrheic keratosis: Secondary | ICD-10-CM | POA: Diagnosis not present

## 2016-06-07 DIAGNOSIS — L814 Other melanin hyperpigmentation: Secondary | ICD-10-CM | POA: Diagnosis not present

## 2016-06-21 ENCOUNTER — Encounter: Payer: Self-pay | Admitting: Internal Medicine

## 2016-07-19 ENCOUNTER — Encounter: Payer: Self-pay | Admitting: Internal Medicine

## 2016-07-19 ENCOUNTER — Ambulatory Visit (INDEPENDENT_AMBULATORY_CARE_PROVIDER_SITE_OTHER): Payer: Medicare Other | Admitting: Internal Medicine

## 2016-07-19 ENCOUNTER — Other Ambulatory Visit (INDEPENDENT_AMBULATORY_CARE_PROVIDER_SITE_OTHER): Payer: Medicare Other

## 2016-07-19 DIAGNOSIS — N320 Bladder-neck obstruction: Secondary | ICD-10-CM | POA: Diagnosis not present

## 2016-07-19 DIAGNOSIS — N183 Chronic kidney disease, stage 3 unspecified: Secondary | ICD-10-CM

## 2016-07-19 DIAGNOSIS — I10 Essential (primary) hypertension: Secondary | ICD-10-CM

## 2016-07-19 DIAGNOSIS — E034 Atrophy of thyroid (acquired): Secondary | ICD-10-CM | POA: Diagnosis not present

## 2016-07-19 DIAGNOSIS — E559 Vitamin D deficiency, unspecified: Secondary | ICD-10-CM

## 2016-07-19 LAB — BASIC METABOLIC PANEL
BUN: 15 mg/dL (ref 6–23)
CHLORIDE: 109 meq/L (ref 96–112)
CO2: 29 meq/L (ref 19–32)
Calcium: 9.5 mg/dL (ref 8.4–10.5)
Creatinine, Ser: 1.36 mg/dL (ref 0.40–1.50)
GFR: 55.14 mL/min — ABNORMAL LOW (ref >60.00)
Glucose, Bld: 108 mg/dL — ABNORMAL HIGH (ref 70–99)
POTASSIUM: 4.4 meq/L (ref 3.5–5.1)
Sodium: 143 mEq/L (ref 135–145)

## 2016-07-19 LAB — LIPID PANEL
Cholesterol: 159 mg/dL (ref 0–200)
HDL: 43.5 mg/dL (ref >39.00)
LDL Cholesterol: 91 mg/dL (ref 0–99)
NONHDL: 115.4
Total CHOL/HDL Ratio: 4
Triglycerides: 124 mg/dL (ref 0.0–149.0)
VLDL: 24.8 mg/dL (ref 0.0–40.0)

## 2016-07-19 MED ORDER — TERAZOSIN HCL 5 MG PO CAPS: 5.0000 mg | ORAL_CAPSULE | Freq: Every day | ORAL | 3 refills | Status: DC

## 2016-07-19 NOTE — Assessment & Plan Note (Signed)
Hytrin helped

## 2016-07-19 NOTE — Assessment & Plan Note (Signed)
On Vit D 

## 2016-07-19 NOTE — Assessment & Plan Note (Signed)
Labs

## 2016-07-19 NOTE — Progress Notes (Signed)
Pre-visit discussion using our clinic review tool. No additional management support is needed unless otherwise documented below in the visit note.  

## 2016-07-19 NOTE — Progress Notes (Signed)
Subjective:  Patient ID: Donald Fillers., male    DOB: [birth year]  Age: 71 y.o. MRN: [id number]  CC: Follow-up (HTN, Hypothyroidism)   HPI Donald Fillers. presents for HTN, dyslipidemia, depression f/u SBP140-150 at home  Outpatient Medications Prior to Visit  Medication Sig Dispense Refill  . amLODipine (NORVASC) 5 MG tablet Take 1 tablet (5 mg total) by mouth daily. 90 tablet 3  . atorvastatin (LIPITOR) 20 MG tablet Take 1 tablet (20 mg total) by mouth daily. 90 tablet 3  . Biotin 5000 MCG TABS Take 1 tablet by mouth daily.    Marland Kitchen buPROPion (WELLBUTRIN XL) 300 MG 24 hr tablet Take 1 tablet (300 mg total) by mouth daily. 90 tablet 3  . Cholecalciferol (VITAMIN D3) 1000 UNITS tablet Take 1,000 Units by mouth daily.      . Cyanocobalamin (VITAMIN B-12) 1000 MCG SUBL Place 1 tablet (1,000 mcg total) under the tongue daily. 100 tablet 3  . fenofibrate (TRICOR) 145 MG tablet Take 1 tablet (145 mg total) by mouth daily. 90 tablet 3  . levothyroxine (SYNTHROID, LEVOTHROID) 150 MCG tablet Take 1 tablet (150 mcg total) by mouth daily. 30 tablet 3  . sildenafil (VIAGRA) 100 MG tablet Take 1 tablet (100 mg total) by mouth as needed for erectile dysfunction. 30 tablet 5  . terazosin (HYTRIN) 2 MG capsule Take 1 capsule (2 mg total) by mouth at bedtime. 90 capsule 3  . Turmeric Curcumin 500 MG CAPS Take by mouth 1 day or 1 dose.     No facility-administered medications prior to visit.     ROS Review of Systems  Constitutional: Negative for appetite change, fatigue and unexpected weight change.  HENT: Negative for congestion, nosebleeds, sneezing, sore throat and trouble swallowing.   Eyes: Negative for itching and visual disturbance.  Respiratory: Negative for cough.   Cardiovascular: Negative for chest pain, palpitations and leg swelling.  Gastrointestinal: Negative for abdominal distention, blood in stool, diarrhea and nausea.  Genitourinary: Negative for frequency and hematuria.    Musculoskeletal: Negative for back pain, gait problem, joint swelling and neck pain.  Skin: Negative for rash.  Neurological: Negative for dizziness, tremors, speech difficulty and weakness.  Psychiatric/Behavioral: Negative for agitation, dysphoric mood and sleep disturbance. The patient is not nervous/anxious.     Objective:  BP 136/82   Pulse 72   Temp 98.5 F (36.9 C) (Oral)   Resp 18   Ht 5\' 9"  (1.753 m)   Wt 215 lb (97.5 kg)   SpO2 92%   BMI 31.75 kg/m   BP Readings from Last 3 Encounters:  07/19/16 136/82  04/19/16 (!) 160/80  03/18/16 132/72    Wt Readings from Last 3 Encounters:  07/19/16 215 lb (97.5 kg)  04/19/16 213 lb (96.6 kg)  03/18/16 216 lb (98 kg)    Physical Exam  Constitutional: He is oriented to person, place, and time. He appears well-developed. No distress.  NAD  HENT:  Mouth/Throat: Oropharynx is clear and moist.  Eyes: Conjunctivae are normal. Pupils are equal, round, and reactive to light.  Neck: Normal range of motion. No JVD present. No thyromegaly present.  Cardiovascular: Normal rate, regular rhythm, normal heart sounds and intact distal pulses.  Exam reveals no gallop and no friction rub.   No murmur heard. Pulmonary/Chest: Effort normal and breath sounds normal. No respiratory distress. He has no wheezes. He has no rales. He exhibits no tenderness.  Abdominal: Soft. Bowel sounds are normal. He exhibits no  distension and no mass. There is no tenderness. There is no rebound and no guarding.  Musculoskeletal: Normal range of motion. He exhibits no edema or tenderness.  Lymphadenopathy:    He has no cervical adenopathy.  Neurological: He is alert and oriented to person, place, and time. He has normal reflexes. No cranial nerve deficit. He exhibits normal muscle tone. He displays a negative Romberg sign. Coordination and gait normal.  Skin: Skin is warm and dry. No rash noted.  Psychiatric: He has a normal mood and affect. His behavior is  normal. Judgment and thought content normal.    Lab Results  Component Value Date   WBC 7.5 04/19/2016   HGB 15.1 04/19/2016   HCT 43.9 04/19/2016   PLT 264.0 04/19/2016   GLUCOSE 112 (H) 04/19/2016   CHOL 222 (H) 04/19/2016   TRIG 185.0 (H) 04/19/2016   HDL 47.00 04/19/2016   LDLDIRECT 117.0 09/04/2015   LDLCALC 138 (H) 04/19/2016   ALT 22 04/19/2016   AST 20 04/19/2016   NA 141 04/19/2016   K 5.2 (H) 04/19/2016   CL 107 04/19/2016   CREATININE 1.42 04/19/2016   BUN 19 04/19/2016   CO2 28 04/19/2016   TSH 0.01 (L) 04/19/2016   PSA 2.79 04/19/2016   INR 1.00 10/04/2011   HGBA1C 5.9 09/04/2015    Dg Chest 2 View  Result Date: 02/19/2013 CLINICAL DATA:  Bronchitis. Three month followup. EXAM: CHEST  2 VIEW COMPARISON:  10/04/2011. FINDINGS: The heart size and mediastinal contours are within normal limits. Both lungs are clear. The bony thorax is demineralized but intact. IMPRESSION: No active cardiopulmonary disease. Electronically Signed   By: Lajean Manes M.D.   On: 02/19/2013 16:31    Assessment & Plan:   There are no diagnoses linked to this encounter. I am having Mr. Dibble maintain his cholecalciferol, Vitamin B-12, Biotin, atorvastatin, fenofibrate, terazosin, buPROPion, Turmeric Curcumin, amLODipine, sildenafil, and levothyroxine.  No orders of the defined types were placed in this encounter.    Follow-up: No Follow-up on file.  Walker Kehr, MD

## 2016-07-19 NOTE — Assessment & Plan Note (Signed)
Increase Hytrin to 4 mg/d if tol

## 2016-07-19 NOTE — Assessment & Plan Note (Signed)
On Levothroid 

## 2016-07-20 ENCOUNTER — Other Ambulatory Visit: Payer: Self-pay | Admitting: Internal Medicine

## 2016-07-20 LAB — T4, FREE: Free T4: 1.78 ng/dL — ABNORMAL HIGH (ref 0.60–1.60)

## 2016-07-20 LAB — TSH

## 2016-07-20 MED ORDER — LEVOTHYROXINE SODIUM 137 MCG PO CAPS: 1.0000 | ORAL_CAPSULE | Freq: Every day | ORAL | 11 refills | Status: DC

## 2016-07-21 ENCOUNTER — Telehealth: Payer: Self-pay | Admitting: *Deleted

## 2016-07-21 MED ORDER — LEVOTHYROXINE SODIUM 137 MCG PO TABS: 137.0000 ug | ORAL_TABLET | Freq: Every day | ORAL | 11 refills | Status: DC

## 2016-07-21 NOTE — Telephone Encounter (Signed)
Rec'd fax stating script sent for Levothyroxine 137 mcg capsule is not covered. Pt was previously on the tablets. Requesting rx for tablets. Updated med list & sent tablets...Donald Phillips

## 2016-09-13 ENCOUNTER — Other Ambulatory Visit: Payer: Self-pay | Admitting: Internal Medicine

## 2016-11-18 ENCOUNTER — Other Ambulatory Visit: Payer: Medicare Other

## 2016-11-18 ENCOUNTER — Ambulatory Visit (INDEPENDENT_AMBULATORY_CARE_PROVIDER_SITE_OTHER): Payer: Medicare Other | Admitting: Internal Medicine

## 2016-11-18 ENCOUNTER — Encounter: Payer: Self-pay | Admitting: Internal Medicine

## 2016-11-18 DIAGNOSIS — E538 Deficiency of other specified B group vitamins: Secondary | ICD-10-CM

## 2016-11-18 DIAGNOSIS — N183 Chronic kidney disease, stage 3 unspecified: Secondary | ICD-10-CM

## 2016-11-18 DIAGNOSIS — N320 Bladder-neck obstruction: Secondary | ICD-10-CM | POA: Diagnosis not present

## 2016-11-18 DIAGNOSIS — I10 Essential (primary) hypertension: Secondary | ICD-10-CM | POA: Diagnosis not present

## 2016-11-18 DIAGNOSIS — E034 Atrophy of thyroid (acquired): Secondary | ICD-10-CM

## 2016-11-18 MED ORDER — ZOSTER VAC RECOMB ADJUVANTED 50 MCG/0.5ML IM SUSR: 0.5000 mL | Freq: Once | INTRAMUSCULAR | 1 refills | Status: AC

## 2016-11-18 NOTE — Assessment & Plan Note (Signed)
TSH, FT4 

## 2016-11-18 NOTE — Assessment & Plan Note (Signed)
Hytrin, Norvasc BP Readings from Last 3 Encounters:  11/18/16 126/72  07/19/16 136/82  04/19/16 (!) 160/80

## 2016-11-18 NOTE — Assessment & Plan Note (Signed)
BMET 

## 2016-11-18 NOTE — Assessment & Plan Note (Signed)
Hytrin 

## 2016-11-18 NOTE — Assessment & Plan Note (Signed)
On B12 

## 2016-11-18 NOTE — Progress Notes (Signed)
Subjective:  Patient ID: Donald Phillips., male    DOB: June 03, 1945  Age: 71 y.o. MRN: [id number]  CC: No chief complaint on file.   HPI Donald Phillips. presents for HTN, hypothyroidism, B12 def f/u  Outpatient Medications Prior to Visit  Medication Sig Dispense Refill  . amLODipine (NORVASC) 5 MG tablet Take 1 tablet (5 mg total) by mouth daily. 90 tablet 3  . atorvastatin (LIPITOR) 20 MG tablet Take 1 tablet (20 mg total) by mouth daily. 90 tablet 3  . Biotin 5000 MCG TABS Take 1 tablet by mouth daily.    Marland Kitchen buPROPion (WELLBUTRIN XL) 300 MG 24 hr tablet Take 1 tablet (300 mg total) by mouth daily. 90 tablet 3  . Cholecalciferol (VITAMIN D3) 1000 UNITS tablet Take 1,000 Units by mouth daily.      . Cyanocobalamin (VITAMIN B-12) 1000 MCG SUBL Place 1 tablet (1,000 mcg total) under the tongue daily. 100 tablet 3  . fenofibrate (TRICOR) 145 MG tablet TAKE ONE TABLET BY MOUTH ONCE DAILY 90 tablet 1  . levothyroxine (SYNTHROID, LEVOTHROID) 137 MCG tablet Take 1 tablet (137 mcg total) by mouth daily before breakfast. 30 tablet 11  . sildenafil (VIAGRA) 100 MG tablet Take 1 tablet (100 mg total) by mouth as needed for erectile dysfunction. 30 tablet 5  . terazosin (HYTRIN) 5 MG capsule Take 1 capsule (5 mg total) by mouth at bedtime. 90 capsule 3  . Turmeric Curcumin 500 MG CAPS Take by mouth 1 day or 1 dose.     No facility-administered medications prior to visit.     ROS Review of Systems  Constitutional: Negative for appetite change, fatigue and unexpected weight change.  HENT: Negative for congestion, nosebleeds, sneezing, sore throat and trouble swallowing.   Eyes: Negative for itching and visual disturbance.  Respiratory: Negative for cough.   Cardiovascular: Negative for chest pain, palpitations and leg swelling.  Gastrointestinal: Negative for abdominal distention, blood in stool, diarrhea and nausea.  Genitourinary: Negative for frequency and hematuria.  Musculoskeletal:  Negative for back pain, gait problem, joint swelling and neck pain.  Skin: Negative for rash.  Neurological: Negative for dizziness, tremors, speech difficulty and weakness.  Psychiatric/Behavioral: Negative for agitation, dysphoric mood, sleep disturbance and suicidal ideas. The patient is not nervous/anxious.     Objective:  BP 126/72 (BP Location: Left Arm, Patient Position: Sitting, Cuff Size: Large)   Pulse 71   Temp 98.7 F (37.1 C) (Oral)   Ht 5\' 9"  (1.753 m)   Wt 212 lb (96.2 kg)   SpO2 98%   BMI 31.31 kg/m   BP Readings from Last 3 Encounters:  11/18/16 126/72  07/19/16 136/82  04/19/16 (!) 160/80    Wt Readings from Last 3 Encounters:  11/18/16 212 lb (96.2 kg)  07/19/16 215 lb (97.5 kg)  04/19/16 213 lb (96.6 kg)    Physical Exam  Constitutional: He is oriented to person, place, and time. He appears well-developed. No distress.  NAD  HENT:  Mouth/Throat: Oropharynx is clear and moist.  Eyes: Pupils are equal, round, and reactive to light. Conjunctivae are normal.  Neck: Normal range of motion. No JVD present. No thyromegaly present.  Cardiovascular: Normal rate, regular rhythm, normal heart sounds and intact distal pulses.  Exam reveals no gallop and no friction rub.   No murmur heard. Pulmonary/Chest: Effort normal and breath sounds normal. No respiratory distress. He has no wheezes. He has no rales. He exhibits no tenderness.  Abdominal: Soft.  Bowel sounds are normal. He exhibits no distension and no mass. There is no tenderness. There is no rebound and no guarding.  Musculoskeletal: Normal range of motion. He exhibits no edema or tenderness.  Lymphadenopathy:    He has no cervical adenopathy.  Neurological: He is alert and oriented to person, place, and time. He has normal reflexes. No cranial nerve deficit. He exhibits normal muscle tone. He displays a negative Romberg sign. Coordination and gait normal.  Skin: Skin is warm and dry. No rash noted.    Psychiatric: He has a normal mood and affect. His behavior is normal. Judgment and thought content normal.    Lab Results  Component Value Date   WBC 7.5 04/19/2016   HGB 15.1 04/19/2016   HCT 43.9 04/19/2016   PLT 264.0 04/19/2016   GLUCOSE 108 (H) 07/19/2016   CHOL 159 07/19/2016   TRIG 124.0 07/19/2016   HDL 43.50 07/19/2016   LDLDIRECT 117.0 09/04/2015   LDLCALC 91 07/19/2016   ALT 22 04/19/2016   AST 20 04/19/2016   NA 143 07/19/2016   K 4.4 07/19/2016   CL 109 07/19/2016   CREATININE 1.36 07/19/2016   BUN 15 07/19/2016   CO2 29 07/19/2016   TSH <0.01 Repeated and verified X2. (L) 07/19/2016   PSA 2.79 04/19/2016   INR 1.00 10/04/2011   HGBA1C 5.9 09/04/2015    Dg Chest 2 View  Result Date: 02/19/2013 CLINICAL DATA:  Bronchitis. Three month followup. EXAM: CHEST  2 VIEW COMPARISON:  10/04/2011. FINDINGS: The heart size and mediastinal contours are within normal limits. Both lungs are clear. The bony thorax is demineralized but intact. IMPRESSION: No active cardiopulmonary disease. Electronically Signed   By: Lajean Manes M.D.   On: 02/19/2013 16:31    Assessment & Plan:   There are no diagnoses linked to this encounter. I am having Mr. Gittleman maintain his cholecalciferol, Vitamin B-12, Biotin, atorvastatin, buPROPion, Turmeric Curcumin, amLODipine, sildenafil, terazosin, levothyroxine, and fenofibrate.  No orders of the defined types were placed in this encounter.    Follow-up: No Follow-up on file.  Walker Kehr, MD

## 2016-11-24 ENCOUNTER — Other Ambulatory Visit: Payer: Self-pay | Admitting: Internal Medicine

## 2016-11-28 DIAGNOSIS — J069 Acute upper respiratory infection, unspecified: Secondary | ICD-10-CM | POA: Diagnosis not present

## 2016-12-06 DIAGNOSIS — Z85820 Personal history of malignant melanoma of skin: Secondary | ICD-10-CM | POA: Diagnosis not present

## 2016-12-06 DIAGNOSIS — L579 Skin changes due to chronic exposure to nonionizing radiation, unspecified: Secondary | ICD-10-CM | POA: Diagnosis not present

## 2016-12-06 DIAGNOSIS — Z85828 Personal history of other malignant neoplasm of skin: Secondary | ICD-10-CM | POA: Diagnosis not present

## 2016-12-06 DIAGNOSIS — L821 Other seborrheic keratosis: Secondary | ICD-10-CM | POA: Diagnosis not present

## 2016-12-06 DIAGNOSIS — L570 Actinic keratosis: Secondary | ICD-10-CM | POA: Diagnosis not present

## 2016-12-06 DIAGNOSIS — L814 Other melanin hyperpigmentation: Secondary | ICD-10-CM | POA: Diagnosis not present

## 2017-02-22 ENCOUNTER — Other Ambulatory Visit: Payer: Self-pay | Admitting: Internal Medicine

## 2017-03-03 DIAGNOSIS — H47323 Drusen of optic disc, bilateral: Secondary | ICD-10-CM | POA: Diagnosis not present

## 2017-03-03 DIAGNOSIS — H5213 Myopia, bilateral: Secondary | ICD-10-CM | POA: Diagnosis not present

## 2017-04-02 ENCOUNTER — Other Ambulatory Visit: Payer: Self-pay | Admitting: Internal Medicine

## 2017-04-19 ENCOUNTER — Other Ambulatory Visit: Payer: Self-pay | Admitting: Internal Medicine

## 2017-05-24 ENCOUNTER — Other Ambulatory Visit (INDEPENDENT_AMBULATORY_CARE_PROVIDER_SITE_OTHER): Payer: Medicare Other

## 2017-05-24 ENCOUNTER — Encounter: Payer: Self-pay | Admitting: Internal Medicine

## 2017-05-24 ENCOUNTER — Ambulatory Visit (INDEPENDENT_AMBULATORY_CARE_PROVIDER_SITE_OTHER): Payer: Medicare Other | Admitting: Internal Medicine

## 2017-05-24 VITALS — BP 126/72 | HR 70 | Temp 98.5°F | Ht 69.0 in | Wt 214.0 lb

## 2017-05-24 DIAGNOSIS — E782 Mixed hyperlipidemia: Secondary | ICD-10-CM | POA: Diagnosis not present

## 2017-05-24 DIAGNOSIS — I10 Essential (primary) hypertension: Secondary | ICD-10-CM | POA: Diagnosis not present

## 2017-05-24 DIAGNOSIS — E034 Atrophy of thyroid (acquired): Secondary | ICD-10-CM

## 2017-05-24 DIAGNOSIS — Z23 Encounter for immunization: Secondary | ICD-10-CM

## 2017-05-24 LAB — BASIC METABOLIC PANEL
BUN: 19 mg/dL (ref 6–23)
CHLORIDE: 105 meq/L (ref 96–112)
CO2: 26 meq/L (ref 19–32)
CREATININE: 1.34 mg/dL (ref 0.40–1.50)
Calcium: 9.3 mg/dL (ref 8.4–10.5)
GFR: 55.96 mL/min — ABNORMAL LOW (ref >60.00)
Glucose, Bld: 111 mg/dL — ABNORMAL HIGH (ref 70–99)
Potassium: 4.7 mEq/L (ref 3.5–5.1)
SODIUM: 139 meq/L (ref 135–145)

## 2017-05-24 LAB — T4, FREE: FREE T4: 1.68 ng/dL — AB (ref 0.60–1.60)

## 2017-05-24 LAB — TSH

## 2017-05-24 MED ORDER — PITAVASTATIN CALCIUM 1 MG PO TABS: 1.0000 mg | ORAL_TABLET | Freq: Every day | ORAL | 11 refills | Status: DC

## 2017-05-24 NOTE — Assessment & Plan Note (Signed)
Dose of Levothroid was reduced Labs

## 2017-05-24 NOTE — Assessment & Plan Note (Signed)
Hytrin, Norvasc 

## 2017-05-24 NOTE — Assessment & Plan Note (Signed)
Lipitor - can't tolerate a low dose only due to arthralgia - d/c; Tricor Will try Livalo

## 2017-05-24 NOTE — Addendum Note (Signed)
Addended by: Karren Cobble on: 05/24/2017 08:40 AM   Modules accepted: Orders

## 2017-05-24 NOTE — Progress Notes (Signed)
Subjective:  Patient ID: Donald Fillers., male    DOB: [birth year]  Age: 72 y.o. MRN: [id number]  CC: No chief complaint on file.   HPI Donald Fillers. presents for dyslipidemia - he had to stop Lipitor due to pains F/u depression, hypothyroidism  Outpatient Medications Prior to Visit  Medication Sig Dispense Refill  . Biotin 5000 MCG TABS Take 1 tablet by mouth daily.    Marland Kitchen buPROPion (WELLBUTRIN XL) 300 MG 24 hr tablet TAKE ONE TABLET BY MOUTH ONCE DAILY 90 tablet 3  . Cholecalciferol (VITAMIN D3) 1000 UNITS tablet Take 1,000 Units by mouth daily.      . Cyanocobalamin (VITAMIN B-12) 1000 MCG SUBL Place 1 tablet (1,000 mcg total) under the tongue daily. 100 tablet 3  . fenofibrate (TRICOR) 145 MG tablet TAKE 1 TABLET BY MOUTH ONCE DAILY 90 tablet 3  . levothyroxine (SYNTHROID, LEVOTHROID) 137 MCG tablet Take 1 tablet (137 mcg total) by mouth daily before breakfast. 30 tablet 11  . levothyroxine (SYNTHROID, LEVOTHROID) 175 MCG tablet TAKE ONE TABLET BY MOUTH ONCE DAILY BEFORE BREAKFAST 90 tablet 0  . terazosin (HYTRIN) 5 MG capsule TAKE 1 CAPSULE BY MOUTH AT BEDTIME 90 capsule 3  . Turmeric Curcumin 500 MG CAPS Take by mouth 1 day or 1 dose.    Marland Kitchen amLODipine (NORVASC) 5 MG tablet Take 1 tablet (5 mg total) by mouth daily. 90 tablet 3  . atorvastatin (LIPITOR) 20 MG tablet TAKE ONE TABLET BY MOUTH ONCE DAILY (Patient not taking: Reported on 05/24/2017) 90 tablet 3  . sildenafil (VIAGRA) 100 MG tablet Take 1 tablet (100 mg total) by mouth as needed for erectile dysfunction. 30 tablet 5   No facility-administered medications prior to visit.     ROS Review of Systems  Constitutional: Negative for appetite change, fatigue and unexpected weight change.  HENT: Negative for congestion, nosebleeds, sneezing, sore throat and trouble swallowing.   Eyes: Negative for itching and visual disturbance.  Respiratory: Negative for cough.   Cardiovascular: Negative for chest pain, palpitations and  leg swelling.  Gastrointestinal: Negative for abdominal distention, blood in stool, diarrhea and nausea.  Genitourinary: Negative for frequency and hematuria.  Musculoskeletal: Positive for arthralgias. Negative for back pain, gait problem, joint swelling and neck pain.  Skin: Negative for rash.  Neurological: Negative for dizziness, tremors, speech difficulty and weakness.  Psychiatric/Behavioral: Negative for agitation, dysphoric mood and sleep disturbance. The patient is not nervous/anxious.     Objective:  BP 126/72 (BP Location: Left Arm, Patient Position: Sitting, Cuff Size: Large)   Pulse 70   Temp 98.5 F (36.9 C) (Oral)   Ht 5\' 9"  (1.753 m)   Wt 214 lb (97.1 kg)   SpO2 99%   BMI 31.60 kg/m   BP Readings from Last 3 Encounters:  05/24/17 126/72  11/18/16 126/72  07/19/16 136/82    Wt Readings from Last 3 Encounters:  05/24/17 214 lb (97.1 kg)  11/18/16 212 lb (96.2 kg)  07/19/16 215 lb (97.5 kg)    Physical Exam  Constitutional: He is oriented to person, place, and time. He appears well-developed. No distress.  NAD  HENT:  Mouth/Throat: Oropharynx is clear and moist.  Eyes: Conjunctivae are normal. Pupils are equal, round, and reactive to light.  Neck: Normal range of motion. No JVD present. No thyromegaly present.  Cardiovascular: Normal rate, regular rhythm, normal heart sounds and intact distal pulses. Exam reveals no gallop and no friction rub.  No murmur heard.  Pulmonary/Chest: Effort normal and breath sounds normal. No respiratory distress. He has no wheezes. He has no rales. He exhibits no tenderness.  Abdominal: Soft. Bowel sounds are normal. He exhibits no distension and no mass. There is no tenderness. There is no rebound and no guarding.  Musculoskeletal: Normal range of motion. He exhibits no edema or tenderness.  Lymphadenopathy:    He has no cervical adenopathy.  Neurological: He is alert and oriented to person, place, and time. He has normal  reflexes. No cranial nerve deficit. He exhibits normal muscle tone. He displays a negative Romberg sign. Coordination and gait normal.  Skin: Skin is warm and dry. No rash noted.  Psychiatric: He has a normal mood and affect. His behavior is normal. Judgment and thought content normal.    Lab Results  Component Value Date   WBC 7.5 04/19/2016   HGB 15.1 04/19/2016   HCT 43.9 04/19/2016   PLT 264.0 04/19/2016   GLUCOSE 108 (H) 07/19/2016   CHOL 159 07/19/2016   TRIG 124.0 07/19/2016   HDL 43.50 07/19/2016   LDLDIRECT 117.0 09/04/2015   LDLCALC 91 07/19/2016   ALT 22 04/19/2016   AST 20 04/19/2016   NA 143 07/19/2016   K 4.4 07/19/2016   CL 109 07/19/2016   CREATININE 1.36 07/19/2016   BUN 15 07/19/2016   CO2 29 07/19/2016   TSH <0.01 Repeated and verified X2. (L) 07/19/2016   PSA 2.79 04/19/2016   INR 1.00 10/04/2011   HGBA1C 5.9 09/04/2015    Dg Chest 2 View  Result Date: 02/19/2013 CLINICAL DATA:  Bronchitis. Three month followup. EXAM: CHEST  2 VIEW COMPARISON:  10/04/2011. FINDINGS: The heart size and mediastinal contours are within normal limits. Both lungs are clear. The bony thorax is demineralized but intact. IMPRESSION: No active cardiopulmonary disease. Electronically Signed   By: Lajean Manes M.D.   On: 02/19/2013 16:31    Assessment & Plan:   There are no diagnoses linked to this encounter. I am having Donald Saxon A. Wollenberg Jr. maintain his cholecalciferol, Vitamin B-12, Biotin, Turmeric Curcumin, amLODipine, sildenafil, levothyroxine, terazosin, atorvastatin, levothyroxine, fenofibrate, and buPROPion.  No orders of the defined types were placed in this encounter.    Follow-up: No Follow-up on file.  Walker Kehr, MD

## 2017-05-26 ENCOUNTER — Other Ambulatory Visit: Payer: Self-pay | Admitting: Internal Medicine

## 2017-05-30 ENCOUNTER — Other Ambulatory Visit: Payer: Self-pay | Admitting: Internal Medicine

## 2017-06-06 DIAGNOSIS — L570 Actinic keratosis: Secondary | ICD-10-CM | POA: Diagnosis not present

## 2017-06-06 DIAGNOSIS — Z85828 Personal history of other malignant neoplasm of skin: Secondary | ICD-10-CM | POA: Diagnosis not present

## 2017-06-06 DIAGNOSIS — L579 Skin changes due to chronic exposure to nonionizing radiation, unspecified: Secondary | ICD-10-CM | POA: Diagnosis not present

## 2017-06-06 DIAGNOSIS — L821 Other seborrheic keratosis: Secondary | ICD-10-CM | POA: Diagnosis not present

## 2017-06-06 DIAGNOSIS — L814 Other melanin hyperpigmentation: Secondary | ICD-10-CM | POA: Diagnosis not present

## 2017-06-13 ENCOUNTER — Other Ambulatory Visit: Payer: Self-pay | Admitting: Internal Medicine

## 2017-06-14 ENCOUNTER — Telehealth: Payer: Self-pay

## 2017-06-14 NOTE — Telephone Encounter (Signed)
Key: [id number] faxed into pharmacy today via Cover My Meds.

## 2017-07-01 NOTE — Telephone Encounter (Signed)
PA approved 05/08/17-05/09/18

## 2017-08-16 ENCOUNTER — Telehealth: Payer: Self-pay | Admitting: Internal Medicine

## 2017-08-16 DIAGNOSIS — E7800 Pure hypercholesterolemia, unspecified: Secondary | ICD-10-CM

## 2017-08-16 DIAGNOSIS — E782 Mixed hyperlipidemia: Secondary | ICD-10-CM

## 2017-08-16 DIAGNOSIS — E538 Deficiency of other specified B group vitamins: Secondary | ICD-10-CM

## 2017-08-16 DIAGNOSIS — I10 Essential (primary) hypertension: Secondary | ICD-10-CM

## 2017-08-16 DIAGNOSIS — E559 Vitamin D deficiency, unspecified: Secondary | ICD-10-CM

## 2017-08-16 DIAGNOSIS — E034 Atrophy of thyroid (acquired): Secondary | ICD-10-CM

## 2017-08-16 NOTE — Telephone Encounter (Signed)
Please advise on labs.

## 2017-08-16 NOTE — Telephone Encounter (Signed)
Copied from [place] [id number]. Topic: Quick Communication - See Telephone Encounter >> Aug 16, 2017  8:32 AM Synthia Innocent wrote: CRM for notification. See Telephone encounter for: 08/16/17. Requesting lab work prior to appt on 08/23/17, would like to come this week

## 2017-08-17 ENCOUNTER — Other Ambulatory Visit (INDEPENDENT_AMBULATORY_CARE_PROVIDER_SITE_OTHER): Payer: Medicare Other

## 2017-08-17 DIAGNOSIS — E538 Deficiency of other specified B group vitamins: Secondary | ICD-10-CM | POA: Diagnosis not present

## 2017-08-17 DIAGNOSIS — I10 Essential (primary) hypertension: Secondary | ICD-10-CM

## 2017-08-17 DIAGNOSIS — E559 Vitamin D deficiency, unspecified: Secondary | ICD-10-CM | POA: Diagnosis not present

## 2017-08-17 DIAGNOSIS — E782 Mixed hyperlipidemia: Secondary | ICD-10-CM | POA: Diagnosis not present

## 2017-08-17 DIAGNOSIS — E034 Atrophy of thyroid (acquired): Secondary | ICD-10-CM

## 2017-08-17 LAB — BASIC METABOLIC PANEL
BUN: 16 mg/dL (ref 6–23)
CO2: 28 mEq/L (ref 19–32)
CREATININE: 1.36 mg/dL (ref 0.40–1.50)
Calcium: 9.2 mg/dL (ref 8.4–10.5)
Chloride: 108 mEq/L (ref 96–112)
GFR: 54.97 mL/min — AB (ref >60.00)
GLUCOSE: 103 mg/dL — AB (ref 70–99)
POTASSIUM: 5 meq/L (ref 3.5–5.1)
Sodium: 142 mEq/L (ref 135–145)

## 2017-08-17 LAB — CBC WITH DIFFERENTIAL/PLATELET
BASOS PCT: 0.9 % (ref 0.0–3.0)
Basophils Absolute: 0 10*3/uL (ref 0.0–0.1)
EOS ABS: 0.3 10*3/uL (ref 0.0–0.7)
EOS PCT: 4.5 % (ref 0.0–5.0)
HCT: 38.9 % — ABNORMAL LOW (ref 39.0–52.0)
Hemoglobin: 13.3 g/dL (ref 13.0–17.0)
LYMPHS ABS: 1.6 10*3/uL (ref 0.7–4.0)
Lymphocytes Relative: 27.4 % (ref 12.0–46.0)
MCHC: 34.1 g/dL (ref 30.0–36.0)
MCV: 92.5 fl (ref 78.0–100.0)
MONO ABS: 0.5 10*3/uL (ref 0.1–1.0)
Monocytes Relative: 8.4 % (ref 3.0–12.0)
NEUTROS PCT: 58.8 % (ref 43.0–77.0)
Neutro Abs: 3.3 10*3/uL (ref 1.4–7.7)
Platelets: 256 10*3/uL (ref 150.0–400.0)
RBC: 4.21 Mil/uL — AB (ref 4.22–5.81)
RDW: 14.3 % (ref 11.5–15.5)
WBC: 5.7 10*3/uL (ref 4.0–10.5)

## 2017-08-17 LAB — URINALYSIS, ROUTINE W REFLEX MICROSCOPIC
Bilirubin Urine: NEGATIVE
Ketones, ur: NEGATIVE
Leukocytes, UA: NEGATIVE
Nitrite: NEGATIVE
PH: 5.5 (ref 5.0–8.0)
Specific Gravity, Urine: >=1.03 — AB (ref 1.000–1.030)
TOTAL PROTEIN, URINE-UPE24: NEGATIVE
UROBILINOGEN UA: 0.2 (ref 0.0–1.0)
Urine Glucose: NEGATIVE

## 2017-08-17 LAB — LIPID PANEL
CHOLESTEROL: 151 mg/dL (ref 0–200)
HDL: 43.6 mg/dL (ref >39.00)
LDL CALC: 85 mg/dL (ref 0–99)
NonHDL: 107.31
Total CHOL/HDL Ratio: 3
Triglycerides: 111 mg/dL (ref 0.0–149.0)
VLDL: 22.2 mg/dL (ref 0.0–40.0)

## 2017-08-17 LAB — VITAMIN B12: Vitamin B-12: 1231 pg/mL — ABNORMAL HIGH (ref 211–911)

## 2017-08-17 LAB — TSH: TSH: <0.01 u[IU]/mL — ABNORMAL LOW (ref 0.35–4.50)

## 2017-08-17 LAB — VITAMIN D 25 HYDROXY (VIT D DEFICIENCY, FRACTURES): VITD: 40.21 ng/mL (ref 30.00–100.00)

## 2017-08-17 NOTE — Telephone Encounter (Signed)
Labs entered based off DXs, pt was in lab

## 2017-08-19 NOTE — Telephone Encounter (Signed)
Labs were done on 4/10. I'm not sure I understand Thx

## 2017-08-23 ENCOUNTER — Encounter: Payer: Self-pay | Admitting: Internal Medicine

## 2017-08-23 ENCOUNTER — Ambulatory Visit (INDEPENDENT_AMBULATORY_CARE_PROVIDER_SITE_OTHER): Payer: Medicare Other | Admitting: Internal Medicine

## 2017-08-23 VITALS — BP 124/76 | HR 63 | Temp 98.0°F | Ht 69.0 in | Wt 207.0 lb

## 2017-08-23 DIAGNOSIS — G8929 Other chronic pain: Secondary | ICD-10-CM

## 2017-08-23 DIAGNOSIS — E538 Deficiency of other specified B group vitamins: Secondary | ICD-10-CM

## 2017-08-23 DIAGNOSIS — E034 Atrophy of thyroid (acquired): Secondary | ICD-10-CM | POA: Diagnosis not present

## 2017-08-23 DIAGNOSIS — E559 Vitamin D deficiency, unspecified: Secondary | ICD-10-CM | POA: Diagnosis not present

## 2017-08-23 DIAGNOSIS — F329 Major depressive disorder, single episode, unspecified: Secondary | ICD-10-CM | POA: Diagnosis not present

## 2017-08-23 DIAGNOSIS — M545 Low back pain: Secondary | ICD-10-CM

## 2017-08-23 DIAGNOSIS — R3129 Other microscopic hematuria: Secondary | ICD-10-CM | POA: Diagnosis not present

## 2017-08-23 DIAGNOSIS — I10 Essential (primary) hypertension: Secondary | ICD-10-CM | POA: Diagnosis not present

## 2017-08-23 DIAGNOSIS — Z1211 Encounter for screening for malignant neoplasm of colon: Secondary | ICD-10-CM | POA: Diagnosis not present

## 2017-08-23 MED ORDER — LEVOTHYROXINE SODIUM 150 MCG PO TABS: 150.0000 ug | ORAL_TABLET | Freq: Every day | ORAL | 3 refills | Status: DC

## 2017-08-23 NOTE — Assessment & Plan Note (Signed)
Urology referral

## 2017-08-23 NOTE — Assessment & Plan Note (Signed)
On Wellbutrin XL 

## 2017-08-23 NOTE — Assessment & Plan Note (Signed)
Doing well 

## 2017-08-23 NOTE — Addendum Note (Signed)
Addended by: Karren Cobble on: 08/23/2017 09:25 AM   Modules accepted: Orders

## 2017-08-23 NOTE — Assessment & Plan Note (Signed)
On B12 

## 2017-08-23 NOTE — Assessment & Plan Note (Signed)
On Vit D 

## 2017-08-23 NOTE — Progress Notes (Signed)
Subjective:  Patient ID: Donald Phillips., male    DOB: 1945/05/25  Age: 72 y.o. MRN: [id number]  CC: No chief complaint on file.   HPI Donald Phillips. presents for dyslipidemia, hypothyroidism, depression f/u  Outpatient Medications Prior to Visit  Medication Sig Dispense Refill  . amLODipine (NORVASC) 5 MG tablet TAKE ONE TABLET BY MOUTH ONCE DAILY 90 tablet 3  . atorvastatin (LIPITOR) 20 MG tablet     . Biotin 5000 MCG TABS Take 1 tablet by mouth daily.    Marland Kitchen buPROPion (WELLBUTRIN XL) 300 MG 24 hr tablet TAKE ONE TABLET BY MOUTH ONCE DAILY 90 tablet 3  . Cholecalciferol (VITAMIN D3) 1000 UNITS tablet Take 1,000 Units by mouth daily.      . Cyanocobalamin (VITAMIN B-12) 1000 MCG SUBL Place 1 tablet (1,000 mcg total) under the tongue daily. 100 tablet 3  . fenofibrate (TRICOR) 145 MG tablet TAKE 1 TABLET BY MOUTH ONCE DAILY 90 tablet 3  . levothyroxine (SYNTHROID, LEVOTHROID) 175 MCG tablet TAKE 1 TABLET BY MOUTH ONCE DAILY BEFORE BREAKFAST. 90 tablet 1  . Pitavastatin Calcium (LIVALO) 1 MG TABS Take 1 tablet (1 mg total) by mouth daily. 30 tablet 11  . terazosin (HYTRIN) 5 MG capsule TAKE 1 CAPSULE BY MOUTH AT BEDTIME 90 capsule 3  . Turmeric Curcumin 500 MG CAPS Take by mouth 1 day or 1 dose.    . sildenafil (VIAGRA) 100 MG tablet Take 1 tablet (100 mg total) by mouth as needed for erectile dysfunction. 30 tablet 5  . levothyroxine (SYNTHROID, LEVOTHROID) 137 MCG tablet Take 1 tablet (137 mcg total) by mouth daily before breakfast. 30 tablet 11   No facility-administered medications prior to visit.     ROS Review of Systems  Constitutional: Positive for unexpected weight change. Negative for appetite change and fatigue.  HENT: Negative for congestion, nosebleeds, sneezing, sore throat and trouble swallowing.   Eyes: Negative for itching and visual disturbance.  Respiratory: Negative for cough.   Cardiovascular: Negative for chest pain, palpitations and leg swelling.    Gastrointestinal: Negative for abdominal distention, blood in stool, diarrhea and nausea.  Genitourinary: Negative for frequency and hematuria.  Musculoskeletal: Negative for back pain, gait problem, joint swelling and neck pain.  Skin: Negative for rash.  Neurological: Negative for dizziness, tremors, speech difficulty and weakness.  Psychiatric/Behavioral: Negative for agitation, dysphoric mood, sleep disturbance and suicidal ideas. The patient is not nervous/anxious.     Objective:  BP 124/76 (BP Location: Left Arm, Patient Position: Sitting, Cuff Size: Large)   Pulse 63   Temp 98 F (36.7 C) (Oral)   Ht 5\' 9"  (1.753 m)   Wt 207 lb (93.9 kg)   SpO2 99%   BMI 30.57 kg/m   BP Readings from Last 3 Encounters:  08/23/17 124/76  05/24/17 126/72  11/18/16 126/72    Wt Readings from Last 3 Encounters:  08/23/17 207 lb (93.9 kg)  05/24/17 214 lb (97.1 kg)  11/18/16 212 lb (96.2 kg)    Physical Exam  Constitutional: He is oriented to person, place, and time. He appears well-developed. No distress.  NAD  HENT:  Mouth/Throat: Oropharynx is clear and moist.  Eyes: Pupils are equal, round, and reactive to light. Conjunctivae are normal.  Neck: Normal range of motion. No JVD present. No thyromegaly present.  Cardiovascular: Normal rate, regular rhythm, normal heart sounds and intact distal pulses. Exam reveals no gallop and no friction rub.  No murmur heard. Pulmonary/Chest: Effort  normal and breath sounds normal. No respiratory distress. He has no wheezes. He has no rales. He exhibits no tenderness.  Abdominal: Soft. Bowel sounds are normal. He exhibits no distension and no mass. There is no tenderness. There is no rebound and no guarding.  Musculoskeletal: Normal range of motion. He exhibits no edema or tenderness.  Lymphadenopathy:    He has no cervical adenopathy.  Neurological: He is alert and oriented to person, place, and time. He has normal reflexes. No cranial nerve  deficit. He exhibits normal muscle tone. He displays a negative Romberg sign. Coordination and gait normal.  Skin: Skin is warm and dry. No rash noted.  Psychiatric: He has a normal mood and affect. His behavior is normal. Judgment and thought content normal.    Lab Results  Component Value Date   WBC 5.7 08/17/2017   HGB 13.3 08/17/2017   HCT 38.9 (L) 08/17/2017   PLT 256.0 08/17/2017   GLUCOSE 103 (H) 08/17/2017   CHOL 151 08/17/2017   TRIG 111.0 08/17/2017   HDL 43.60 08/17/2017   LDLDIRECT 117.0 09/04/2015   LDLCALC 85 08/17/2017   ALT 22 04/19/2016   AST 20 04/19/2016   NA 142 08/17/2017   K 5.0 08/17/2017   CL 108 08/17/2017   CREATININE 1.36 08/17/2017   BUN 16 08/17/2017   CO2 28 08/17/2017   TSH <0.01 (L) 08/17/2017   PSA 2.79 04/19/2016   INR 1.00 10/04/2011   HGBA1C 5.9 09/04/2015    Dg Chest 2 View  Result Date: 02/19/2013 CLINICAL DATA:  Bronchitis. Three month followup. EXAM: CHEST  2 VIEW COMPARISON:  10/04/2011. FINDINGS: The heart size and mediastinal contours are within normal limits. Both lungs are clear. The bony thorax is demineralized but intact. IMPRESSION: No active cardiopulmonary disease. Electronically Signed   By: Lajean Manes M.D.   On: 02/19/2013 16:31    Assessment & Plan:   There are no diagnoses linked to this encounter. I am having Gwyndolyn Saxon A. Belling Jr. maintain his cholecalciferol, Vitamin B-12, Biotin, Turmeric Curcumin, sildenafil, terazosin, fenofibrate, buPROPion, Pitavastatin Calcium, levothyroxine, amLODipine, and atorvastatin.  No orders of the defined types were placed in this encounter.    Follow-up: No follow-ups on file.  Walker Kehr, MD

## 2017-08-23 NOTE — Assessment & Plan Note (Addendum)
On Levothroid. FT4 next tine Reduce Levothroid

## 2017-08-23 NOTE — Assessment & Plan Note (Signed)
Hytrin, Norvasc 

## 2017-08-29 DIAGNOSIS — Z1211 Encounter for screening for malignant neoplasm of colon: Secondary | ICD-10-CM | POA: Diagnosis not present

## 2017-08-29 LAB — COLOGUARD

## 2017-09-22 DIAGNOSIS — R3121 Asymptomatic microscopic hematuria: Secondary | ICD-10-CM | POA: Diagnosis not present

## 2017-11-03 DIAGNOSIS — Z85820 Personal history of malignant melanoma of skin: Secondary | ICD-10-CM | POA: Diagnosis not present

## 2017-11-03 DIAGNOSIS — D485 Neoplasm of uncertain behavior of skin: Secondary | ICD-10-CM | POA: Diagnosis not present

## 2017-11-03 DIAGNOSIS — L570 Actinic keratosis: Secondary | ICD-10-CM | POA: Diagnosis not present

## 2017-11-03 DIAGNOSIS — L814 Other melanin hyperpigmentation: Secondary | ICD-10-CM | POA: Diagnosis not present

## 2017-11-03 DIAGNOSIS — Z85828 Personal history of other malignant neoplasm of skin: Secondary | ICD-10-CM | POA: Diagnosis not present

## 2017-11-03 DIAGNOSIS — L579 Skin changes due to chronic exposure to nonionizing radiation, unspecified: Secondary | ICD-10-CM | POA: Diagnosis not present

## 2017-11-03 DIAGNOSIS — D1801 Hemangioma of skin and subcutaneous tissue: Secondary | ICD-10-CM | POA: Diagnosis not present

## 2017-11-03 DIAGNOSIS — L821 Other seborrheic keratosis: Secondary | ICD-10-CM | POA: Diagnosis not present

## 2017-11-03 DIAGNOSIS — D2361 Other benign neoplasm of skin of right upper limb, including shoulder: Secondary | ICD-10-CM | POA: Diagnosis not present

## 2017-11-21 DIAGNOSIS — L600 Ingrowing nail: Secondary | ICD-10-CM | POA: Diagnosis not present

## 2017-11-21 DIAGNOSIS — L03031 Cellulitis of right toe: Secondary | ICD-10-CM | POA: Diagnosis not present

## 2017-11-23 ENCOUNTER — Ambulatory Visit (INDEPENDENT_AMBULATORY_CARE_PROVIDER_SITE_OTHER): Payer: Medicare Other | Admitting: Internal Medicine

## 2017-11-23 ENCOUNTER — Encounter: Payer: Self-pay | Admitting: Internal Medicine

## 2017-11-23 ENCOUNTER — Other Ambulatory Visit (INDEPENDENT_AMBULATORY_CARE_PROVIDER_SITE_OTHER): Payer: Medicare Other

## 2017-11-23 VITALS — BP 130/74 | HR 67 | Temp 98.0°F | Ht 69.0 in | Wt 210.0 lb

## 2017-11-23 DIAGNOSIS — E291 Testicular hypofunction: Secondary | ICD-10-CM

## 2017-11-23 DIAGNOSIS — I10 Essential (primary) hypertension: Secondary | ICD-10-CM | POA: Diagnosis not present

## 2017-11-23 DIAGNOSIS — N320 Bladder-neck obstruction: Secondary | ICD-10-CM | POA: Diagnosis not present

## 2017-11-23 DIAGNOSIS — E559 Vitamin D deficiency, unspecified: Secondary | ICD-10-CM | POA: Diagnosis not present

## 2017-11-23 DIAGNOSIS — E034 Atrophy of thyroid (acquired): Secondary | ICD-10-CM | POA: Diagnosis not present

## 2017-11-23 LAB — CBC WITH DIFFERENTIAL/PLATELET
BASOS ABS: 0 10*3/uL (ref 0.0–0.1)
Basophils Relative: 0.8 % (ref 0.0–3.0)
EOS PCT: 3.5 % (ref 0.0–5.0)
Eosinophils Absolute: 0.2 10*3/uL (ref 0.0–0.7)
HEMATOCRIT: 39.3 % (ref 39.0–52.0)
Hemoglobin: 13.4 g/dL (ref 13.0–17.0)
Lymphocytes Relative: 26.1 % (ref 12.0–46.0)
Lymphs Abs: 1.5 10*3/uL (ref 0.7–4.0)
MCHC: 34.1 g/dL (ref 30.0–36.0)
MCV: 93.9 fl (ref 78.0–100.0)
MONO ABS: 0.5 10*3/uL (ref 0.1–1.0)
MONOS PCT: 8.8 % (ref 3.0–12.0)
Neutro Abs: 3.5 10*3/uL (ref 1.4–7.7)
Neutrophils Relative %: 60.8 % (ref 43.0–77.0)
Platelets: 258 10*3/uL (ref 150.0–400.0)
RBC: 4.19 Mil/uL — AB (ref 4.22–5.81)
RDW: 13.5 % (ref 11.5–15.5)
WBC: 5.7 10*3/uL (ref 4.0–10.5)

## 2017-11-23 LAB — BASIC METABOLIC PANEL
BUN: 17 mg/dL (ref 6–23)
CALCIUM: 9 mg/dL (ref 8.4–10.5)
CHLORIDE: 109 meq/L (ref 96–112)
CO2: 25 meq/L (ref 19–32)
Creatinine, Ser: 1.47 mg/dL (ref 0.40–1.50)
GFR: 50.21 mL/min — ABNORMAL LOW (ref >60.00)
GLUCOSE: 106 mg/dL — AB (ref 70–99)
Potassium: 4.2 mEq/L (ref 3.5–5.1)
Sodium: 142 mEq/L (ref 135–145)

## 2017-11-23 LAB — PSA: PSA: 3.69 ng/mL (ref 0.10–4.00)

## 2017-11-23 LAB — HEPATIC FUNCTION PANEL
ALBUMIN: 4 g/dL (ref 3.5–5.2)
ALT: 17 U/L (ref 0–53)
AST: 17 U/L (ref 0–37)
Alkaline Phosphatase: 61 U/L (ref 39–117)
Bilirubin, Direct: 0.1 mg/dL (ref 0.0–0.3)
TOTAL PROTEIN: 6.7 g/dL (ref 6.0–8.3)
Total Bilirubin: 0.4 mg/dL (ref 0.2–1.2)

## 2017-11-23 LAB — TESTOSTERONE: Testosterone: 182.21 ng/dL — ABNORMAL LOW (ref 300.00–890.00)

## 2017-11-23 LAB — TSH: TSH: <0.01 u[IU]/mL — ABNORMAL LOW (ref 0.35–4.50)

## 2017-11-23 LAB — T4, FREE: Free T4: 1.52 ng/dL (ref 0.60–1.60)

## 2017-11-23 MED ORDER — PITAVASTATIN CALCIUM 1 MG PO TABS: 1.0000 mg | ORAL_TABLET | Freq: Every day | ORAL | 11 refills | Status: DC

## 2017-11-23 NOTE — Patient Instructions (Signed)

## 2017-11-23 NOTE — Assessment & Plan Note (Signed)
Hytrin, Norvasc

## 2017-11-23 NOTE — Assessment & Plan Note (Signed)
Vit D 

## 2017-11-23 NOTE — Assessment & Plan Note (Addendum)
Labs IM shots option was discussed -  Potential benefits of a long term sex steroid  use as well as potential risks  and complications were explained to the patient and were aknowledged.

## 2017-11-23 NOTE — Progress Notes (Signed)
Subjective:  Patient ID: Donald Fillers., male    DOB: 02-07-1946  Age: 72 y.o. MRN: [id number]  CC: No chief complaint on file.   HPI Donald Fillers. presents for HTN, dyslipidemia C/o ED, fatigue. Wants to take testosterone  Outpatient Medications Prior to Visit  Medication Sig Dispense Refill  . amLODipine (NORVASC) 5 MG tablet TAKE ONE TABLET BY MOUTH ONCE DAILY 90 tablet 3  . Biotin 5000 MCG TABS Take 1 tablet by mouth daily.    Marland Kitchen buPROPion (WELLBUTRIN XL) 300 MG 24 hr tablet TAKE ONE TABLET BY MOUTH ONCE DAILY 90 tablet 3  . Cholecalciferol (VITAMIN D3) 1000 UNITS tablet Take 1,000 Units by mouth daily.      . Cyanocobalamin (VITAMIN B-12) 1000 MCG SUBL Place 1 tablet (1,000 mcg total) under the tongue daily. 100 tablet 3  . fenofibrate (TRICOR) 145 MG tablet TAKE 1 TABLET BY MOUTH ONCE DAILY 90 tablet 3  . levothyroxine (SYNTHROID, LEVOTHROID) 150 MCG tablet Take 1 tablet (150 mcg total) by mouth daily. 90 tablet 3  . terazosin (HYTRIN) 5 MG capsule TAKE 1 CAPSULE BY MOUTH AT BEDTIME 90 capsule 3  . Turmeric Curcumin 500 MG CAPS Take by mouth 1 day or 1 dose.    Marland Kitchen atorvastatin (LIPITOR) 20 MG tablet     . Pitavastatin Calcium (LIVALO) 1 MG TABS Take 1 tablet (1 mg total) by mouth daily. 30 tablet 11  . sildenafil (VIAGRA) 100 MG tablet Take 1 tablet (100 mg total) by mouth as needed for erectile dysfunction. 30 tablet 5   No facility-administered medications prior to visit.     ROS: Review of Systems  Constitutional: Negative for appetite change, fatigue and unexpected weight change.  HENT: Negative for congestion, nosebleeds, sneezing, sore throat and trouble swallowing.   Eyes: Negative for itching and visual disturbance.  Respiratory: Negative for cough.   Cardiovascular: Negative for chest pain, palpitations and leg swelling.  Gastrointestinal: Negative for abdominal distention, blood in stool, diarrhea and nausea.  Genitourinary: Negative for frequency and  hematuria.  Musculoskeletal: Negative for back pain, gait problem, joint swelling and neck pain.  Skin: Negative for rash.  Neurological: Negative for dizziness, tremors, speech difficulty and weakness.  Psychiatric/Behavioral: Negative for agitation, dysphoric mood and sleep disturbance. The patient is not nervous/anxious.     Objective:  BP 130/74 (BP Location: Left Arm, Patient Position: Sitting, Cuff Size: Normal)   Pulse 67   Temp 98 F (36.7 C) (Oral)   Ht 5\' 9"  (1.753 m)   Wt 210 lb (95.3 kg)   SpO2 96%   BMI 31.01 kg/m   BP Readings from Last 3 Encounters:  11/23/17 130/74  08/23/17 124/76  05/24/17 126/72    Wt Readings from Last 3 Encounters:  11/23/17 210 lb (95.3 kg)  08/23/17 207 lb (93.9 kg)  05/24/17 214 lb (97.1 kg)    Physical Exam  Constitutional: He is oriented to person, place, and time. He appears well-developed. No distress.  NAD  HENT:  Mouth/Throat: Oropharynx is clear and moist.  Eyes: Pupils are equal, round, and reactive to light. Conjunctivae are normal.  Neck: Normal range of motion. No JVD present. No thyromegaly present.  Cardiovascular: Normal rate, regular rhythm, normal heart sounds and intact distal pulses. Exam reveals no gallop and no friction rub.  No murmur heard. Pulmonary/Chest: Effort normal and breath sounds normal. No respiratory distress. He has no wheezes. He has no rales. He exhibits no tenderness.  Abdominal:  Soft. Bowel sounds are normal. He exhibits no distension and no mass. There is no tenderness. There is no rebound and no guarding.  Musculoskeletal: Normal range of motion. He exhibits no edema or tenderness.  Lymphadenopathy:    He has no cervical adenopathy.  Neurological: He is alert and oriented to person, place, and time. He has normal reflexes. No cranial nerve deficit. He exhibits normal muscle tone. He displays a negative Romberg sign. Coordination and gait normal.  Skin: Skin is warm and dry. No rash noted.    Psychiatric: He has a normal mood and affect. His behavior is normal. Judgment and thought content normal.    Lab Results  Component Value Date   WBC 5.7 08/17/2017   HGB 13.3 08/17/2017   HCT 38.9 (L) 08/17/2017   PLT 256.0 08/17/2017   GLUCOSE 103 (H) 08/17/2017   CHOL 151 08/17/2017   TRIG 111.0 08/17/2017   HDL 43.60 08/17/2017   LDLDIRECT 117.0 09/04/2015   LDLCALC 85 08/17/2017   ALT 22 04/19/2016   AST 20 04/19/2016   NA 142 08/17/2017   K 5.0 08/17/2017   CL 108 08/17/2017   CREATININE 1.36 08/17/2017   BUN 16 08/17/2017   CO2 28 08/17/2017   TSH <0.01 (L) 08/17/2017   PSA 2.79 04/19/2016   INR 1.00 10/04/2011   HGBA1C 5.9 09/04/2015    Dg Chest 2 View  Result Date: 02/19/2013 CLINICAL DATA:  Bronchitis. Three month followup. EXAM: CHEST  2 VIEW COMPARISON:  10/04/2011. FINDINGS: The heart size and mediastinal contours are within normal limits. Both lungs are clear. The bony thorax is demineralized but intact. IMPRESSION: No active cardiopulmonary disease. Electronically Signed   By: Lajean Manes M.D.   On: 02/19/2013 16:31    Assessment & Plan:   There are no diagnoses linked to this encounter.   Meds ordered this encounter  Medications  . Pitavastatin Calcium (LIVALO) 1 MG TABS    Sig: Take 1 tablet (1 mg total) by mouth daily.    Dispense:  30 tablet    Refill:  11     Follow-up: No follow-ups on file.  Walker Kehr, MD

## 2017-11-27 ENCOUNTER — Other Ambulatory Visit: Payer: Self-pay | Admitting: Internal Medicine

## 2017-11-27 MED ORDER — "SYRINGE/NEEDLE (DISP) 23G X 1"" 3 ML MISC": 2 refills | Status: DC

## 2017-11-27 MED ORDER — TESTOSTERONE CYPIONATE 200 MG/ML IM SOLN: 200.0000 mg | INTRAMUSCULAR | 5 refills | Status: DC

## 2017-11-29 ENCOUNTER — Ambulatory Visit (INDEPENDENT_AMBULATORY_CARE_PROVIDER_SITE_OTHER): Payer: Medicare Other

## 2017-11-29 DIAGNOSIS — E291 Testicular hypofunction: Secondary | ICD-10-CM | POA: Diagnosis not present

## 2017-11-29 MED ORDER — TESTOSTERONE CYPIONATE 200 MG/ML IM SOLN
100.0000 mg | Freq: Once | INTRAMUSCULAR | Status: AC
  Administered 2017-11-29: 100 mg via INTRAMUSCULAR

## 2017-12-02 ENCOUNTER — Telehealth: Payer: Self-pay | Admitting: Internal Medicine

## 2017-12-02 MED ORDER — "NEEDLE (DISP) 18G X 1"" MISC": 0 refills | Status: DC

## 2017-12-02 NOTE — Telephone Encounter (Signed)
Copied from [place] [phone]. Topic: Quick Communication - See Telephone Encounter >> Dec 02, 2017  9:46 AM Bea Graff, NT wrote: CRM for notification. See Telephone encounter for: 12/02/17. Pt states at his appointment on 11/29/17 it was mentioned that larger needles would be called into his pharmacy for his testerone injections and he was checking status on this being called in.

## 2017-12-02 NOTE — Telephone Encounter (Signed)
RX sent

## 2017-12-07 ENCOUNTER — Other Ambulatory Visit: Payer: Self-pay | Admitting: Internal Medicine

## 2017-12-08 DIAGNOSIS — L600 Ingrowing nail: Secondary | ICD-10-CM | POA: Diagnosis not present

## 2018-01-18 NOTE — Progress Notes (Signed)
I personally reviewed the Note/Plan and I agree.  

## 2018-02-20 ENCOUNTER — Other Ambulatory Visit (INDEPENDENT_AMBULATORY_CARE_PROVIDER_SITE_OTHER): Payer: Medicare Other

## 2018-02-20 DIAGNOSIS — E291 Testicular hypofunction: Secondary | ICD-10-CM

## 2018-02-20 DIAGNOSIS — I10 Essential (primary) hypertension: Secondary | ICD-10-CM

## 2018-02-20 LAB — BASIC METABOLIC PANEL
BUN: 18 mg/dL (ref 6–23)
CHLORIDE: 106 meq/L (ref 96–112)
CO2: 27 mEq/L (ref 19–32)
CREATININE: 1.52 mg/dL — AB (ref 0.40–1.50)
Calcium: 9.4 mg/dL (ref 8.4–10.5)
GFR: 48.28 mL/min — ABNORMAL LOW (ref >60.00)
Glucose, Bld: 114 mg/dL — ABNORMAL HIGH (ref 70–99)
Potassium: 5.1 mEq/L (ref 3.5–5.1)
Sodium: 140 mEq/L (ref 135–145)

## 2018-02-27 ENCOUNTER — Encounter: Payer: Self-pay | Admitting: Internal Medicine

## 2018-02-27 ENCOUNTER — Ambulatory Visit (INDEPENDENT_AMBULATORY_CARE_PROVIDER_SITE_OTHER): Payer: Medicare Other | Admitting: Internal Medicine

## 2018-02-27 VITALS — BP 132/76 | HR 73 | Temp 98.1°F | Ht 69.0 in | Wt 216.0 lb

## 2018-02-27 DIAGNOSIS — E538 Deficiency of other specified B group vitamins: Secondary | ICD-10-CM | POA: Diagnosis not present

## 2018-02-27 DIAGNOSIS — E291 Testicular hypofunction: Secondary | ICD-10-CM

## 2018-02-27 DIAGNOSIS — N183 Chronic kidney disease, stage 3 unspecified: Secondary | ICD-10-CM

## 2018-02-27 DIAGNOSIS — I10 Essential (primary) hypertension: Secondary | ICD-10-CM

## 2018-02-27 DIAGNOSIS — N320 Bladder-neck obstruction: Secondary | ICD-10-CM | POA: Diagnosis not present

## 2018-02-27 DIAGNOSIS — E034 Atrophy of thyroid (acquired): Secondary | ICD-10-CM

## 2018-02-27 DIAGNOSIS — D751 Secondary polycythemia: Secondary | ICD-10-CM

## 2018-02-27 DIAGNOSIS — E559 Vitamin D deficiency, unspecified: Secondary | ICD-10-CM

## 2018-02-27 DIAGNOSIS — R739 Hyperglycemia, unspecified: Secondary | ICD-10-CM

## 2018-02-27 DIAGNOSIS — Z23 Encounter for immunization: Secondary | ICD-10-CM | POA: Diagnosis not present

## 2018-02-27 NOTE — Assessment & Plan Note (Signed)
Hytrin, Norvasc

## 2018-02-27 NOTE — Assessment & Plan Note (Signed)
Vit D 

## 2018-02-27 NOTE — Patient Instructions (Signed)
MC well

## 2018-02-27 NOTE — Assessment & Plan Note (Signed)
Stage 3 a BP control Renal US Nephrol ref offered

## 2018-02-27 NOTE — Assessment & Plan Note (Signed)
Doing well on shots (self shots)

## 2018-02-27 NOTE — Assessment & Plan Note (Signed)
On B12 

## 2018-02-27 NOTE — Progress Notes (Signed)
Subjective:  Patient ID: Donald Fillers., male    DOB: 13-May-1945  Age: 72 y.o. MRN: [id number]  CC: No chief complaint on file.   HPI Donald Fillers. presents for HTN, hythyroidism, depression f/u  Outpatient Medications Prior to Visit  Medication Sig Dispense Refill  . amLODipine (NORVASC) 5 MG tablet TAKE ONE TABLET BY MOUTH ONCE DAILY 90 tablet 3  . Biotin 5000 MCG TABS Take 1 tablet by mouth daily.    Marland Kitchen buPROPion (WELLBUTRIN XL) 300 MG 24 hr tablet TAKE ONE TABLET BY MOUTH ONCE DAILY 90 tablet 3  . Cholecalciferol (VITAMIN D3) 1000 UNITS tablet Take 1,000 Units by mouth daily.      . Cyanocobalamin (VITAMIN B-12) 1000 MCG SUBL Place 1 tablet (1,000 mcg total) under the tongue daily. 100 tablet 3  . fenofibrate (TRICOR) 145 MG tablet TAKE 1 TABLET BY MOUTH ONCE DAILY 90 tablet 3  . levothyroxine (SYNTHROID, LEVOTHROID) 150 MCG tablet Take 1 tablet (150 mcg total) by mouth daily. 90 tablet 3  . NEEDLE, DISP, 18 G 18G X 1" MISC Use to draw up testosterone injection 50 each 0  . Pitavastatin Calcium (LIVALO) 1 MG TABS Take 1 tablet (1 mg total) by mouth daily. 30 tablet 11  . SYRINGE-NEEDLE, DISP, 3 ML (BD ECLIPSE SYRINGE) 23G X 1" 3 ML MISC Use IM as directed 50 each 2  . terazosin (HYTRIN) 5 MG capsule TAKE 1 CAPSULE BY MOUTH AT BEDTIME 90 capsule 3  . testosterone cypionate (DEPOTESTOSTERONE CYPIONATE) 200 MG/ML injection Inject 1 mL (200 mg total) into the muscle every 14 (fourteen) days. 10 mL 5  . Turmeric Curcumin 500 MG CAPS Take by mouth 1 day or 1 dose.    . sildenafil (VIAGRA) 100 MG tablet Take 1 tablet (100 mg total) by mouth as needed for erectile dysfunction. 30 tablet 5   No facility-administered medications prior to visit.     ROS: Review of Systems  Constitutional: Negative for appetite change, fatigue and unexpected weight change.  HENT: Negative for congestion, nosebleeds, sneezing, sore throat and trouble swallowing.   Eyes: Negative for itching and  visual disturbance.  Respiratory: Negative for cough.   Cardiovascular: Negative for chest pain, palpitations and leg swelling.  Gastrointestinal: Negative for abdominal distention, blood in stool, diarrhea and nausea.  Genitourinary: Negative for frequency and hematuria.  Musculoskeletal: Negative for back pain, gait problem, joint swelling and neck pain.  Skin: Negative for rash.  Neurological: Negative for dizziness, tremors, speech difficulty and weakness.  Psychiatric/Behavioral: Negative for agitation, dysphoric mood, sleep disturbance and suicidal ideas. The patient is not nervous/anxious.     Objective:  BP 132/76 (BP Location: Left Arm, Patient Position: Sitting, Cuff Size: Large)   Pulse 73   Temp 98.1 F (36.7 C) (Oral)   Ht 5\' 9"  (1.753 m)   Wt 216 lb (98 kg)   SpO2 95%   BMI 31.90 kg/m   BP Readings from Last 3 Encounters:  02/27/18 132/76  11/23/17 130/74  08/23/17 124/76    Wt Readings from Last 3 Encounters:  02/27/18 216 lb (98 kg)  11/23/17 210 lb (95.3 kg)  08/23/17 207 lb (93.9 kg)    Physical Exam  Constitutional: He is oriented to person, place, and time. He appears well-developed. No distress.  NAD  HENT:  Mouth/Throat: Oropharynx is clear and moist.  Eyes: Pupils are equal, round, and reactive to light. Conjunctivae are normal.  Neck: Normal range of motion. No JVD  present. No thyromegaly present.  Cardiovascular: Normal rate, regular rhythm, normal heart sounds and intact distal pulses. Exam reveals no gallop and no friction rub.  No murmur heard. Pulmonary/Chest: Effort normal and breath sounds normal. No respiratory distress. He has no wheezes. He has no rales. He exhibits no tenderness.  Abdominal: Soft. Bowel sounds are normal. He exhibits no distension and no mass. There is no tenderness. There is no rebound and no guarding.  Musculoskeletal: Normal range of motion. He exhibits no edema or tenderness.  Lymphadenopathy:    He has no cervical  adenopathy.  Neurological: He is alert and oriented to person, place, and time. He has normal reflexes. No cranial nerve deficit. He exhibits normal muscle tone. He displays a negative Romberg sign. Coordination and gait normal.  Skin: Skin is warm and dry. No rash noted.  Psychiatric: He has a normal mood and affect. His behavior is normal. Judgment and thought content normal.    Lab Results  Component Value Date   WBC 5.7 11/23/2017   HGB 13.4 11/23/2017   HCT 39.3 11/23/2017   PLT 258.0 11/23/2017   GLUCOSE 114 (H) 02/20/2018   CHOL 151 08/17/2017   TRIG 111.0 08/17/2017   HDL 43.60 08/17/2017   LDLDIRECT 117.0 09/04/2015   LDLCALC 85 08/17/2017   ALT 17 11/23/2017   AST 17 11/23/2017   NA 140 02/20/2018   K 5.1 02/20/2018   CL 106 02/20/2018   CREATININE 1.52 (H) 02/20/2018   BUN 18 02/20/2018   CO2 27 02/20/2018   TSH <0.01 (L) 11/23/2017   PSA 3.69 11/23/2017   INR 1.00 10/04/2011   HGBA1C 5.9 09/04/2015    Dg Chest 2 View  Result Date: 02/19/2013 CLINICAL DATA:  Bronchitis. Three month followup. EXAM: CHEST  2 VIEW COMPARISON:  10/04/2011. FINDINGS: The heart size and mediastinal contours are within normal limits. Both lungs are clear. The bony thorax is demineralized but intact. IMPRESSION: No active cardiopulmonary disease. Electronically Signed   By: Lajean Manes M.D.   On: 02/19/2013 16:31    Assessment & Plan:   There are no diagnoses linked to this encounter.   No orders of the defined types were placed in this encounter.    Follow-up: No follow-ups on file.  Walker Kehr, MD

## 2018-02-27 NOTE — Assessment & Plan Note (Signed)
Will watch on Testosterone Rx

## 2018-02-27 NOTE — Assessment & Plan Note (Signed)
On Levothroid. FT4 - WNL

## 2018-03-03 ENCOUNTER — Ambulatory Visit
Admission: RE | Admit: 2018-03-03 | Discharge: 2018-03-03 | Disposition: A | Discharge status: Home / Self Care | Payer: Medicare Other | Source: Ambulatory Visit | Attending: Internal Medicine | Admitting: Internal Medicine

## 2018-03-03 DIAGNOSIS — N183 Chronic kidney disease, stage 3 unspecified: Secondary | ICD-10-CM

## 2018-03-03 DIAGNOSIS — N320 Bladder-neck obstruction: Secondary | ICD-10-CM

## 2018-03-07 ENCOUNTER — Other Ambulatory Visit: Payer: Self-pay | Admitting: Internal Medicine

## 2018-03-07 DIAGNOSIS — N281 Cyst of kidney, acquired: Secondary | ICD-10-CM

## 2018-03-07 DIAGNOSIS — N2889 Other specified disorders of kidney and ureter: Secondary | ICD-10-CM

## 2018-03-14 NOTE — Addendum Note (Signed)
Addended by: Karren Cobble on: 03/14/2018 09:49 AM   Modules accepted: Orders

## 2018-03-21 ENCOUNTER — Ambulatory Visit
Admission: RE | Admit: 2018-03-21 | Discharge: 2018-03-21 | Disposition: A | Discharge status: Home / Self Care | Payer: Medicare Other | Source: Ambulatory Visit | Attending: Internal Medicine | Admitting: Internal Medicine

## 2018-03-21 DIAGNOSIS — N281 Cyst of kidney, acquired: Secondary | ICD-10-CM | POA: Diagnosis not present

## 2018-03-21 DIAGNOSIS — N2889 Other specified disorders of kidney and ureter: Secondary | ICD-10-CM

## 2018-03-21 MED ORDER — GADOBENATE DIMEGLUMINE 529 MG/ML IV SOLN
20.0000 mL | Freq: Once | INTRAVENOUS | Status: AC | PRN
  Administered 2018-03-21: 20 mL via INTRAVENOUS

## 2018-04-20 ENCOUNTER — Other Ambulatory Visit: Payer: Self-pay | Admitting: Internal Medicine

## 2018-05-15 DIAGNOSIS — L821 Other seborrheic keratosis: Secondary | ICD-10-CM | POA: Diagnosis not present

## 2018-05-15 DIAGNOSIS — Z85828 Personal history of other malignant neoplasm of skin: Secondary | ICD-10-CM | POA: Diagnosis not present

## 2018-05-15 DIAGNOSIS — L570 Actinic keratosis: Secondary | ICD-10-CM | POA: Diagnosis not present

## 2018-05-15 DIAGNOSIS — D1801 Hemangioma of skin and subcutaneous tissue: Secondary | ICD-10-CM | POA: Diagnosis not present

## 2018-05-15 DIAGNOSIS — L579 Skin changes due to chronic exposure to nonionizing radiation, unspecified: Secondary | ICD-10-CM | POA: Diagnosis not present

## 2018-05-15 DIAGNOSIS — Z85820 Personal history of malignant melanoma of skin: Secondary | ICD-10-CM | POA: Diagnosis not present

## 2018-05-15 DIAGNOSIS — L814 Other melanin hyperpigmentation: Secondary | ICD-10-CM | POA: Diagnosis not present

## 2018-05-26 DIAGNOSIS — H5213 Myopia, bilateral: Secondary | ICD-10-CM | POA: Diagnosis not present

## 2018-05-26 DIAGNOSIS — H2513 Age-related nuclear cataract, bilateral: Secondary | ICD-10-CM | POA: Diagnosis not present

## 2018-05-26 DIAGNOSIS — H524 Presbyopia: Secondary | ICD-10-CM | POA: Diagnosis not present

## 2018-05-26 DIAGNOSIS — H47323 Drusen of optic disc, bilateral: Secondary | ICD-10-CM | POA: Diagnosis not present

## 2018-06-02 ENCOUNTER — Other Ambulatory Visit: Payer: Self-pay | Admitting: Internal Medicine

## 2018-07-03 ENCOUNTER — Other Ambulatory Visit (INDEPENDENT_AMBULATORY_CARE_PROVIDER_SITE_OTHER): Payer: Medicare Other

## 2018-07-03 DIAGNOSIS — I10 Essential (primary) hypertension: Secondary | ICD-10-CM

## 2018-07-03 DIAGNOSIS — R739 Hyperglycemia, unspecified: Secondary | ICD-10-CM

## 2018-07-03 DIAGNOSIS — N320 Bladder-neck obstruction: Secondary | ICD-10-CM

## 2018-07-03 DIAGNOSIS — E291 Testicular hypofunction: Secondary | ICD-10-CM

## 2018-07-03 DIAGNOSIS — E034 Atrophy of thyroid (acquired): Secondary | ICD-10-CM | POA: Diagnosis not present

## 2018-07-03 LAB — HEPATIC FUNCTION PANEL
ALT: 15 U/L (ref 0–53)
AST: 18 U/L (ref 0–37)
Albumin: 4.1 g/dL (ref 3.5–5.2)
Alkaline Phosphatase: 56 U/L (ref 39–117)
BILIRUBIN DIRECT: 0.1 mg/dL (ref 0.0–0.3)
BILIRUBIN TOTAL: 0.7 mg/dL (ref 0.2–1.2)
TOTAL PROTEIN: 6.8 g/dL (ref 6.0–8.3)

## 2018-07-03 LAB — LIPID PANEL
Cholesterol: 212 mg/dL — ABNORMAL HIGH (ref 0–200)
HDL: 41.5 mg/dL (ref >39.00)
LDL CALC: 139 mg/dL — AB (ref 0–99)
NonHDL: 170.54
TRIGLYCERIDES: 160 mg/dL — AB (ref 0.0–149.0)
Total CHOL/HDL Ratio: 5
VLDL: 32 mg/dL (ref 0.0–40.0)

## 2018-07-03 LAB — CBC WITH DIFFERENTIAL/PLATELET
BASOS PCT: 1 % (ref 0.0–3.0)
Basophils Absolute: 0.1 10*3/uL (ref 0.0–0.1)
EOS PCT: 3.5 % (ref 0.0–5.0)
Eosinophils Absolute: 0.2 10*3/uL (ref 0.0–0.7)
HEMATOCRIT: 50.1 % (ref 39.0–52.0)
HEMOGLOBIN: 16.9 g/dL (ref 13.0–17.0)
LYMPHS PCT: 26.9 % (ref 12.0–46.0)
Lymphs Abs: 1.9 10*3/uL (ref 0.7–4.0)
MCHC: 33.7 g/dL (ref 30.0–36.0)
MCV: 96.2 fl (ref 78.0–100.0)
MONO ABS: 0.6 10*3/uL (ref 0.1–1.0)
Monocytes Relative: 8.3 % (ref 3.0–12.0)
Neutro Abs: 4.2 10*3/uL (ref 1.4–7.7)
Neutrophils Relative %: 60.3 % (ref 43.0–77.0)
Platelets: 244 10*3/uL (ref 150.0–400.0)
RBC: 5.21 Mil/uL (ref 4.22–5.81)
RDW: 13.9 % (ref 11.5–15.5)
WBC: 7 10*3/uL (ref 4.0–10.5)

## 2018-07-03 LAB — HEMOGLOBIN A1C: Hgb A1c MFr Bld: 5.7 % (ref 4.6–6.5)

## 2018-07-03 LAB — BASIC METABOLIC PANEL
BUN: 15 mg/dL (ref 6–23)
CALCIUM: 9.1 mg/dL (ref 8.4–10.5)
CO2: 26 meq/L (ref 19–32)
Chloride: 106 mEq/L (ref 96–112)
Creatinine, Ser: 1.62 mg/dL — ABNORMAL HIGH (ref 0.40–1.50)
GFR: 42.16 mL/min — AB (ref >60.00)
GLUCOSE: 95 mg/dL (ref 70–99)
Potassium: 5.5 mEq/L — ABNORMAL HIGH (ref 3.5–5.1)
SODIUM: 142 meq/L (ref 135–145)

## 2018-07-03 LAB — TESTOSTERONE: TESTOSTERONE: 1024.71 ng/dL — AB (ref 300.00–890.00)

## 2018-07-03 LAB — TSH: TSH: 0.04 u[IU]/mL — ABNORMAL LOW (ref 0.35–4.50)

## 2018-07-03 LAB — PSA: PSA: 3.74 ng/mL (ref 0.10–4.00)

## 2018-07-03 LAB — T4, FREE: Free T4: 1.09 ng/dL (ref 0.60–1.60)

## 2018-07-05 ENCOUNTER — Encounter: Payer: Self-pay | Admitting: Internal Medicine

## 2018-07-05 ENCOUNTER — Ambulatory Visit (INDEPENDENT_AMBULATORY_CARE_PROVIDER_SITE_OTHER): Payer: Medicare Other | Admitting: Internal Medicine

## 2018-07-05 VITALS — BP 140/78 | HR 67 | Temp 97.7°F | Ht 69.0 in | Wt 220.0 lb

## 2018-07-05 DIAGNOSIS — I10 Essential (primary) hypertension: Secondary | ICD-10-CM

## 2018-07-05 DIAGNOSIS — E034 Atrophy of thyroid (acquired): Secondary | ICD-10-CM

## 2018-07-05 DIAGNOSIS — E291 Testicular hypofunction: Secondary | ICD-10-CM | POA: Diagnosis not present

## 2018-07-05 DIAGNOSIS — E785 Hyperlipidemia, unspecified: Secondary | ICD-10-CM | POA: Diagnosis not present

## 2018-07-05 DIAGNOSIS — N320 Bladder-neck obstruction: Secondary | ICD-10-CM

## 2018-07-05 MED ORDER — SILDENAFIL CITRATE 100 MG PO TABS: 100.0000 mg | ORAL_TABLET | ORAL | 5 refills | Status: AC | PRN

## 2018-07-05 NOTE — Patient Instructions (Signed)

## 2018-07-05 NOTE — Assessment & Plan Note (Signed)
FT4 ok Levothroid

## 2018-07-05 NOTE — Assessment & Plan Note (Addendum)
A cardiac CT scan for calcium scoring test was offered  Worse - no change in meds per pt

## 2018-07-05 NOTE — Assessment & Plan Note (Signed)
A cardiac CT scan for calcium scoring test was offered 2/20 The latest test was done 1 wk after the shot

## 2018-07-05 NOTE — Assessment & Plan Note (Signed)
A cardiac CT scan for calcium scoring test was offered

## 2018-07-05 NOTE — Progress Notes (Signed)
Subjective:  Patient ID: Donald Fillers., male    DOB: April 02, 1946  Age: 73 y.o. MRN: [id number]  CC: No chief complaint on file.   HPI Donald Fillers. presents for hypothyroidism, HTN, dyslipidemia f/u  Outpatient Medications Prior to Visit  Medication Sig Dispense Refill  . amLODipine (NORVASC) 5 MG tablet TAKE 1 TABLET BY MOUTH ONCE DAILY 90 tablet 1  . Biotin 5000 MCG TABS Take 1 tablet by mouth daily.    Marland Kitchen buPROPion (WELLBUTRIN XL) 300 MG 24 hr tablet TAKE 1 TABLET BY MOUTH ONCE DAILY 90 tablet 1  . Cholecalciferol (VITAMIN D3) 1000 UNITS tablet Take 1,000 Units by mouth daily.      . Cyanocobalamin (VITAMIN B-12) 1000 MCG SUBL Place 1 tablet (1,000 mcg total) under the tongue daily. 100 tablet 3  . fenofibrate (TRICOR) 145 MG tablet TAKE 1 TABLET BY MOUTH ONCE DAILY 90 tablet 1  . levothyroxine (SYNTHROID, LEVOTHROID) 150 MCG tablet Take 1 tablet (150 mcg total) by mouth daily. 90 tablet 3  . NEEDLE, DISP, 18 G 18G X 1" MISC Use to draw up testosterone injection 50 each 0  . Pitavastatin Calcium (LIVALO) 1 MG TABS Take 1 tablet (1 mg total) by mouth daily. 30 tablet 11  . SYRINGE-NEEDLE, DISP, 3 ML (BD ECLIPSE SYRINGE) 23G X 1" 3 ML MISC Use IM as directed 50 each 2  . terazosin (HYTRIN) 5 MG capsule TAKE 1 CAPSULE BY MOUTH AT BEDTIME 90 capsule 3  . testosterone cypionate (DEPOTESTOSTERONE CYPIONATE) 200 MG/ML injection Inject 1 mL (200 mg total) into the muscle every 14 (fourteen) days. 10 mL 5  . Turmeric Curcumin 500 MG CAPS Take by mouth 1 day or 1 dose.    . sildenafil (VIAGRA) 100 MG tablet Take 1 tablet (100 mg total) by mouth as needed for erectile dysfunction. 30 tablet 5   No facility-administered medications prior to visit.     ROS: Review of Systems  Constitutional: Negative for appetite change, fatigue and unexpected weight change.  HENT: Negative for congestion, nosebleeds, sneezing, sore throat and trouble swallowing.   Eyes: Negative for itching and  visual disturbance.  Respiratory: Negative for cough.   Cardiovascular: Negative for chest pain, palpitations and leg swelling.  Gastrointestinal: Negative for abdominal distention, blood in stool, diarrhea and nausea.  Genitourinary: Negative for frequency and hematuria.  Musculoskeletal: Negative for back pain, gait problem, joint swelling and neck pain.  Skin: Negative for rash.  Neurological: Negative for dizziness, tremors, speech difficulty and weakness.  Psychiatric/Behavioral: Negative for agitation, dysphoric mood and sleep disturbance. The patient is not nervous/anxious.     Objective:  BP 140/78 (BP Location: Left Arm, Patient Position: Sitting, Cuff Size: Large)   Pulse 67   Temp 97.7 F (36.5 C) (Oral)   Ht 5\' 9"  (1.753 m)   Wt 220 lb (99.8 kg)   SpO2 96%   BMI 32.49 kg/m   BP Readings from Last 3 Encounters:  07/05/18 140/78  02/27/18 132/76  11/23/17 130/74    Wt Readings from Last 3 Encounters:  07/05/18 220 lb (99.8 kg)  02/27/18 216 lb (98 kg)  11/23/17 210 lb (95.3 kg)    Physical Exam Constitutional:      General: He is not in acute distress.    Appearance: He is well-developed.     Comments: NAD  Eyes:     Conjunctiva/sclera: Conjunctivae normal.     Pupils: Pupils are equal, round, and reactive to light.  Neck:     Musculoskeletal: Normal range of motion.     Thyroid: No thyromegaly.     Vascular: No JVD.  Cardiovascular:     Rate and Rhythm: Normal rate and regular rhythm.     Heart sounds: Normal heart sounds. No murmur. No friction rub. No gallop.   Pulmonary:     Effort: Pulmonary effort is normal. No respiratory distress.     Breath sounds: Normal breath sounds. No wheezing or rales.  Chest:     Chest wall: No tenderness.  Abdominal:     General: Bowel sounds are normal. There is no distension.     Palpations: Abdomen is soft. There is no mass.     Tenderness: There is no abdominal tenderness. There is no guarding or rebound.    Musculoskeletal: Normal range of motion.        General: No tenderness.  Lymphadenopathy:     Cervical: No cervical adenopathy.  Skin:    General: Skin is warm and dry.     Findings: No rash.  Neurological:     Mental Status: He is alert and oriented to person, place, and time.     Cranial Nerves: No cranial nerve deficit.     Motor: No abnormal muscle tone.     Coordination: Coordination normal.     Gait: Gait normal.     Deep Tendon Reflexes: Reflexes are normal and symmetric.  Psychiatric:        Behavior: Behavior normal.        Thought Content: Thought content normal.        Judgment: Judgment normal.     Lab Results  Component Value Date   WBC 7.0 07/03/2018   HGB 16.9 07/03/2018   HCT 50.1 07/03/2018   PLT 244.0 07/03/2018   GLUCOSE 95 07/03/2018   CHOL 212 (H) 07/03/2018   TRIG 160.0 (H) 07/03/2018   HDL 41.50 07/03/2018   LDLDIRECT 117.0 09/04/2015   LDLCALC 139 (H) 07/03/2018   ALT 15 07/03/2018   AST 18 07/03/2018   NA 142 07/03/2018   K 5.5 (H) 07/03/2018   CL 106 07/03/2018   CREATININE 1.62 (H) 07/03/2018   BUN 15 07/03/2018   CO2 26 07/03/2018   TSH 0.04 (L) 07/03/2018   PSA 3.74 07/03/2018   INR 1.00 10/04/2011   HGBA1C 5.7 07/03/2018    Mr Abdomen W Wo Contrast  Result Date: 03/22/2018 CLINICAL DATA:  Renal mass, renal cyst on ultrasound. Indeterminate lesions. MRI recommended for further evaluation EXAM: MRI ABDOMEN WITHOUT AND WITH CONTRAST TECHNIQUE: Multiplanar multisequence MR imaging of the abdomen was performed both before and after the administration of intravenous contrast. CONTRAST:  66mL MULTIHANCE GADOBENATE DIMEGLUMINE 529 MG/ML IV SOLN COMPARISON:  Ultrasound 03/03/2018 FINDINGS: Lower chest: Lung bases are clear. Hepatobiliary: No focal hepatic lesion. No biliary duct dilatation. Gallbladder is normal. Common bile duct is normal. Pancreas: Pancreas is normal. No ductal dilatation. No pancreatic inflammation. Spleen: Normal spleen  Adrenals/urinary tract: Adrenal glands normal. There bilateral renal cysts which have high signal intensity on T2 weighted imaging typical of benign renal cysts. Particular attention directed to the RIGHT upper pole. The some of the cysts are bilobed which area a thin intervening cyst wall. There is no specific nodularity the cysts. Postcontrast imaging demonstrates. No post-contrast enhancement of the bilateral cysts. Stomach/Bowel: The stomach and limited view of the bowel is unremarkable Vascular/Lymphatic: Abdominal aorta is normal caliber. No periportal or retroperitoneal adenopathy. Other: No free fluid. Musculoskeletal: No  aggressive osseous lesion. Hyperemia about in osteophyte complex at L2-L3 (image 78/16). This osteophyte complex to corresponds to lumbar plain film 04/14/2007. IMPRESSION: 1. Bilateral Bosniak 1 benign renal cysts. 2. Mild inflammation associated with a L2-L3 osteophyte complex. Electronically Signed   By: Suzy Bouchard M.D.   On: 03/22/2018 08:34    Assessment & Plan:   There are no diagnoses linked to this encounter.   No orders of the defined types were placed in this encounter.    Follow-up: No follow-ups on file.  Walker Kehr, MD

## 2018-08-30 DIAGNOSIS — Z20828 Contact with and (suspected) exposure to other viral communicable diseases: Secondary | ICD-10-CM | POA: Diagnosis not present

## 2018-08-30 DIAGNOSIS — R509 Fever, unspecified: Secondary | ICD-10-CM | POA: Diagnosis not present

## 2018-08-30 DIAGNOSIS — R5383 Other fatigue: Secondary | ICD-10-CM | POA: Diagnosis not present

## 2018-08-30 DIAGNOSIS — R5381 Other malaise: Secondary | ICD-10-CM | POA: Diagnosis not present

## 2018-09-05 ENCOUNTER — Other Ambulatory Visit: Payer: Self-pay | Admitting: Internal Medicine

## 2018-09-13 ENCOUNTER — Other Ambulatory Visit: Payer: Self-pay | Admitting: Internal Medicine

## 2018-09-18 ENCOUNTER — Ambulatory Visit (INDEPENDENT_AMBULATORY_CARE_PROVIDER_SITE_OTHER): Payer: Medicare Other | Admitting: Internal Medicine

## 2018-09-18 ENCOUNTER — Encounter: Payer: Self-pay | Admitting: Internal Medicine

## 2018-09-18 DIAGNOSIS — G8929 Other chronic pain: Secondary | ICD-10-CM

## 2018-09-18 DIAGNOSIS — M545 Low back pain, unspecified: Secondary | ICD-10-CM

## 2018-09-18 NOTE — Assessment & Plan Note (Signed)
Sounds like this could be related to some constipation and gas. He is advised to increase fluids, fiber and consider gas-x. Call back for lack of improvement or worsening.

## 2018-09-18 NOTE — Progress Notes (Signed)
Virtual Visit via Video Note  I connected with Donald Phillips. on 09/18/18 at  2:00 PM EDT by an audio-only enabled telemedicine application and verified that I am speaking with the correct person using two identifiers.  The patient and the provider were at separate locations throughout the entire encounter.   I discussed the limitations of evaluation and management by telemedicine and the availability of in person appointments. The patient expressed understanding and agreed to proceed.  History of Present Illness: The patient is a 73 y.o. man with visit for back pain starting last Tuesday. He tried walking around and pacing. This got better later in the day. He was also having a lot of gas around the same time with stomach bloating. The next day similar thing happened. Then he had 2-3 days without the pain. This morning he had some similar symptoms but pain not as severe. Has no fevers or chills. No low back pain currently. Some irregular bowel movements and some straining at times. Denies this being unusual for him. Denies drinking enough water. Overall it is stable. Has tried nothing for pain except walking and passing gas which helped.  Observations/Objective: Voice clear and strong  Assessment and Plan: See problem oriented charting  Follow Up Instructions: monitor symptoms, increase water intake, increase fiber intake, consider gas-x with meals  I discussed the assessment and treatment plan with the patient. The patient was provided an opportunity to ask questions and all were answered. The patient agreed with the plan and demonstrated an understanding of the instructions.   The patient was advised to call back or seek an in-person evaluation if the symptoms worsen or if the condition fails to improve as anticipated.  Visit time 9 minutes: 100% of that time was spent in audio communication about counseling and coordination of care with the patient: counseled about symptoms and likely  etiology as well as treatment steps  Donald Koch, MD

## 2018-11-13 DIAGNOSIS — Z85828 Personal history of other malignant neoplasm of skin: Secondary | ICD-10-CM | POA: Diagnosis not present

## 2018-11-13 DIAGNOSIS — L579 Skin changes due to chronic exposure to nonionizing radiation, unspecified: Secondary | ICD-10-CM | POA: Diagnosis not present

## 2018-11-13 DIAGNOSIS — L814 Other melanin hyperpigmentation: Secondary | ICD-10-CM | POA: Diagnosis not present

## 2018-11-13 DIAGNOSIS — L570 Actinic keratosis: Secondary | ICD-10-CM | POA: Diagnosis not present

## 2018-11-13 DIAGNOSIS — Z85820 Personal history of malignant melanoma of skin: Secondary | ICD-10-CM | POA: Diagnosis not present

## 2018-11-13 DIAGNOSIS — L821 Other seborrheic keratosis: Secondary | ICD-10-CM | POA: Diagnosis not present

## 2018-12-09 ENCOUNTER — Other Ambulatory Visit: Payer: Self-pay | Admitting: Internal Medicine

## 2018-12-22 ENCOUNTER — Other Ambulatory Visit: Payer: Self-pay | Admitting: Internal Medicine

## 2018-12-25 ENCOUNTER — Other Ambulatory Visit (INDEPENDENT_AMBULATORY_CARE_PROVIDER_SITE_OTHER): Payer: Medicare Other

## 2018-12-25 DIAGNOSIS — I10 Essential (primary) hypertension: Secondary | ICD-10-CM | POA: Diagnosis not present

## 2018-12-25 DIAGNOSIS — N320 Bladder-neck obstruction: Secondary | ICD-10-CM | POA: Diagnosis not present

## 2018-12-25 DIAGNOSIS — E785 Hyperlipidemia, unspecified: Secondary | ICD-10-CM

## 2018-12-25 LAB — HEPATIC FUNCTION PANEL
ALT: 11 U/L (ref 0–53)
AST: 12 U/L (ref 0–37)
Albumin: 4.2 g/dL (ref 3.5–5.2)
Alkaline Phosphatase: 49 U/L (ref 39–117)
Bilirubin, Direct: 0.1 mg/dL (ref 0.0–0.3)
Total Bilirubin: 0.5 mg/dL (ref 0.2–1.2)
Total Protein: 6.9 g/dL (ref 6.0–8.3)

## 2018-12-25 LAB — CBC WITH DIFFERENTIAL/PLATELET
Basophils Absolute: 0.1 10*3/uL (ref 0.0–0.1)
Basophils Relative: 1.1 % (ref 0.0–3.0)
Eosinophils Absolute: 0.3 10*3/uL (ref 0.0–0.7)
Eosinophils Relative: 4.7 % (ref 0.0–5.0)
HCT: 46.9 % (ref 39.0–52.0)
Hemoglobin: 15.6 g/dL (ref 13.0–17.0)
Lymphocytes Relative: 28.9 % (ref 12.0–46.0)
Lymphs Abs: 2 10*3/uL (ref 0.7–4.0)
MCHC: 33.2 g/dL (ref 30.0–36.0)
MCV: 97.2 fl (ref 78.0–100.0)
Monocytes Absolute: 0.5 10*3/uL (ref 0.1–1.0)
Monocytes Relative: 6.9 % (ref 3.0–12.0)
Neutro Abs: 4 10*3/uL (ref 1.4–7.7)
Neutrophils Relative %: 58.4 % (ref 43.0–77.0)
Platelets: 279 10*3/uL (ref 150.0–400.0)
RBC: 4.83 Mil/uL (ref 4.22–5.81)
RDW: 14 % (ref 11.5–15.5)
WBC: 6.8 10*3/uL (ref 4.0–10.5)

## 2018-12-25 LAB — BASIC METABOLIC PANEL
BUN: 23 mg/dL (ref 6–23)
CO2: 27 mEq/L (ref 19–32)
Calcium: 9.2 mg/dL (ref 8.4–10.5)
Chloride: 107 mEq/L (ref 96–112)
Creatinine, Ser: 1.83 mg/dL — ABNORMAL HIGH (ref 0.40–1.50)
GFR: 36.58 mL/min — ABNORMAL LOW (ref >60.00)
Glucose, Bld: 97 mg/dL (ref 70–99)
Potassium: 4.9 mEq/L (ref 3.5–5.1)
Sodium: 142 mEq/L (ref 135–145)

## 2018-12-25 LAB — TSH: TSH: 0.16 u[IU]/mL — ABNORMAL LOW (ref 0.35–4.50)

## 2018-12-25 LAB — LIPID PANEL
Cholesterol: 176 mg/dL (ref 0–200)
HDL: 40.9 mg/dL (ref >39.00)
LDL Cholesterol: 110 mg/dL — ABNORMAL HIGH (ref 0–99)
NonHDL: 135.57
Total CHOL/HDL Ratio: 4
Triglycerides: 128 mg/dL (ref 0.0–149.0)
VLDL: 25.6 mg/dL (ref 0.0–40.0)

## 2018-12-25 LAB — T3, FREE: T3, Free: 2.8 pg/mL (ref 2.3–4.2)

## 2018-12-25 LAB — PSA: PSA: 3.74 ng/mL (ref 0.10–4.00)

## 2018-12-25 LAB — T4, FREE: Free T4: 1.29 ng/dL (ref 0.60–1.60)

## 2018-12-25 LAB — TESTOSTERONE: Testosterone: 389.05 ng/dL (ref 300.00–890.00)

## 2019-01-03 ENCOUNTER — Other Ambulatory Visit: Payer: Self-pay

## 2019-01-03 ENCOUNTER — Encounter: Payer: Self-pay | Admitting: Internal Medicine

## 2019-01-03 ENCOUNTER — Ambulatory Visit (INDEPENDENT_AMBULATORY_CARE_PROVIDER_SITE_OTHER): Payer: Medicare Other | Admitting: Internal Medicine

## 2019-01-03 VITALS — BP 140/76 | HR 64 | Temp 98.4°F | Ht 69.0 in | Wt 206.0 lb

## 2019-01-03 DIAGNOSIS — E538 Deficiency of other specified B group vitamins: Secondary | ICD-10-CM | POA: Diagnosis not present

## 2019-01-03 DIAGNOSIS — D751 Secondary polycythemia: Secondary | ICD-10-CM

## 2019-01-03 DIAGNOSIS — N183 Chronic kidney disease, stage 3 unspecified: Secondary | ICD-10-CM

## 2019-01-03 DIAGNOSIS — Z23 Encounter for immunization: Secondary | ICD-10-CM | POA: Diagnosis not present

## 2019-01-03 DIAGNOSIS — I10 Essential (primary) hypertension: Secondary | ICD-10-CM | POA: Diagnosis not present

## 2019-01-03 DIAGNOSIS — E559 Vitamin D deficiency, unspecified: Secondary | ICD-10-CM

## 2019-01-03 NOTE — Progress Notes (Signed)
Subjective:  Patient ID: Donald Fillers., male    DOB: March 27, 1946  Age: 73 y.o. MRN: [id number]  CC: No chief complaint on file.   HPI Donald Fillers. presents for hypothyroidism, hypogonadism, B12 def. Pt stopped Amlodipine due to side effects  Outpatient Medications Prior to Visit  Medication Sig Dispense Refill  . Biotin 5000 MCG TABS Take 1 tablet by mouth daily.    Marland Kitchen buPROPion (WELLBUTRIN XL) 300 MG 24 hr tablet TAKE 1 TABLET BY MOUTH ONCE DAILY 90 tablet 1  . Cholecalciferol (VITAMIN D3) 1000 UNITS tablet Take 1,000 Units by mouth daily.      . Cyanocobalamin (VITAMIN B-12) 1000 MCG SUBL Place 1 tablet (1,000 mcg total) under the tongue daily. 100 tablet 3  . fenofibrate (TRICOR) 145 MG tablet Take 1 tablet by mouth once daily 90 tablet 0  . levothyroxine (SYNTHROID) 150 MCG tablet Take 1 tablet by mouth once daily 90 tablet 3  . NEEDLE, DISP, 18 G 18G X 1" MISC Use to draw up testosterone injection 50 each 0  . Pitavastatin Calcium (LIVALO) 1 MG TABS Take 1 tablet (1 mg total) by mouth daily. 30 tablet 11  . sildenafil (VIAGRA) 100 MG tablet Take 1 tablet (100 mg total) by mouth as needed for erectile dysfunction. 30 tablet 5  . SYRINGE-NEEDLE, DISP, 3 ML (BD ECLIPSE SYRINGE) 23G X 1" 3 ML MISC Use IM as directed 50 each 2  . terazosin (HYTRIN) 5 MG capsule Take 1 capsule by mouth at bedtime 90 capsule 3  . testosterone cypionate (DEPOTESTOSTERONE CYPIONATE) 200 MG/ML injection INJECT 1 ML (CC) INTRAMUSCULARLY EVERY TWO WEEKS (14 DAYS). 10 mL 3  . Turmeric Curcumin 500 MG CAPS Take by mouth 1 day or 1 dose.    Marland Kitchen amLODipine (NORVASC) 5 MG tablet TAKE 1 TABLET BY MOUTH ONCE DAILY (Patient not taking: Reported on 01/03/2019) 90 tablet 1   No facility-administered medications prior to visit.     ROS: Review of Systems  Constitutional: Negative for appetite change, fatigue and unexpected weight change.  HENT: Negative for congestion, nosebleeds, sneezing, sore throat and  trouble swallowing.   Eyes: Negative for itching and visual disturbance.  Respiratory: Negative for cough.   Cardiovascular: Negative for chest pain, palpitations and leg swelling.  Gastrointestinal: Negative for abdominal distention, blood in stool, diarrhea and nausea.  Genitourinary: Negative for frequency and hematuria.  Musculoskeletal: Negative for back pain, gait problem, joint swelling and neck pain.  Skin: Negative for rash.  Neurological: Negative for dizziness, tremors, speech difficulty and weakness.  Psychiatric/Behavioral: Negative for agitation, dysphoric mood, sleep disturbance and suicidal ideas. The patient is not nervous/anxious.     Objective:  BP 140/76 (BP Location: Left Arm, Patient Position: Sitting, Cuff Size: Large)   Pulse 64   Temp 98.4 F (36.9 C) (Oral)   Ht 5\' 9"  (1.753 m)   Wt 206 lb (93.4 kg)   SpO2 96%   BMI 30.42 kg/m   BP Readings from Last 3 Encounters:  01/03/19 140/76  07/05/18 140/78  02/27/18 132/76    Wt Readings from Last 3 Encounters:  01/03/19 206 lb (93.4 kg)  07/05/18 220 lb (99.8 kg)  02/27/18 216 lb (98 kg)    Physical Exam Constitutional:      General: He is not in acute distress.    Appearance: He is well-developed.     Comments: NAD  Eyes:     Conjunctiva/sclera: Conjunctivae normal.     Pupils:  Pupils are equal, round, and reactive to light.  Neck:     Musculoskeletal: Normal range of motion.     Thyroid: No thyromegaly.     Vascular: No JVD.  Cardiovascular:     Rate and Rhythm: Normal rate and regular rhythm.     Heart sounds: Normal heart sounds. No murmur. No friction rub. No gallop.   Pulmonary:     Effort: Pulmonary effort is normal. No respiratory distress.     Breath sounds: Normal breath sounds. No wheezing or rales.  Chest:     Chest wall: No tenderness.  Abdominal:     General: Bowel sounds are normal. There is no distension.     Palpations: Abdomen is soft. There is no mass.     Tenderness:  There is no abdominal tenderness. There is no guarding or rebound.  Musculoskeletal: Normal range of motion.        General: No tenderness.  Lymphadenopathy:     Cervical: No cervical adenopathy.  Skin:    General: Skin is warm and dry.     Findings: No rash.  Neurological:     Mental Status: He is alert and oriented to person, place, and time.     Cranial Nerves: No cranial nerve deficit.     Motor: No abnormal muscle tone.     Coordination: Coordination normal.     Gait: Gait normal.     Deep Tendon Reflexes: Reflexes are normal and symmetric.  Psychiatric:        Behavior: Behavior normal.        Thought Content: Thought content normal.        Judgment: Judgment normal.     Lab Results  Component Value Date   WBC 6.8 12/25/2018   HGB 15.6 12/25/2018   HCT 46.9 12/25/2018   PLT 279.0 12/25/2018   GLUCOSE 97 12/25/2018   CHOL 176 12/25/2018   TRIG 128.0 12/25/2018   HDL 40.90 12/25/2018   LDLDIRECT 117.0 09/04/2015   LDLCALC 110 (H) 12/25/2018   ALT 11 12/25/2018   AST 12 12/25/2018   NA 142 12/25/2018   K 4.9 12/25/2018   CL 107 12/25/2018   CREATININE 1.83 (H) 12/25/2018   BUN 23 12/25/2018   CO2 27 12/25/2018   TSH 0.16 (L) 12/25/2018   PSA 3.74 12/25/2018   INR 1.00 10/04/2011   HGBA1C 5.7 07/03/2018    Mr Abdomen W Wo Contrast  Result Date: 03/22/2018 CLINICAL DATA:  Renal mass, renal cyst on ultrasound. Indeterminate lesions. MRI recommended for further evaluation EXAM: MRI ABDOMEN WITHOUT AND WITH CONTRAST TECHNIQUE: Multiplanar multisequence MR imaging of the abdomen was performed both before and after the administration of intravenous contrast. CONTRAST:  73mL MULTIHANCE GADOBENATE DIMEGLUMINE 529 MG/ML IV SOLN COMPARISON:  Ultrasound 03/03/2018 FINDINGS: Lower chest: Lung bases are clear. Hepatobiliary: No focal hepatic lesion. No biliary duct dilatation. Gallbladder is normal. Common bile duct is normal. Pancreas: Pancreas is normal. No ductal  dilatation. No pancreatic inflammation. Spleen: Normal spleen Adrenals/urinary tract: Adrenal glands normal. There bilateral renal cysts which have high signal intensity on T2 weighted imaging typical of benign renal cysts. Particular attention directed to the RIGHT upper pole. The some of the cysts are bilobed which area a thin intervening cyst wall. There is no specific nodularity the cysts. Postcontrast imaging demonstrates. No post-contrast enhancement of the bilateral cysts. Stomach/Bowel: The stomach and limited view of the bowel is unremarkable Vascular/Lymphatic: Abdominal aorta is normal caliber. No periportal or retroperitoneal adenopathy. Other:  No free fluid. Musculoskeletal: No aggressive osseous lesion. Hyperemia about in osteophyte complex at L2-L3 (image 78/16). This osteophyte complex to corresponds to lumbar plain film 04/14/2007. IMPRESSION: 1. Bilateral Bosniak 1 benign renal cysts. 2. Mild inflammation associated with a L2-L3 osteophyte complex. Electronically Signed   By: Suzy Bouchard M.D.   On: 03/22/2018 08:34    Assessment & Plan:   Diagnoses and all orders for this visit:  Need for influenza vaccination -     Flu Vaccine QUAD High Dose(Fluad)     No orders of the defined types were placed in this encounter.    Follow-up: No follow-ups on file.  Walker Kehr, MD

## 2019-01-03 NOTE — Assessment & Plan Note (Addendum)
Worse Nephrol ref Labs

## 2019-01-03 NOTE — Assessment & Plan Note (Signed)
On B12 

## 2019-01-03 NOTE — Assessment & Plan Note (Addendum)
CBC - watching

## 2019-01-03 NOTE — Assessment & Plan Note (Signed)
Vit D 

## 2019-01-03 NOTE — Assessment & Plan Note (Addendum)
Hytrin,  Norvasc d/c

## 2019-01-03 NOTE — Patient Instructions (Addendum)
Wt Readings from Last 3 Encounters:  01/03/19 206 lb (93.4 kg)  07/05/18 220 lb (99.8 kg)  02/27/18 216 lb (98 kg)     These suggestions will probably help you to improve your metabolism if you are not overweight and to lose weight if you are overweight: 1.  Reduce your consumption of sugars and starches.  Eliminate high fructose corn syrup from your diet.  Reduce your consumption of processed foods.  For desserts try to have seasonal fruits, berries, nuts, cheeses or dark chocolate with more than 70% cacao. 2.  Do not snack 3.  You do not have to eat breakfast.  If you choose to have breakfast-eat plain greek yogurt, eggs, oatmeal (without sugar) 4.  Drink water, freshly brewed unsweetened tea (green, black or herbal) or coffee.  Do not drink sodas including diet sodas , juices, beverages sweetened with artificial sweeteners. 5.  Reduce your consumption of refined grains. 6.  Avoid protein drinks such as Optifast, Slim fast etc. Eat chicken, fish, meat, dairy and beans for your sources of protein 7.  Natural unprocessed fats like cold pressed virgin olive oil, butter, coconut oil are good for you.  Eat avocados 8.  Increase your consumption of fiber.  Fruits, berries, vegetables, whole grains, flaxseeds, Chia seeds, beans, popcorn, nuts, oatmeal are good sources of fiber 9.  Use vinegar in your diet, i.e. apple cider vinegar, red wine or balsamic vinegar 10.  You can try fasting.  For example you can skip breakfast and lunch every other day (24-hour fast) 11.  Stress reduction, good night sleep, relaxation, meditation, yoga and other physical activity is likely to help you to maintain low weight too. 12.  If you drink alcohol, limit your alcohol intake to no more than 2 drinks a day.   Mediterranean diet is good for you. ([employer] has a typical Mediterranean cuisine menu) The Mediterranean diet is a way of eating based on the traditional cuisine of countries bordering the [place]. While there is no single definition of the Mediterranean diet, it is typically high in vegetables, fruits, whole grains, beans, nut and seeds, and olive oil. The main components of Mediterranean diet include: Marland Kitchen Daily consumption of vegetables, fruits, whole grains and healthy fats  . Weekly intake of fish, poultry, beans and eggs  . Moderate portions of dairy products  . Limited intake of red meat Other important elements of the Mediterranean diet are sharing meals with family and friends, enjoying a glass of red wine and being physically active. Health benefits of a Mediterranean diet: A traditional Mediterranean diet consisting of large quantities of fresh fruits and vegetables, nuts, fish and olive oil-coupled with physical activity-can reduce your risk of serious mental and physical health problems by: Preventing heart disease and strokes. Following a Mediterranean diet limits your intake of refined breads, processed foods, and red meat, and encourages drinking red wine instead of hard liquor-all factors that can help prevent heart disease and stroke. Keeping you agile. If you're an older adult, the nutrients gained with a Mediterranean diet may reduce your risk of developing muscle weakness and other signs of frailty by about 70 percent. Reducing the risk of Alzheimer's. Research suggests that the [place] diet may improve cholesterol, blood sugar levels, and overall blood vessel health, which in turn may reduce your risk of Alzheimer's disease or dementia. Halving the risk of Parkinson's disease. The high levels of antioxidants in the Mediterranean diet can prevent cells from undergoing a damaging process  called oxidative stress, thereby cutting the risk of Parkinson's disease in half. Increasing longevity. By reducing your risk of developing heart disease or cancer with the Mediterranean diet, you're reducing your risk of death at any age by 20%. Protecting against type 2 diabetes. A  Mediterranean diet is rich in fiber which digests slowly, prevents huge swings in blood sugar, and can help you maintain a healthy weight.    Cabbage soup recipe that will not make you gain weight: Take 1 small head of cabbage, 1 average pack of celery, 4 green peppers, 4 onions, 2 cans diced tomatoes (they are not available without salt), salt and spices to taste.  Chop cabbage, celery, peppers and onions.  And tomatoes and 2-2.5 liters (2.5 quarts) of water so that it would just cover the vegetables.  Bring to boil.  Add spices and salt.  Turn heat to low/medium and simmer for 20-25 minutes.  Naturally, you can make a smaller batch and change some of the ingredients.  Cardiac CT calcium scoring test $150   Computed tomography, more commonly known as a CT or CAT scan, is a diagnostic medical imaging test. Like traditional x-rays, it produces multiple images or pictures of the inside of the body. The cross-sectional images generated during a CT scan can be reformatted in multiple planes. They can even generate three-dimensional images. These images can be viewed on a computer monitor, printed on film or by a 3D printer, or transferred to a CD or DVD. CT images of internal organs, bones, soft tissue and blood vessels provide greater detail than traditional x-rays, particularly of soft tissues and blood vessels. A cardiac CT scan for coronary calcium is a non-invasive way of obtaining information about the presence, location and extent of calcified plaque in the coronary arteries-the vessels that supply oxygen-containing blood to the heart muscle. Calcified plaque results when there is a build-up of fat and other substances under the inner layer of the artery. This material can calcify which signals the presence of atherosclerosis, a disease of the vessel wall, also called coronary artery disease (CAD). People with this disease have an increased risk for heart attacks. In addition, over time, progression  of plaque build up (CAD) can narrow the arteries or even close off blood flow to the heart. The result may be chest pain, sometimes called "angina," or a heart attack. Because calcium is a marker of CAD, the amount of calcium detected on a cardiac CT scan is a helpful prognostic tool. The findings on cardiac CT are expressed as a calcium score. Another name for this test is coronary artery calcium scoring.  What are some common uses of the procedure? The goal of cardiac CT scan for calcium scoring is to determine if CAD is present and to what extent, even if there are no symptoms. It is a screening study that may be recommended by a physician for patients with risk factors for CAD but no clinical symptoms. The major risk factors for CAD are: . high blood cholesterol levels  . family history of heart attacks  . diabetes  . high blood pressure  . cigarette smoking  . overweight or obese  . physical inactivity   A negative cardiac CT scan for calcium scoring shows no calcification within the coronary arteries. This suggests that CAD is absent or so minimal it cannot be seen by this technique. The chance of having a heart attack over the next two to five years is very low under these circumstances.  A positive test means that CAD is present, regardless of whether or not the patient is experiencing any symptoms. The amount of calcification-expressed as the calcium score-may help to predict the likelihood of a myocardial infarction (heart attack) in the coming years and helps your medical doctor or cardiologist decide whether the patient may need to take preventive medicine or undertake other measures such as diet and exercise to lower the risk for heart attack. The extent of CAD is graded according to your calcium score:  Calcium Score  Presence of CAD (coronary artery disease)  0 No evidence of CAD   1-10 Minimal evidence of CAD  11-100 Mild evidence of CAD  101-400 Moderate evidence of CAD  Over  400 Extensive evidence of CAD

## 2019-01-11 DIAGNOSIS — Z85828 Personal history of other malignant neoplasm of skin: Secondary | ICD-10-CM | POA: Diagnosis not present

## 2019-01-11 DIAGNOSIS — Z85820 Personal history of malignant melanoma of skin: Secondary | ICD-10-CM | POA: Diagnosis not present

## 2019-01-11 DIAGNOSIS — D485 Neoplasm of uncertain behavior of skin: Secondary | ICD-10-CM | POA: Diagnosis not present

## 2019-01-11 DIAGNOSIS — L821 Other seborrheic keratosis: Secondary | ICD-10-CM | POA: Diagnosis not present

## 2019-01-20 ENCOUNTER — Other Ambulatory Visit: Payer: Self-pay | Admitting: Internal Medicine

## 2019-01-22 DIAGNOSIS — H6122 Impacted cerumen, left ear: Secondary | ICD-10-CM | POA: Diagnosis not present

## 2019-01-22 DIAGNOSIS — H6063 Unspecified chronic otitis externa, bilateral: Secondary | ICD-10-CM | POA: Diagnosis not present

## 2019-01-30 DIAGNOSIS — B351 Tinea unguium: Secondary | ICD-10-CM | POA: Diagnosis not present

## 2019-01-30 DIAGNOSIS — L600 Ingrowing nail: Secondary | ICD-10-CM | POA: Diagnosis not present

## 2019-02-19 ENCOUNTER — Other Ambulatory Visit: Payer: Self-pay

## 2019-02-19 ENCOUNTER — Ambulatory Visit (INDEPENDENT_AMBULATORY_CARE_PROVIDER_SITE_OTHER)
Admission: RE | Admit: 2019-02-19 | Discharge: 2019-02-19 | Disposition: A | Discharge status: Home / Self Care | Payer: Self-pay | Source: Ambulatory Visit | Attending: Internal Medicine | Admitting: Internal Medicine

## 2019-02-19 DIAGNOSIS — I10 Essential (primary) hypertension: Secondary | ICD-10-CM

## 2019-02-23 ENCOUNTER — Other Ambulatory Visit: Payer: Self-pay | Admitting: Internal Medicine

## 2019-02-23 MED ORDER — ASPIRIN EC 81 MG PO TBEC: 81.0000 mg | DELAYED_RELEASE_TABLET | Freq: Every day | ORAL | 3 refills | Status: AC

## 2019-03-28 ENCOUNTER — Other Ambulatory Visit: Payer: Self-pay | Admitting: Internal Medicine

## 2019-05-31 DIAGNOSIS — L821 Other seborrheic keratosis: Secondary | ICD-10-CM | POA: Diagnosis not present

## 2019-05-31 DIAGNOSIS — L814 Other melanin hyperpigmentation: Secondary | ICD-10-CM | POA: Diagnosis not present

## 2019-05-31 DIAGNOSIS — L579 Skin changes due to chronic exposure to nonionizing radiation, unspecified: Secondary | ICD-10-CM | POA: Diagnosis not present

## 2019-05-31 DIAGNOSIS — Z85828 Personal history of other malignant neoplasm of skin: Secondary | ICD-10-CM | POA: Diagnosis not present

## 2019-05-31 DIAGNOSIS — D1801 Hemangioma of skin and subcutaneous tissue: Secondary | ICD-10-CM | POA: Diagnosis not present

## 2019-05-31 DIAGNOSIS — D225 Melanocytic nevi of trunk: Secondary | ICD-10-CM | POA: Diagnosis not present

## 2019-05-31 DIAGNOSIS — L570 Actinic keratosis: Secondary | ICD-10-CM | POA: Diagnosis not present

## 2019-05-31 DIAGNOSIS — Z85820 Personal history of malignant melanoma of skin: Secondary | ICD-10-CM | POA: Diagnosis not present

## 2019-06-01 ENCOUNTER — Telehealth: Payer: Self-pay

## 2019-06-01 NOTE — Telephone Encounter (Signed)
New message   The patient is unaware of a referral to [facility] asking for a call back.   Please advise.

## 2019-06-04 DIAGNOSIS — H47323 Drusen of optic disc, bilateral: Secondary | ICD-10-CM | POA: Diagnosis not present

## 2019-06-04 DIAGNOSIS — H2513 Age-related nuclear cataract, bilateral: Secondary | ICD-10-CM | POA: Diagnosis not present

## 2019-06-05 NOTE — Telephone Encounter (Signed)
See TE.

## 2019-06-07 DIAGNOSIS — R7309 Other abnormal glucose: Secondary | ICD-10-CM | POA: Diagnosis not present

## 2019-06-07 DIAGNOSIS — E785 Hyperlipidemia, unspecified: Secondary | ICD-10-CM | POA: Diagnosis not present

## 2019-06-07 DIAGNOSIS — E039 Hypothyroidism, unspecified: Secondary | ICD-10-CM | POA: Diagnosis not present

## 2019-06-07 DIAGNOSIS — N1832 Chronic kidney disease, stage 3b: Secondary | ICD-10-CM | POA: Diagnosis not present

## 2019-06-07 DIAGNOSIS — E1122 Type 2 diabetes mellitus with diabetic chronic kidney disease: Secondary | ICD-10-CM | POA: Diagnosis not present

## 2019-06-13 ENCOUNTER — Other Ambulatory Visit: Payer: Self-pay | Admitting: Nephrology

## 2019-06-13 DIAGNOSIS — N1832 Chronic kidney disease, stage 3b: Secondary | ICD-10-CM

## 2019-06-15 ENCOUNTER — Ambulatory Visit
Admission: RE | Admit: 2019-06-15 | Discharge: 2019-06-15 | Disposition: A | Discharge status: Home / Self Care | Payer: Medicare Other | Source: Ambulatory Visit | Attending: Nephrology | Admitting: Nephrology

## 2019-06-15 DIAGNOSIS — N1832 Chronic kidney disease, stage 3b: Secondary | ICD-10-CM | POA: Diagnosis not present

## 2019-07-05 ENCOUNTER — Ambulatory Visit: Payer: Medicare Other | Admitting: Internal Medicine

## 2019-07-09 ENCOUNTER — Other Ambulatory Visit: Payer: Self-pay | Admitting: Internal Medicine

## 2019-07-09 ENCOUNTER — Ambulatory Visit (INDEPENDENT_AMBULATORY_CARE_PROVIDER_SITE_OTHER): Payer: Medicare Other | Admitting: Internal Medicine

## 2019-07-09 ENCOUNTER — Encounter: Payer: Self-pay | Admitting: Internal Medicine

## 2019-07-09 ENCOUNTER — Other Ambulatory Visit: Payer: Self-pay

## 2019-07-09 DIAGNOSIS — I10 Essential (primary) hypertension: Secondary | ICD-10-CM

## 2019-07-09 DIAGNOSIS — I2510 Atherosclerotic heart disease of native coronary artery without angina pectoris: Secondary | ICD-10-CM

## 2019-07-09 DIAGNOSIS — E034 Atrophy of thyroid (acquired): Secondary | ICD-10-CM | POA: Diagnosis not present

## 2019-07-09 DIAGNOSIS — I2583 Coronary atherosclerosis due to lipid rich plaque: Secondary | ICD-10-CM

## 2019-07-09 DIAGNOSIS — E559 Vitamin D deficiency, unspecified: Secondary | ICD-10-CM | POA: Diagnosis not present

## 2019-07-09 DIAGNOSIS — N1831 Chronic kidney disease, stage 3a: Secondary | ICD-10-CM | POA: Diagnosis not present

## 2019-07-09 DIAGNOSIS — E538 Deficiency of other specified B group vitamins: Secondary | ICD-10-CM | POA: Diagnosis not present

## 2019-07-09 DIAGNOSIS — E291 Testicular hypofunction: Secondary | ICD-10-CM

## 2019-07-09 LAB — BASIC METABOLIC PANEL
BUN: 19 mg/dL (ref 6–23)
CO2: 24 mEq/L (ref 19–32)
Calcium: 9.5 mg/dL (ref 8.4–10.5)
Chloride: 108 mEq/L (ref 96–112)
Creatinine, Ser: 1.46 mg/dL (ref 0.40–1.50)
GFR: 47.4 mL/min — ABNORMAL LOW (ref >60.00)
Glucose, Bld: 105 mg/dL — ABNORMAL HIGH (ref 70–99)
Potassium: 5.1 mEq/L (ref 3.5–5.1)
Sodium: 142 mEq/L (ref 135–145)

## 2019-07-09 LAB — VITAMIN D 25 HYDROXY (VIT D DEFICIENCY, FRACTURES): VITD: 41.12 ng/mL (ref 30.00–100.00)

## 2019-07-09 LAB — VITAMIN B12: Vitamin B-12: 701 pg/mL (ref 211–911)

## 2019-07-09 LAB — TSH: TSH: 0.03 u[IU]/mL — ABNORMAL LOW (ref 0.35–4.50)

## 2019-07-09 LAB — T4, FREE: Free T4: 1.74 ng/dL — ABNORMAL HIGH (ref 0.60–1.60)

## 2019-07-09 MED ORDER — LEVOTHYROXINE SODIUM 150 MCG PO TABS: ORAL_TABLET | ORAL | 3 refills | Status: DC

## 2019-07-09 NOTE — Assessment & Plan Note (Signed)
On levothroid labs

## 2019-07-09 NOTE — Assessment & Plan Note (Signed)
Labs Hytrin

## 2019-07-09 NOTE — Assessment & Plan Note (Addendum)
Labs  2/21 renal US IMPRESSION: Numerous bilateral renal cysts, some of which demonstrate a small amount of internal complexity but appear benign on MR correlate. No new concerning renal lesions.  No urolithiasis or hydronephrosis. Prostatomegaly. Electronically Signed   By: Lovena Le M.D.   On: 06/15/2019 23:39

## 2019-07-09 NOTE — Assessment & Plan Note (Addendum)
Off Testosterone - stopped 3-4 mo ago

## 2019-07-09 NOTE — Assessment & Plan Note (Signed)
Vit D 

## 2019-07-09 NOTE — Assessment & Plan Note (Signed)
On B12 

## 2019-07-09 NOTE — Addendum Note (Signed)
Addended by: Cresenciano Lick on: 07/09/2019 09:25 AM   Modules accepted: Orders

## 2019-07-09 NOTE — Progress Notes (Signed)
Subjective:  Patient ID: Donald Phillips., male    DOB: Oct 07, 1945  Age: 74 y.o. MRN: [id number]  CC: No chief complaint on file.   HPI Donald Phillips. presents for HTN, CRF, CAD f/u  Outpatient Medications Prior to Visit  Medication Sig Dispense Refill  . aspirin EC 81 MG tablet Take 1 tablet (81 mg total) by mouth daily. 100 tablet 3  . Biotin 5000 MCG TABS Take 1 tablet by mouth daily.    Marland Kitchen buPROPion (WELLBUTRIN XL) 300 MG 24 hr tablet Take 1 tablet by mouth once daily 90 tablet 3  . Cholecalciferol (VITAMIN D3) 1000 UNITS tablet Take 1,000 Units by mouth daily.      . Cyanocobalamin (VITAMIN B-12) 1000 MCG SUBL Place 1 tablet (1,000 mcg total) under the tongue daily. 100 tablet 3  . fenofibrate (TRICOR) 145 MG tablet Take 1 tablet by mouth once daily 90 tablet 0  . levothyroxine (SYNTHROID) 150 MCG tablet Take 1 tablet by mouth once daily 90 tablet 3  . NEEDLE, DISP, 18 G 18G X 1" MISC Use to draw up testosterone injection 50 each 0  . Pitavastatin Calcium (LIVALO) 1 MG TABS Take 1 tablet (1 mg total) by mouth daily. 30 tablet 11  . SYRINGE-NEEDLE, DISP, 3 ML (BD ECLIPSE SYRINGE) 23G X 1" 3 ML MISC Use IM as directed 50 each 2  . terazosin (HYTRIN) 5 MG capsule Take 1 capsule by mouth at bedtime 90 capsule 3  . testosterone cypionate (DEPOTESTOSTERONE CYPIONATE) 200 MG/ML injection INJECT 1 ML (CC) INTRAMUSCULARLY EVERY TWO WEEKS (14 DAYS). 10 mL 3  . Turmeric Curcumin 500 MG CAPS Take by mouth 1 day or 1 dose.    . sildenafil (VIAGRA) 100 MG tablet Take 1 tablet (100 mg total) by mouth as needed for erectile dysfunction. 30 tablet 5   No facility-administered medications prior to visit.    ROS: Review of Systems  Constitutional: Negative for appetite change, fatigue and unexpected weight change.  HENT: Negative for congestion, nosebleeds, sneezing, sore throat and trouble swallowing.   Eyes: Negative for itching and visual disturbance.  Respiratory: Negative for cough.     Cardiovascular: Negative for chest pain, palpitations and leg swelling.  Gastrointestinal: Negative for abdominal distention, blood in stool, diarrhea and nausea.  Genitourinary: Negative for frequency and hematuria.  Musculoskeletal: Negative for back pain, gait problem, joint swelling and neck pain.  Skin: Negative for rash.  Neurological: Negative for dizziness, tremors, speech difficulty and weakness.  Psychiatric/Behavioral: Negative for agitation, dysphoric mood, sleep disturbance and suicidal ideas. The patient is not nervous/anxious.     Objective:  BP 124/70 (BP Location: Left Arm, Patient Position: Sitting, Cuff Size: Large)   Pulse 61   Temp 98.2 F (36.8 C) (Oral)   Ht 5\' 9"  (1.753 m)   Wt 201 lb (91.2 kg)   SpO2 96%   BMI 29.68 kg/m   BP Readings from Last 3 Encounters:  07/09/19 124/70  01/03/19 140/76  07/05/18 140/78    Wt Readings from Last 3 Encounters:  07/09/19 201 lb (91.2 kg)  01/03/19 206 lb (93.4 kg)  07/05/18 220 lb (99.8 kg)    Physical Exam Constitutional:      General: He is not in acute distress.    Appearance: He is well-developed.     Comments: NAD  Eyes:     Conjunctiva/sclera: Conjunctivae normal.     Pupils: Pupils are equal, round, and reactive to light.  Neck:  Thyroid: No thyromegaly.     Vascular: No JVD.  Cardiovascular:     Rate and Rhythm: Normal rate and regular rhythm.     Heart sounds: Normal heart sounds. No murmur. No friction rub. No gallop.   Pulmonary:     Effort: Pulmonary effort is normal. No respiratory distress.     Breath sounds: Normal breath sounds. No wheezing or rales.  Chest:     Chest wall: No tenderness.  Abdominal:     General: Bowel sounds are normal. There is no distension.     Palpations: Abdomen is soft. There is no mass.     Tenderness: There is no abdominal tenderness. There is no guarding or rebound.  Musculoskeletal:        General: No tenderness. Normal range of motion.     Cervical  back: Normal range of motion.  Lymphadenopathy:     Cervical: No cervical adenopathy.  Skin:    General: Skin is warm and dry.     Findings: No rash.  Neurological:     Mental Status: He is alert and oriented to person, place, and time.     Cranial Nerves: No cranial nerve deficit.     Motor: No abnormal muscle tone.     Coordination: Coordination normal.     Gait: Gait normal.     Deep Tendon Reflexes: Reflexes are normal and symmetric.  Psychiatric:        Behavior: Behavior normal.        Thought Content: Thought content normal.        Judgment: Judgment normal.     Lab Results  Component Value Date   WBC 6.8 12/25/2018   HGB 15.6 12/25/2018   HCT 46.9 12/25/2018   PLT 279.0 12/25/2018   GLUCOSE 97 12/25/2018   CHOL 176 12/25/2018   TRIG 128.0 12/25/2018   HDL 40.90 12/25/2018   LDLDIRECT 117.0 09/04/2015   LDLCALC 110 (H) 12/25/2018   ALT 11 12/25/2018   AST 12 12/25/2018   NA 142 12/25/2018   K 4.9 12/25/2018   CL 107 12/25/2018   CREATININE 1.83 (H) 12/25/2018   BUN 23 12/25/2018   CO2 27 12/25/2018   TSH 0.16 (L) 12/25/2018   PSA 3.74 12/25/2018   INR 1.00 10/04/2011   HGBA1C 5.7 07/03/2018    US RENAL  Result Date: 06/15/2019 CLINICAL DATA:  CKD stage G3B/A1 EXAM: RENAL / URINARY TRACT ULTRASOUND COMPLETE COMPARISON:  MR abdomen 03/21/2018, ultrasound 03/03/2018 FINDINGS: Right Kidney: Renal measurements: 11.2 x 4.9 x 5.0 cm = volume: 143 mL. There are multiple anechoic simple cyst present throughout the right kidney. Septate cyst in the anterior interpolar right kidney measuring 3.5 x 2.4 x 2.2 cm visualized on comparison MR and not significantly changed in size. Additional 1.9 x 1.7 x 1.5 cm cyst in the upper pole some questionable low-level internal echoes but overall demonstrating reduced complexity from comparison ultrasound in 2019. No solid renal lesions. No urolithiasis or visible hydronephrosis. Left Kidney: Renal measurements: 11.5 x 5.5 x 5.3 cm =  volume: 174 mL. Minimally complex cyst in the interpolar left kidney measuring 3.0 x 3.1 x 4.1 cm with septations on prior MR but no concerning features. Additional larger exophytic cyst arising from the lower pole left kidney measuring 4.6 x 4.5 x 4.5 cm. No new concerning renal lesions. No urolithiasis or hydronephrosis. Bladder: Only partially distended at the time of exam. No concerning bladder abnormality. Other: Prostatomegaly with the prostate measuring 5.3 x 5.0  x 7.1 cm for an estimated prostate volume of 100 cc. IMPRESSION: Numerous bilateral renal cysts, some of which demonstrate a small amount of internal complexity but appear benign on MR correlate. No new concerning renal lesions. No urolithiasis or hydronephrosis. Prostatomegaly. Electronically Signed   By: Lovena Le M.D.   On: 06/15/2019 23:39    Assessment & Plan:   Diagnoses and all orders for this visit:  Hypothyroidism due to acquired atrophy of thyroid  Hypertension, essential  Vitamin D deficiency  Chronic renal failure, stage 3a  B12 deficiency  Coronary atherosclerosis due to lipid rich plaque     No orders of the defined types were placed in this encounter.    Follow-up: No follow-ups on file.  Walker Kehr, MD

## 2019-07-10 ENCOUNTER — Other Ambulatory Visit: Payer: Self-pay | Admitting: Internal Medicine

## 2019-07-12 DIAGNOSIS — Z23 Encounter for immunization: Secondary | ICD-10-CM | POA: Diagnosis not present

## 2019-08-02 DIAGNOSIS — Z23 Encounter for immunization: Secondary | ICD-10-CM | POA: Diagnosis not present

## 2019-10-23 DIAGNOSIS — H6063 Unspecified chronic otitis externa, bilateral: Secondary | ICD-10-CM | POA: Diagnosis not present

## 2019-10-23 DIAGNOSIS — H6123 Impacted cerumen, bilateral: Secondary | ICD-10-CM | POA: Diagnosis not present

## 2019-11-13 DIAGNOSIS — H01009 Unspecified blepharitis unspecified eye, unspecified eyelid: Secondary | ICD-10-CM | POA: Diagnosis not present

## 2019-11-13 DIAGNOSIS — H2513 Age-related nuclear cataract, bilateral: Secondary | ICD-10-CM | POA: Diagnosis not present

## 2019-11-13 DIAGNOSIS — H1132 Conjunctival hemorrhage, left eye: Secondary | ICD-10-CM | POA: Diagnosis not present

## 2019-11-16 DIAGNOSIS — Z87448 Personal history of other diseases of urinary system: Secondary | ICD-10-CM | POA: Diagnosis not present

## 2019-11-16 DIAGNOSIS — R109 Unspecified abdominal pain: Secondary | ICD-10-CM | POA: Diagnosis not present

## 2019-11-16 DIAGNOSIS — N210 Calculus in bladder: Secondary | ICD-10-CM | POA: Diagnosis not present

## 2019-11-16 DIAGNOSIS — Z79899 Other long term (current) drug therapy: Secondary | ICD-10-CM | POA: Diagnosis not present

## 2019-11-16 DIAGNOSIS — N189 Chronic kidney disease, unspecified: Secondary | ICD-10-CM | POA: Diagnosis not present

## 2019-11-16 DIAGNOSIS — I129 Hypertensive chronic kidney disease with stage 1 through stage 4 chronic kidney disease, or unspecified chronic kidney disease: Secondary | ICD-10-CM | POA: Diagnosis not present

## 2019-11-16 DIAGNOSIS — N281 Cyst of kidney, acquired: Secondary | ICD-10-CM | POA: Diagnosis not present

## 2019-11-16 DIAGNOSIS — Z888 Allergy status to other drugs, medicaments and biological substances status: Secondary | ICD-10-CM | POA: Diagnosis not present

## 2019-11-16 DIAGNOSIS — N200 Calculus of kidney: Secondary | ICD-10-CM | POA: Diagnosis not present

## 2019-11-16 DIAGNOSIS — I10 Essential (primary) hypertension: Secondary | ICD-10-CM | POA: Diagnosis not present

## 2019-11-16 DIAGNOSIS — K5900 Constipation, unspecified: Secondary | ICD-10-CM | POA: Diagnosis not present

## 2019-11-19 ENCOUNTER — Telehealth: Payer: Self-pay | Admitting: Internal Medicine

## 2019-11-19 NOTE — Telephone Encounter (Signed)
New message:   Pt states he passed a kidney stone on Saturday and he was told to report back to his pcp. Pt states if you have questions to please call. Please advise.

## 2019-11-19 NOTE — Telephone Encounter (Signed)
Called pt, states he passed his kidney stones. Declined needing a follow up visit just wanted PCP to be aware.

## 2019-11-20 NOTE — Telephone Encounter (Signed)
Noted. Thx.

## 2019-11-26 ENCOUNTER — Other Ambulatory Visit: Payer: Self-pay | Admitting: Internal Medicine

## 2019-12-11 ENCOUNTER — Other Ambulatory Visit: Payer: Self-pay | Admitting: Internal Medicine

## 2019-12-13 DIAGNOSIS — B079 Viral wart, unspecified: Secondary | ICD-10-CM | POA: Diagnosis not present

## 2019-12-13 DIAGNOSIS — D225 Melanocytic nevi of trunk: Secondary | ICD-10-CM | POA: Diagnosis not present

## 2019-12-13 DIAGNOSIS — L821 Other seborrheic keratosis: Secondary | ICD-10-CM | POA: Diagnosis not present

## 2019-12-13 DIAGNOSIS — N1832 Chronic kidney disease, stage 3b: Secondary | ICD-10-CM | POA: Diagnosis not present

## 2019-12-13 DIAGNOSIS — S20362A Insect bite (nonvenomous) of left front wall of thorax, initial encounter: Secondary | ICD-10-CM | POA: Diagnosis not present

## 2019-12-13 DIAGNOSIS — L814 Other melanin hyperpigmentation: Secondary | ICD-10-CM | POA: Diagnosis not present

## 2019-12-13 DIAGNOSIS — E785 Hyperlipidemia, unspecified: Secondary | ICD-10-CM | POA: Diagnosis not present

## 2019-12-13 DIAGNOSIS — L579 Skin changes due to chronic exposure to nonionizing radiation, unspecified: Secondary | ICD-10-CM | POA: Diagnosis not present

## 2019-12-13 DIAGNOSIS — D1801 Hemangioma of skin and subcutaneous tissue: Secondary | ICD-10-CM | POA: Diagnosis not present

## 2019-12-13 DIAGNOSIS — L570 Actinic keratosis: Secondary | ICD-10-CM | POA: Diagnosis not present

## 2019-12-13 DIAGNOSIS — E039 Hypothyroidism, unspecified: Secondary | ICD-10-CM | POA: Diagnosis not present

## 2019-12-13 DIAGNOSIS — R7309 Other abnormal glucose: Secondary | ICD-10-CM | POA: Diagnosis not present

## 2019-12-18 DIAGNOSIS — H47323 Drusen of optic disc, bilateral: Secondary | ICD-10-CM | POA: Diagnosis not present

## 2020-01-08 ENCOUNTER — Ambulatory Visit: Payer: Medicare Other

## 2020-01-09 ENCOUNTER — Other Ambulatory Visit (INDEPENDENT_AMBULATORY_CARE_PROVIDER_SITE_OTHER): Payer: Medicare Other

## 2020-01-09 ENCOUNTER — Ambulatory Visit: Payer: Medicare Other | Admitting: Internal Medicine

## 2020-01-09 DIAGNOSIS — I10 Essential (primary) hypertension: Secondary | ICD-10-CM | POA: Diagnosis not present

## 2020-01-09 DIAGNOSIS — E034 Atrophy of thyroid (acquired): Secondary | ICD-10-CM | POA: Diagnosis not present

## 2020-01-09 LAB — BASIC METABOLIC PANEL
BUN: 17 mg/dL (ref 6–23)
CO2: 25 mEq/L (ref 19–32)
Calcium: 9.2 mg/dL (ref 8.4–10.5)
Chloride: 108 mEq/L (ref 96–112)
Creatinine, Ser: 1.57 mg/dL — ABNORMAL HIGH (ref 0.40–1.50)
GFR: 43.53 mL/min — ABNORMAL LOW (ref >60.00)
Glucose, Bld: 99 mg/dL (ref 70–99)
Potassium: 4.1 mEq/L (ref 3.5–5.1)
Sodium: 140 mEq/L (ref 135–145)

## 2020-01-09 LAB — TSH: TSH: 28.38 u[IU]/mL — ABNORMAL HIGH (ref 0.35–4.50)

## 2020-01-09 LAB — T4, FREE: Free T4: 0.66 ng/dL (ref 0.60–1.60)

## 2020-01-09 LAB — T3, FREE: T3, Free: 2.4 pg/mL (ref 2.3–4.2)

## 2020-01-10 ENCOUNTER — Ambulatory Visit (INDEPENDENT_AMBULATORY_CARE_PROVIDER_SITE_OTHER): Payer: Medicare Other

## 2020-01-10 DIAGNOSIS — Z0000 Encounter for general adult medical examination without abnormal findings: Secondary | ICD-10-CM | POA: Diagnosis not present

## 2020-01-10 NOTE — Progress Notes (Signed)
I connected with Donald Phillips, Jr. today by telephone and verified that I am speaking with the correct person using two identifiers. Location patient: home Location provider: work Persons participating in the virtual visit: Wade, Asebedo. and [employer]. Chayim Bialas, LPN   I discussed the limitations, risks, security and privacy concerns of performing an evaluation and management service by telephone and the availability of in person appointments. I also discussed with the patient that there may be a patient responsible charge related to this service. The patient expressed understanding and verbally consented to this telephonic visit.    Interactive audio and video telecommunications were attempted between this provider and patient, however failed, due to patient having technical difficulties OR patient did not have access to video capability.  We continued and completed visit with audio only.  Some vital signs may be absent or patient reported.   Time Spent with patient on telephone encounter: 20 minutes  Subjective:   Donald Fillers. is a 74 y.o. male who presents for Medicare Annual/Subsequent preventive examination.  Review of Systems    No ROS. Medicare Well Visit. Cardiac Risk Factors include: advanced age (>37men, >28 women);dyslipidemia;family history of premature cardiovascular disease;hypertension;male gender     Objective:    There were no vitals filed for this visit. There is no height or weight on file to calculate BMI.  Advanced Directives 01/10/2020 10/05/2011  Does Patient Have a Medical Advance Directive? Yes Patient does not have advance directive;Patient has advance directive, copy not in chart  Type of Advance Directive [place];Living will Living will  Copy of [place] in Chart? No - copy requested Copy requested from other (Comment)    Current Medications (verified) Outpatient Encounter Medications as of 01/10/2020    Medication Sig  . zinc gluconate 50 MG tablet Take 50 mg by mouth daily.  Marland Kitchen aspirin EC 81 MG tablet Take 1 tablet (81 mg total) by mouth daily.  . Biotin 5000 MCG TABS Take 1 tablet by mouth daily.  Marland Kitchen buPROPion (WELLBUTRIN XL) 300 MG 24 hr tablet Take 1 tablet by mouth once daily  . Cholecalciferol (VITAMIN D3) 1000 UNITS tablet Take 1,000 Units by mouth daily.    . Cyanocobalamin (VITAMIN B-12) 1000 MCG SUBL Place 1 tablet (1,000 mcg total) under the tongue daily.  . fenofibrate (TRICOR) 145 MG tablet Take 1 tablet by mouth once daily  . levothyroxine (SYNTHROID) 150 MCG tablet Take 1 tablet p.o. every other day alternating with 1/2 tablet every other day 30 minutes before breakfast  . NEEDLE, DISP, 18 G 18G X 1" MISC Use to draw up testosterone injection  . Pitavastatin Calcium (LIVALO) 1 MG TABS Take 1 tablet (1 mg total) by mouth daily.  . sildenafil (VIAGRA) 100 MG tablet Take 1 tablet (100 mg total) by mouth as needed for erectile dysfunction.  . SYRINGE-NEEDLE, DISP, 3 ML (BD ECLIPSE SYRINGE) 23G X 1" 3 ML MISC Use IM as directed  . terazosin (HYTRIN) 5 MG capsule Take 1 capsule by mouth at bedtime  . testosterone cypionate (DEPOTESTOSTERONE CYPIONATE) 200 MG/ML injection INJECT 1 ML (CC) INTRAMUSCULARLY EVERY TWO WEEKS (14 DAYS).  . Turmeric Curcumin 500 MG CAPS Take by mouth 1 day or 1 dose.   No facility-administered encounter medications on file as of 01/10/2020.    Allergies (verified) Amlodipine, Enalapril, and Lipitor [atorvastatin]   History: Past Medical History:  Diagnosis Date  . Allergic rhinitis   . Asthma  remote hx  . Chronic kidney disease    stones  . Diverticulosis of colon   . Hemorrhoid   . Hyperlipidemia   . Hypertension   . Impotence of organic origin   . Other testicular hypofunction   . Unspecified hypothyroidism    Past Surgical History:  Procedure Laterality Date  . distal radial fracture  10/04/2011  . HEMORRHOID SURGERY    . INGUINAL  HERNIA REPAIR    . vastectomy     Family History  Problem Relation Age of Onset  . Dementia Mother   . Cancer Father        prostate  . Heart disease Father        heart attack  . Stroke Father   . Cancer Sister   . Cancer Other        prostate   Social History   Socioeconomic History  . Marital status: Married    Spouse name: Not on file  . Number of children: Not on file  . Years of education: Not on file  . Highest education level: Not on file  Occupational History  . 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  Tobacco Use  . Smoking status: Former Research scientist (life sciences)  . Smokeless tobacco: Former Network engineer and Sexual Activity  . Alcohol use: No  . Drug use: No  . Sexual activity: Yes  Other Topics Concern  . Not on file  Social History Narrative   6 grandchildren.   Social Determinants of Health   Financial Resource Strain: Low Risk   . Difficulty of Paying Living Expenses: Not hard at all  Food Insecurity: No Food Insecurity  . Worried About Charity fundraiser in the Last Year: Never true  . Ran Out of Food in the Last Year: Never true  Transportation Needs: No Transportation Needs  . Lack of Transportation (Medical): No  . Lack of Transportation (Non-Medical): No  Physical Activity: Sufficiently Active  . Days of Exercise per Week: 5 days  . Minutes of Exercise per Session: 30 min  Stress: No Stress Concern Present  . Feeling of Stress : Not at all  Social Connections: Socially Integrated  . Frequency of Communication with Friends and Family: More than three times a week  . Frequency of Social Gatherings with Friends and Family: Once a week  . Attends Religious Services: More than 4 times per year  . Active Member of Clubs or Organizations: Yes  . Attends Archivist Meetings: 1 to 4 times per year  . Marital Status: Married    Tobacco Counseling Counseling given: Not Answered   Clinical Intake:  Pre-visit  preparation completed: Yes  Pain : No/denies pain     Nutritional Risks: None Diabetes: No  How often do you need to have someone help you when you read instructions, pamphlets, or other written materials from your doctor or pharmacy?: 1 - Never What is the last grade level you completed in school?: Master of Business Administration  Diabetic? no  Interpreter Needed?: No  Information entered by :: Oda Lansdowne N. Anupama Piehl, LPN   Activities of Daily Living In your present state of health, do you have any difficulty performing the following activities: 01/10/2020  Hearing? N  Vision? N  Difficulty concentrating or making decisions? N  Walking or climbing stairs? N  Dressing or bathing? N  Doing errands, shopping? N  Preparing Food and eating ? N  Using the Toilet? N  In the past six months, have you accidently leaked urine? N  Do you have problems with loss of bowel control? N  Managing your Medications? N  Managing your Finances? N  Housekeeping or managing your Housekeeping? N  Some recent data might be hidden    Patient Care Team: Plotnikov, Evie Lacks, MD as PCP - General Carlean Purl Ofilia Neas, MD as Consulting Physician (Gastroenterology)  Indicate any recent Medical Services you may have received from other than [facility] providers in the past year (date may be approximate).     Assessment:   This is a routine wellness examination for Donald Phillips.  Hearing/Vision screen No exam data present  Dietary issues and exercise activities discussed: Current Exercise Habits: Home exercise routine, Type of exercise: walking;Other - see comments (shag dancing), Time (Minutes): 30, Frequency (Times/Week): 5, Weekly Exercise (Minutes/Week): 150, Intensity: Moderate, Exercise limited by: None identified (yard work, [employer])  Goals    .  Patient Stated (pt-stated)      My goal is to start walking more, being more active and lose some weight.      Depression Screen PHQ 2/9 Scores  01/10/2020 11/23/2017 08/23/2017 07/19/2016 08/19/2014  PHQ - 2 Score 0 0 0 0 0  PHQ- 9 Score - 1 - - -  Exception Documentation - - - Patient refusal -    Fall Risk Fall Risk  01/10/2020 01/03/2019 08/23/2017 07/19/2016 09/12/2015  Falls in the past year? 0 0 No No No  Number falls in past yr: 0 0 - - -  Injury with Fall? 0 0 - - -  Risk for fall due to : No Fall Risks - - - -  Follow up Falls evaluation completed - - - -    Any stairs in or around the home? No  If so, are there any without handrails? No  Home free of loose throw rugs in walkways, pet beds, electrical cords, etc? Yes  Adequate lighting in your home to reduce risk of falls? Yes   ASSISTIVE DEVICES UTILIZED TO PREVENT FALLS:  Life alert? No  Use of a cane, walker or w/c? No  Grab bars in the bathroom? No  Shower chair or bench in shower? No  Elevated toilet seat or a handicapped toilet? No   TIMED UP AND GO:  Was the test performed? No .  Length of time to ambulate 10 feet: 0 sec.   Gait steady and fast without use of assistive device  Cognitive Function: Not indicated; Patient is cogitatively intact.        Immunizations Immunization History  Administered Date(s) Administered  . Fluad Quad(high Dose 65+) 01/03/2019  . Influenza, High Dose Seasonal PF 01/16/2016, 05/24/2017, 02/27/2018  . Influenza,inj,Quad PF,6+ Mos 02/19/2013, 04/19/2014  . PFIZER SARS-COV-2 Vaccination 07/12/2019, 08/02/2019  . Pneumococcal Conjugate-13 12/21/2013  . Pneumococcal Polysaccharide-23 04/19/2014  . Td 12/23/2014  . Zoster Recombinat (Shingrix) 11/27/2016, 05/31/2017    TDAP status: Up to date Flu Vaccine status: Up to date Pneumococcal vaccine status: Up to date Covid-19 vaccine status: Completed vaccines  Qualifies for Shingles Vaccine? Yes   Zostavax completed No   Shingrix Completed?: Yes  Screening Tests Health Maintenance  Topic Date Due  . Hepatitis C Screening  Never done  . INFLUENZA VACCINE  12/09/2019  .  Fecal DNA (Cologuard)  08/29/2020  . TETANUS/TDAP  12/22/2024  . COVID-19 Vaccine  Completed  . PNA vac Low Risk Adult  Completed    Health Maintenance  Health Maintenance Due  Topic Date Due  . Hepatitis C Screening  Never done  . INFLUENZA VACCINE  12/09/2019    Colorectal cancer screening: Completed 08/29/2017. Repeat every 3 years  Lung Cancer Screening: (Low Dose CT Chest recommended if Age 10-80 years, 30 pack-year currently smoking OR have quit w/in 15years.) does not qualify.   Lung Cancer Screening Referral: no  Additional Screening:  Hepatitis C Screening: does qualify; Completed no  Vision Screening: Recommended annual ophthalmology exams for early detection of glaucoma and other disorders of the eye. Is the patient up to date with their annual eye exam?  Yes  Who is the provider or what is the name of the office in which the patient attends annual eye exams? Eual Fines, MD If pt is not established with a provider, would they like to be referred to a provider to establish care? No .   Dental Screening: Recommended annual dental exams for proper oral hygiene  Community Resource Referral / Chronic Care Management: CRR required this visit?  No   CCM required this visit?  No      Plan:     I have personally reviewed and noted the following in the patient's chart:   . Medical and social history . Use of alcohol, tobacco or illicit drugs  . Current medications and supplements . Functional ability and status . Nutritional status . Physical activity . Advanced directives . List of other physicians . Hospitalizations, surgeries, and ER visits in previous 12 months . Vitals . Screenings to include cognitive, depression, and falls . Referrals and appointments  In addition, I have reviewed and discussed with patient certain preventive protocols, quality metrics, and best practice recommendations. A written personalized care plan for preventive services as  well as general preventive health recommendations were provided to patient.     Sheral Flow, LPN   10/10/2631   Nurse Notes:  Patient is cogitatively intact. There were no vitals filed for this visit. There is no height or weight on file to calculate BMI. Patient stated that he has no issues with gait or balance; does not use any assistive devices.

## 2020-01-10 NOTE — Patient Instructions (Signed)
Donald Phillips , Thank you for taking time to come for your Medicare Wellness Visit. I appreciate your ongoing commitment to your health goals. Please review the following plan we discussed and let me know if I can assist you in the future.   Screening recommendations/referrals: Colonoscopy: 08/29/2017; Cologuard due every 3 years. Recommended yearly ophthalmology/optometry visit for glaucoma screening and checkup Recommended yearly dental visit for hygiene and checkup  Vaccinations: Influenza vaccine: 01/03/2019 Pneumococcal vaccine: completed Tdap vaccine: 12/23/2014; due every 10 years Shingles vaccine: completed   Covid-19: completed; will be getting booster vaccine  Advanced directives: Please bring a copy of your health care power of attorney and living will to the office at your convenience.  Conditions/risks identified: Yes; Reviewed health maintenance screenings with patient today and relevant education, vaccines, and/or referrals were provided. Please continue to do your personal lifestyle choices by: daily care of teeth and gums, regular physical activity (goal should be 5 days a week for 30 minutes), eat a healthy diet, avoid tobacco and drug use, limiting any alcohol intake, taking a low-dose aspirin (if not allergic or have been advised by your provider otherwise) and taking vitamins and minerals as recommended by your provider. Continue doing brain stimulating activities (puzzles, reading, adult coloring books, staying active) to keep memory sharp. Continue to eat heart healthy diet (full of fruits, vegetables, whole grains, lean protein, water--limit salt, fat, and sugar intake) and increase physical activity as tolerated.  Next appointment: Please schedule your next Medicare Wellness Visit with your Nurse Health Advisor in 1 year.  Preventive Care 6 Years and Older, Male Preventive care refers to lifestyle choices and visits with your health care provider that can promote health and  wellness. What does preventive care include?  A yearly physical exam. This is also called an annual well check.  Dental exams once or twice a year.  Routine eye exams. Ask your health care provider how often you should have your eyes checked.  Personal lifestyle choices, including:  Daily care of your teeth and gums.  Regular physical activity.  Eating a healthy diet.  Avoiding tobacco and drug use.  Limiting alcohol use.  Practicing safe sex.  Taking low doses of aspirin every day.  Taking vitamin and mineral supplements as recommended by your health care provider. What happens during an annual well check? The services and screenings done by your health care provider during your annual well check will depend on your age, overall health, lifestyle risk factors, and family history of disease. Counseling  Your health care provider may ask you questions about your:  Alcohol use.  Tobacco use.  Drug use.  Emotional well-being.  Home and relationship well-being.  Sexual activity.  Eating habits.  History of falls.  Memory and ability to understand (cognition).  Work and work Statistician. Screening  You may have the following tests or measurements:  Height, weight, and BMI.  Blood pressure.  Lipid and cholesterol levels. These may be checked every 5 years, or more frequently if you are over 53 years old.  Skin check.  Lung cancer screening. You may have this screening every year starting at age 44 if you have a 30-pack-year history of smoking and currently smoke or have quit within the past 15 years.  Fecal occult blood test (FOBT) of the stool. You may have this test every year starting at age 22.  Flexible sigmoidoscopy or colonoscopy. You may have a sigmoidoscopy every 5 years or a colonoscopy every 10 years starting at  age 70.  Prostate cancer screening. Recommendations will vary depending on your family history and other risks.  Hepatitis C blood  test.  Hepatitis B blood test.  Sexually transmitted disease (STD) testing.  Diabetes screening. This is done by checking your blood sugar (glucose) after you have not eaten for a while (fasting). You may have this done every 1-3 years.  Abdominal aortic aneurysm (AAA) screening. You may need this if you are a current or former smoker.  Osteoporosis. You may be screened starting at age 51 if you are at high risk. Talk with your health care provider about your test results, treatment options, and if necessary, the need for more tests. Vaccines  Your health care provider may recommend certain vaccines, such as:  Influenza vaccine. This is recommended every year.  Tetanus, diphtheria, and acellular pertussis (Tdap, Td) vaccine. You may need a Td booster every 10 years.  Zoster vaccine. You may need this after age 64.  Pneumococcal 13-valent conjugate (PCV13) vaccine. One dose is recommended after age 56.  Pneumococcal polysaccharide (PPSV23) vaccine. One dose is recommended after age 55. Talk to your health care provider about which screenings and vaccines you need and how often you need them. This information is not intended to replace advice given to you by your health care provider. Make sure you discuss any questions you have with your health care provider. Document Released: 05/23/2015 Document Revised: 01/14/2016 Document Reviewed: 02/25/2015 Elsevier Interactive Patient Education  2017 [employer] Prevention in the Home Falls can cause injuries. They can happen to people of all ages. There are many things you can do to make your home safe and to help prevent falls. What can I do on the outside of my home?  Regularly fix the edges of walkways and driveways and fix any cracks.  Remove anything that might make you trip as you walk through a door, such as a raised step or threshold.  Trim any bushes or trees on the path to your home.  Use bright outdoor  lighting.  Clear any walking paths of anything that might make someone trip, such as rocks or tools.  Regularly check to see if handrails are loose or broken. Make sure that both sides of any steps have handrails.  Any raised decks and porches should have guardrails on the edges.  Have any leaves, snow, or ice cleared regularly.  Use sand or salt on walking paths during winter.  Clean up any spills in your garage right away. This includes oil or grease spills. What can I do in the bathroom?  Use night lights.  Install grab bars by the toilet and in the tub and shower. Do not use towel bars as grab bars.  Use non-skid mats or decals in the tub or shower.  If you need to sit down in the shower, use a plastic, non-slip stool.  Keep the floor dry. Clean up any water that spills on the floor as soon as it happens.  Remove soap buildup in the tub or shower regularly.  Attach bath mats securely with double-sided non-slip rug tape.  Do not have throw rugs and other things on the floor that can make you trip. What can I do in the bedroom?  Use night lights.  Make sure that you have a light by your bed that is easy to reach.  Do not use any sheets or blankets that are too big for your bed. They should not hang down onto the  floor.  Have a firm chair that has side arms. You can use this for support while you get dressed.  Do not have throw rugs and other things on the floor that can make you trip. What can I do in the kitchen?  Clean up any spills right away.  Avoid walking on wet floors.  Keep items that you use a lot in easy-to-reach places.  If you need to reach something above you, use a strong step stool that has a grab bar.  Keep electrical cords out of the way.  Do not use floor polish or wax that makes floors slippery. If you must use wax, use non-skid floor wax.  Do not have throw rugs and other things on the floor that can make you trip. What can I do with my  stairs?  Do not leave any items on the stairs.  Make sure that there are handrails on both sides of the stairs and use them. Fix handrails that are broken or loose. Make sure that handrails are as long as the stairways.  Check any carpeting to make sure that it is firmly attached to the stairs. Fix any carpet that is loose or worn.  Avoid having throw rugs at the top or bottom of the stairs. If you do have throw rugs, attach them to the floor with carpet tape.  Make sure that you have a light switch at the top of the stairs and the bottom of the stairs. If you do not have them, ask someone to add them for you. What else can I do to help prevent falls?  Wear shoes that:  Do not have high heels.  Have rubber bottoms.  Are comfortable and fit you well.  Are closed at the toe. Do not wear sandals.  If you use a stepladder:  Make sure that it is fully opened. Do not climb a closed stepladder.  Make sure that both sides of the stepladder are locked into place.  Ask someone to hold it for you, if possible.  Clearly mark and make sure that you can see:  Any grab bars or handrails.  First and last steps.  Where the edge of each step is.  Use tools that help you move around (mobility aids) if they are needed. These include:  Canes.  Walkers.  Scooters.  Crutches.  Turn on the lights when you go into a dark area. Replace any light bulbs as soon as they burn out.  Set up your furniture so you have a clear path. Avoid moving your furniture around.  If any of your floors are uneven, fix them.  If there are any pets around you, be aware of where they are.  Review your medicines with your doctor. Some medicines can make you feel dizzy. This can increase your chance of falling. Ask your doctor what other things that you can do to help prevent falls. This information is not intended to replace advice given to you by your health care provider. Make sure you discuss any  questions you have with your health care provider. Document Released: 02/20/2009 Document Revised: 10/02/2015 Document Reviewed: 05/31/2014 Elsevier Interactive Patient Education  2017 [employer].

## 2020-01-16 ENCOUNTER — Other Ambulatory Visit: Payer: Self-pay

## 2020-01-16 ENCOUNTER — Encounter: Payer: Self-pay | Admitting: Internal Medicine

## 2020-01-16 ENCOUNTER — Ambulatory Visit (INDEPENDENT_AMBULATORY_CARE_PROVIDER_SITE_OTHER): Payer: Medicare Other | Admitting: Internal Medicine

## 2020-01-16 VITALS — BP 140/78 | HR 64 | Temp 98.4°F | Ht 69.0 in | Wt 201.0 lb

## 2020-01-16 DIAGNOSIS — E039 Hypothyroidism, unspecified: Secondary | ICD-10-CM | POA: Diagnosis not present

## 2020-01-16 DIAGNOSIS — E785 Hyperlipidemia, unspecified: Secondary | ICD-10-CM

## 2020-01-16 DIAGNOSIS — N320 Bladder-neck obstruction: Secondary | ICD-10-CM | POA: Diagnosis not present

## 2020-01-16 DIAGNOSIS — I10 Essential (primary) hypertension: Secondary | ICD-10-CM | POA: Diagnosis not present

## 2020-01-16 DIAGNOSIS — E034 Atrophy of thyroid (acquired): Secondary | ICD-10-CM

## 2020-01-16 DIAGNOSIS — F329 Major depressive disorder, single episode, unspecified: Secondary | ICD-10-CM

## 2020-01-16 DIAGNOSIS — E559 Vitamin D deficiency, unspecified: Secondary | ICD-10-CM | POA: Diagnosis not present

## 2020-01-16 DIAGNOSIS — E538 Deficiency of other specified B group vitamins: Secondary | ICD-10-CM | POA: Diagnosis not present

## 2020-01-16 MED ORDER — LIVALO 1 MG PO TABS: 1.0000 mg | ORAL_TABLET | Freq: Every day | ORAL | 11 refills | Status: DC

## 2020-01-16 NOTE — Assessment & Plan Note (Signed)
Wellbutrin

## 2020-01-16 NOTE — Assessment & Plan Note (Signed)
On Vit D 

## 2020-01-16 NOTE — Assessment & Plan Note (Signed)
On B12 

## 2020-01-16 NOTE — Assessment & Plan Note (Signed)
BP Readings from Last 3 Encounters:  01/16/20 140/78  07/09/19 124/70  01/03/19 140/76

## 2020-01-16 NOTE — Assessment & Plan Note (Addendum)
The pt is not sure if he is taking Livalo - check at home

## 2020-01-16 NOTE — Progress Notes (Signed)
Subjective:  Patient ID: Donald Fillers., male    DOB: 1946/04/24  Age: 74 y.o. MRN: [id number]  CC: No chief complaint on file.   HPI Donald Fillers. presents for B12 def, hypothyroidism, HTN f/u  Outpatient Medications Prior to Visit  Medication Sig Dispense Refill  . aspirin EC 81 MG tablet Take 1 tablet (81 mg total) by mouth daily. 100 tablet 3  . Biotin 5000 MCG TABS Take 1 tablet by mouth daily.    Marland Kitchen buPROPion (WELLBUTRIN XL) 300 MG 24 hr tablet Take 1 tablet by mouth once daily 90 tablet 3  . Cholecalciferol (VITAMIN D3) 1000 UNITS tablet Take 1,000 Units by mouth daily.      . Cyanocobalamin (VITAMIN B-12) 1000 MCG SUBL Place 1 tablet (1,000 mcg total) under the tongue daily. 100 tablet 3  . fenofibrate (TRICOR) 145 MG tablet Take 1 tablet by mouth once daily 90 tablet 3  . levothyroxine (SYNTHROID) 150 MCG tablet Take 1 tablet p.o. every other day alternating with 1/2 tablet every other day 30 minutes before breakfast 90 tablet 3  . Pitavastatin Calcium (LIVALO) 1 MG TABS Take 1 tablet (1 mg total) by mouth daily. 30 tablet 11  . terazosin (HYTRIN) 5 MG capsule Take 1 capsule by mouth at bedtime 90 capsule 0  . Turmeric Curcumin 500 MG CAPS Take by mouth 1 day or 1 dose.    . zinc gluconate 50 MG tablet Take 50 mg by mouth daily.    Marland Kitchen NEEDLE, DISP, 18 G 18G X 1" MISC Use to draw up testosterone injection 50 each 0  . sildenafil (VIAGRA) 100 MG tablet Take 1 tablet (100 mg total) by mouth as needed for erectile dysfunction. 30 tablet 5  . SYRINGE-NEEDLE, DISP, 3 ML (BD ECLIPSE SYRINGE) 23G X 1" 3 ML MISC Use IM as directed 50 each 2  . testosterone cypionate (DEPOTESTOSTERONE CYPIONATE) 200 MG/ML injection INJECT 1 ML (CC) INTRAMUSCULARLY EVERY TWO WEEKS (14 DAYS). 10 mL 3   No facility-administered medications prior to visit.    ROS: Review of Systems  Constitutional: Negative for appetite change, fatigue and unexpected weight change.  HENT: Negative for congestion,  nosebleeds, sneezing, sore throat and trouble swallowing.   Eyes: Negative for itching and visual disturbance.  Respiratory: Negative for cough.   Cardiovascular: Negative for chest pain, palpitations and leg swelling.  Gastrointestinal: Negative for abdominal distention, blood in stool, diarrhea and nausea.  Genitourinary: Negative for frequency and hematuria.  Musculoskeletal: Negative for back pain, gait problem, joint swelling and neck pain.  Skin: Negative for rash.  Neurological: Negative for dizziness, tremors, speech difficulty and weakness.  Psychiatric/Behavioral: Negative for agitation, dysphoric mood and sleep disturbance. The patient is not nervous/anxious.     Objective:  BP 140/78 (BP Location: Right Arm, Patient Position: Sitting, Cuff Size: Large)   Pulse 64   Temp 98.4 F (36.9 C) (Oral)   Ht 5\' 9"  (1.753 m)   Wt 201 lb (91.2 kg)   SpO2 95%   BMI 29.68 kg/m   BP Readings from Last 3 Encounters:  01/16/20 140/78  07/09/19 124/70  01/03/19 140/76    Wt Readings from Last 3 Encounters:  01/16/20 201 lb (91.2 kg)  07/09/19 201 lb (91.2 kg)  01/03/19 206 lb (93.4 kg)    Physical Exam Constitutional:      General: He is not in acute distress.    Appearance: He is well-developed.     Comments: NAD  Eyes:     Conjunctiva/sclera: Conjunctivae normal.     Pupils: Pupils are equal, round, and reactive to light.  Neck:     Thyroid: No thyromegaly.     Vascular: No JVD.  Cardiovascular:     Rate and Rhythm: Normal rate and regular rhythm.     Heart sounds: Normal heart sounds. No murmur heard.  No friction rub. No gallop.   Pulmonary:     Effort: Pulmonary effort is normal. No respiratory distress.     Breath sounds: Normal breath sounds. No wheezing or rales.  Chest:     Chest wall: No tenderness.  Abdominal:     General: Bowel sounds are normal. There is no distension.     Palpations: Abdomen is soft. There is no mass.     Tenderness: There is no  abdominal tenderness. There is no guarding or rebound.  Musculoskeletal:        General: No tenderness. Normal range of motion.     Cervical back: Normal range of motion.  Lymphadenopathy:     Cervical: No cervical adenopathy.  Skin:    General: Skin is warm and dry.     Findings: No rash.  Neurological:     Mental Status: He is alert and oriented to person, place, and time.     Cranial Nerves: No cranial nerve deficit.     Motor: No abnormal muscle tone.     Coordination: Coordination normal.     Gait: Gait normal.     Deep Tendon Reflexes: Reflexes are normal and symmetric.  Psychiatric:        Behavior: Behavior normal.        Thought Content: Thought content normal.        Judgment: Judgment normal.     Lab Results  Component Value Date   WBC 6.8 12/25/2018   HGB 15.6 12/25/2018   HCT 46.9 12/25/2018   PLT 279.0 12/25/2018   GLUCOSE 99 01/09/2020   CHOL 176 12/25/2018   TRIG 128.0 12/25/2018   HDL 40.90 12/25/2018   LDLDIRECT 117.0 09/04/2015   LDLCALC 110 (H) 12/25/2018   ALT 11 12/25/2018   AST 12 12/25/2018   NA 140 01/09/2020   K 4.1 01/09/2020   CL 108 01/09/2020   CREATININE 1.57 (H) 01/09/2020   BUN 17 01/09/2020   CO2 25 01/09/2020   TSH 28.38 (H) 01/09/2020   PSA 3.74 12/25/2018   INR 1.00 10/04/2011   HGBA1C 5.7 07/03/2018    US RENAL  Result Date: 06/15/2019 CLINICAL DATA:  CKD stage G3B/A1 EXAM: RENAL / URINARY TRACT ULTRASOUND COMPLETE COMPARISON:  MR abdomen 03/21/2018, ultrasound 03/03/2018 FINDINGS: Right Kidney: Renal measurements: 11.2 x 4.9 x 5.0 cm = volume: 143 mL. There are multiple anechoic simple cyst present throughout the right kidney. Septate cyst in the anterior interpolar right kidney measuring 3.5 x 2.4 x 2.2 cm visualized on comparison MR and not significantly changed in size. Additional 1.9 x 1.7 x 1.5 cm cyst in the upper pole some questionable low-level internal echoes but overall demonstrating reduced complexity from comparison  ultrasound in 2019. No solid renal lesions. No urolithiasis or visible hydronephrosis. Left Kidney: Renal measurements: 11.5 x 5.5 x 5.3 cm = volume: 174 mL. Minimally complex cyst in the interpolar left kidney measuring 3.0 x 3.1 x 4.1 cm with septations on prior MR but no concerning features. Additional larger exophytic cyst arising from the lower pole left kidney measuring 4.6 x 4.5 x 4.5 cm. No new  concerning renal lesions. No urolithiasis or hydronephrosis. Bladder: Only partially distended at the time of exam. No concerning bladder abnormality. Other: Prostatomegaly with the prostate measuring 5.3 x 5.0 x 7.1 cm for an estimated prostate volume of 100 cc. IMPRESSION: Numerous bilateral renal cysts, some of which demonstrate a small amount of internal complexity but appear benign on MR correlate. No new concerning renal lesions. No urolithiasis or hydronephrosis. Prostatomegaly. Electronically Signed   By: Lovena Le M.D.   On: 06/15/2019 23:39    Assessment & Plan:    Walker Kehr, MD

## 2020-01-16 NOTE — Assessment & Plan Note (Signed)
On Levothroid 

## 2020-02-04 DIAGNOSIS — Z23 Encounter for immunization: Secondary | ICD-10-CM | POA: Diagnosis not present

## 2020-03-12 ENCOUNTER — Other Ambulatory Visit: Payer: Self-pay | Admitting: Internal Medicine

## 2020-05-29 DIAGNOSIS — Z85820 Personal history of malignant melanoma of skin: Secondary | ICD-10-CM | POA: Diagnosis not present

## 2020-05-29 DIAGNOSIS — L579 Skin changes due to chronic exposure to nonionizing radiation, unspecified: Secondary | ICD-10-CM | POA: Diagnosis not present

## 2020-05-29 DIAGNOSIS — L821 Other seborrheic keratosis: Secondary | ICD-10-CM | POA: Diagnosis not present

## 2020-05-29 DIAGNOSIS — L814 Other melanin hyperpigmentation: Secondary | ICD-10-CM | POA: Diagnosis not present

## 2020-05-29 DIAGNOSIS — D485 Neoplasm of uncertain behavior of skin: Secondary | ICD-10-CM | POA: Diagnosis not present

## 2020-05-29 DIAGNOSIS — D1801 Hemangioma of skin and subcutaneous tissue: Secondary | ICD-10-CM | POA: Diagnosis not present

## 2020-05-29 DIAGNOSIS — D225 Melanocytic nevi of trunk: Secondary | ICD-10-CM | POA: Diagnosis not present

## 2020-05-29 DIAGNOSIS — L570 Actinic keratosis: Secondary | ICD-10-CM | POA: Diagnosis not present

## 2020-06-13 DIAGNOSIS — I129 Hypertensive chronic kidney disease with stage 1 through stage 4 chronic kidney disease, or unspecified chronic kidney disease: Secondary | ICD-10-CM | POA: Diagnosis not present

## 2020-06-13 DIAGNOSIS — E785 Hyperlipidemia, unspecified: Secondary | ICD-10-CM | POA: Diagnosis not present

## 2020-06-13 DIAGNOSIS — N1832 Chronic kidney disease, stage 3b: Secondary | ICD-10-CM | POA: Diagnosis not present

## 2020-06-13 DIAGNOSIS — E039 Hypothyroidism, unspecified: Secondary | ICD-10-CM | POA: Diagnosis not present

## 2020-07-16 ENCOUNTER — Encounter: Payer: Self-pay | Admitting: Internal Medicine

## 2020-07-16 ENCOUNTER — Ambulatory Visit (INDEPENDENT_AMBULATORY_CARE_PROVIDER_SITE_OTHER): Payer: Medicare Other | Admitting: Internal Medicine

## 2020-07-16 ENCOUNTER — Other Ambulatory Visit: Payer: Self-pay

## 2020-07-16 DIAGNOSIS — N1831 Chronic kidney disease, stage 3a: Secondary | ICD-10-CM

## 2020-07-16 DIAGNOSIS — E039 Hypothyroidism, unspecified: Secondary | ICD-10-CM

## 2020-07-16 DIAGNOSIS — E538 Deficiency of other specified B group vitamins: Secondary | ICD-10-CM | POA: Diagnosis not present

## 2020-07-16 DIAGNOSIS — E785 Hyperlipidemia, unspecified: Secondary | ICD-10-CM | POA: Diagnosis not present

## 2020-07-16 DIAGNOSIS — I10 Essential (primary) hypertension: Secondary | ICD-10-CM | POA: Diagnosis not present

## 2020-07-16 DIAGNOSIS — N320 Bladder-neck obstruction: Secondary | ICD-10-CM | POA: Diagnosis not present

## 2020-07-16 DIAGNOSIS — I2510 Atherosclerotic heart disease of native coronary artery without angina pectoris: Secondary | ICD-10-CM | POA: Diagnosis not present

## 2020-07-16 DIAGNOSIS — I2583 Coronary atherosclerosis due to lipid rich plaque: Secondary | ICD-10-CM | POA: Diagnosis not present

## 2020-07-16 DIAGNOSIS — E559 Vitamin D deficiency, unspecified: Secondary | ICD-10-CM | POA: Diagnosis not present

## 2020-07-16 LAB — URINALYSIS
Bilirubin Urine: NEGATIVE
Hgb urine dipstick: NEGATIVE
Ketones, ur: NEGATIVE
Leukocytes,Ua: NEGATIVE
Nitrite: NEGATIVE
Specific Gravity, Urine: 1.025 (ref 1.000–1.030)
Total Protein, Urine: NEGATIVE
Urine Glucose: NEGATIVE
Urobilinogen, UA: 0.2 (ref 0.0–1.0)
pH: 6 (ref 5.0–8.0)

## 2020-07-16 LAB — COMPREHENSIVE METABOLIC PANEL
ALT: 14 U/L (ref 0–53)
AST: 18 U/L (ref 0–37)
Albumin: 4.1 g/dL (ref 3.5–5.2)
Alkaline Phosphatase: 62 U/L (ref 39–117)
BUN: 24 mg/dL — ABNORMAL HIGH (ref 6–23)
CO2: 29 mEq/L (ref 19–32)
Calcium: 9.3 mg/dL (ref 8.4–10.5)
Chloride: 106 mEq/L (ref 96–112)
Creatinine, Ser: 1.45 mg/dL (ref 0.40–1.50)
GFR: 47.81 mL/min — ABNORMAL LOW (ref >60.00)
Glucose, Bld: 94 mg/dL (ref 70–99)
Potassium: 4.4 mEq/L (ref 3.5–5.1)
Sodium: 141 mEq/L (ref 135–145)
Total Bilirubin: 0.6 mg/dL (ref 0.2–1.2)
Total Protein: 6.9 g/dL (ref 6.0–8.3)

## 2020-07-16 LAB — LIPID PANEL
Cholesterol: 207 mg/dL — ABNORMAL HIGH (ref 0–200)
HDL: 49.8 mg/dL (ref >39.00)
LDL Cholesterol: 131 mg/dL — ABNORMAL HIGH (ref 0–99)
NonHDL: 157.59
Total CHOL/HDL Ratio: 4
Triglycerides: 135 mg/dL (ref 0.0–149.0)
VLDL: 27 mg/dL (ref 0.0–40.0)

## 2020-07-16 LAB — PSA: PSA: 2.82 ng/mL (ref 0.10–4.00)

## 2020-07-16 LAB — T3, FREE: T3, Free: 3 pg/mL (ref 2.3–4.2)

## 2020-07-16 LAB — T4, FREE: Free T4: 0.86 ng/dL (ref 0.60–1.60)

## 2020-07-16 LAB — TSH: TSH: 17.96 u[IU]/mL — ABNORMAL HIGH (ref 0.35–4.50)

## 2020-07-16 NOTE — Assessment & Plan Note (Signed)
F/u w/Dr [facility] in Oct 2022

## 2020-07-16 NOTE — Addendum Note (Signed)
Addended by: Boris Lown B on: 07/16/2020 08:42 AM   Modules accepted: Orders

## 2020-07-16 NOTE — Assessment & Plan Note (Signed)
Coronary calcium CT score of 276.

## 2020-07-16 NOTE — Assessment & Plan Note (Signed)
On B12 

## 2020-07-16 NOTE — Assessment & Plan Note (Signed)
Cont w/Vit D 

## 2020-07-16 NOTE — Progress Notes (Signed)
Subjective:  Patient ID: Donald Fillers., male    DOB: 12-Feb-1946  Age: 75 y.o. MRN: [id number]  CC: Follow-up (6 month f/u)   HPI Donald Fillers. presents for HTN, hypothyroidism, dyslipidemia, Vit D def, CRI  Outpatient Medications Prior to Visit  Medication Sig Dispense Refill  . Biotin 5000 MCG TABS Take 1 tablet by mouth daily.    Marland Kitchen buPROPion (WELLBUTRIN XL) 300 MG 24 hr tablet Take 1 tablet by mouth once daily 90 tablet 3  . Cholecalciferol (VITAMIN D3) 1000 UNITS tablet Take 1,000 Units by mouth daily.    . Cyanocobalamin (VITAMIN B-12) 1000 MCG SUBL Place 1 tablet (1,000 mcg total) under the tongue daily. 100 tablet 3  . fenofibrate (TRICOR) 145 MG tablet Take 1 tablet by mouth once daily 90 tablet 3  . levothyroxine (SYNTHROID) 150 MCG tablet Take 1 tablet p.o. every other day alternating with 1/2 tablet every other day 30 minutes before breakfast 90 tablet 3  . Pitavastatin Calcium (LIVALO) 1 MG TABS Take 1 tablet (1 mg total) by mouth daily. 30 tablet 11  . terazosin (HYTRIN) 5 MG capsule Take 1 capsule by mouth at bedtime 90 capsule 2  . Turmeric Curcumin 500 MG CAPS Take by mouth 1 day or 1 dose.    . zinc gluconate 50 MG tablet Take 50 mg by mouth daily.    . sildenafil (VIAGRA) 100 MG tablet Take 1 tablet (100 mg total) by mouth as needed for erectile dysfunction. 30 tablet 5  . NEEDLE, DISP, 18 G 18G X 1" MISC Use to draw up testosterone injection 50 each 0  . SYRINGE-NEEDLE, DISP, 3 ML (BD ECLIPSE SYRINGE) 23G X 1" 3 ML MISC Use IM as directed 50 each 2  . testosterone cypionate (DEPOTESTOSTERONE CYPIONATE) 200 MG/ML injection INJECT 1 ML (CC) INTRAMUSCULARLY EVERY TWO WEEKS (14 DAYS). 10 mL 3   No facility-administered medications prior to visit.    ROS: Review of Systems  Constitutional: Negative for appetite change, fatigue and unexpected weight change.  HENT: Negative for congestion, nosebleeds, sneezing, sore throat and trouble swallowing.   Eyes:  Negative for itching and visual disturbance.  Respiratory: Negative for cough.   Cardiovascular: Negative for chest pain, palpitations and leg swelling.  Gastrointestinal: Negative for abdominal distention, blood in stool, diarrhea and nausea.  Genitourinary: Negative for frequency and hematuria.  Musculoskeletal: Negative for back pain, gait problem, joint swelling and neck pain.  Skin: Negative for rash.  Neurological: Negative for dizziness, tremors, speech difficulty and weakness.  Psychiatric/Behavioral: Negative for agitation, dysphoric mood, sleep disturbance and suicidal ideas. The patient is not nervous/anxious.     Objective:  BP 120/68 (BP Location: Left Arm)   Pulse 63   Temp 98.6 F (37 C) (Oral)   Ht 5\' 9"  (1.753 m)   Wt 205 lb 3.2 oz (93.1 kg)   SpO2 96%   BMI 30.30 kg/m   BP Readings from Last 3 Encounters:  07/16/20 120/68  01/16/20 140/78  07/09/19 124/70    Wt Readings from Last 3 Encounters:  07/16/20 205 lb 3.2 oz (93.1 kg)  01/16/20 201 lb (91.2 kg)  07/09/19 201 lb (91.2 kg)    Physical Exam Constitutional:      General: He is not in acute distress.    Appearance: He is well-developed.     Comments: NAD  HENT:     Mouth/Throat:     Mouth: Oropharynx is clear and moist.  Eyes:  Conjunctiva/sclera: Conjunctivae normal.     Pupils: Pupils are equal, round, and reactive to light.  Neck:     Thyroid: No thyromegaly.     Vascular: No JVD.  Cardiovascular:     Rate and Rhythm: Normal rate and regular rhythm.     Pulses: Intact distal pulses.     Heart sounds: Normal heart sounds. No murmur heard. No friction rub. No gallop.   Pulmonary:     Effort: Pulmonary effort is normal. No respiratory distress.     Breath sounds: Normal breath sounds. No wheezing or rales.  Chest:     Chest wall: No tenderness.  Abdominal:     General: Bowel sounds are normal. There is no distension.     Palpations: Abdomen is soft. There is no mass.      Tenderness: There is no abdominal tenderness. There is no guarding or rebound.  Musculoskeletal:        General: No tenderness or edema. Normal range of motion.     Cervical back: Normal range of motion.  Lymphadenopathy:     Cervical: No cervical adenopathy.  Skin:    General: Skin is warm and dry.     Findings: No rash.  Neurological:     Mental Status: He is alert and oriented to person, place, and time.     Cranial Nerves: No cranial nerve deficit.     Motor: No abnormal muscle tone.     Coordination: He displays a negative Romberg sign. Coordination normal.     Gait: Gait normal.     Deep Tendon Reflexes: Reflexes are normal and symmetric.  Psychiatric:        Mood and Affect: Mood and affect normal.        Behavior: Behavior normal.        Thought Content: Thought content normal.        Judgment: Judgment normal.     Lab Results  Component Value Date   WBC 6.8 12/25/2018   HGB 15.6 12/25/2018   HCT 46.9 12/25/2018   PLT 279.0 12/25/2018   GLUCOSE 99 01/09/2020   CHOL 176 12/25/2018   TRIG 128.0 12/25/2018   HDL 40.90 12/25/2018   LDLDIRECT 117.0 09/04/2015   LDLCALC 110 (H) 12/25/2018   ALT 11 12/25/2018   AST 12 12/25/2018   NA 140 01/09/2020   K 4.1 01/09/2020   CL 108 01/09/2020   CREATININE 1.57 (H) 01/09/2020   BUN 17 01/09/2020   CO2 25 01/09/2020   TSH 28.38 (H) 01/09/2020   PSA 3.74 12/25/2018   INR 1.00 10/04/2011   HGBA1C 5.7 07/03/2018    US RENAL  Result Date: 06/15/2019 CLINICAL DATA:  CKD stage G3B/A1 EXAM: RENAL / URINARY TRACT ULTRASOUND COMPLETE COMPARISON:  MR abdomen 03/21/2018, ultrasound 03/03/2018 FINDINGS: Right Kidney: Renal measurements: 11.2 x 4.9 x 5.0 cm = volume: 143 mL. There are multiple anechoic simple cyst present throughout the right kidney. Septate cyst in the anterior interpolar right kidney measuring 3.5 x 2.4 x 2.2 cm visualized on comparison MR and not significantly changed in size. Additional 1.9 x 1.7 x 1.5 cm cyst in  the upper pole some questionable low-level internal echoes but overall demonstrating reduced complexity from comparison ultrasound in 2019. No solid renal lesions. No urolithiasis or visible hydronephrosis. Left Kidney: Renal measurements: 11.5 x 5.5 x 5.3 cm = volume: 174 mL. Minimally complex cyst in the interpolar left kidney measuring 3.0 x 3.1 x 4.1 cm with septations on prior MR  but no concerning features. Additional larger exophytic cyst arising from the lower pole left kidney measuring 4.6 x 4.5 x 4.5 cm. No new concerning renal lesions. No urolithiasis or hydronephrosis. Bladder: Only partially distended at the time of exam. No concerning bladder abnormality. Other: Prostatomegaly with the prostate measuring 5.3 x 5.0 x 7.1 cm for an estimated prostate volume of 100 cc. IMPRESSION: Numerous bilateral renal cysts, some of which demonstrate a small amount of internal complexity but appear benign on MR correlate. No new concerning renal lesions. No urolithiasis or hydronephrosis. Prostatomegaly. Electronically Signed   By: Lovena Le M.D.   On: 06/15/2019 23:39    Assessment & Plan:    Walker Kehr, MD

## 2020-07-16 NOTE — Addendum Note (Signed)
Addended by: Boris Lown B on: 07/16/2020 08:43 AM   Modules accepted: Orders

## 2020-07-16 NOTE — Assessment & Plan Note (Signed)
Cont w/Hytrin 

## 2020-08-04 ENCOUNTER — Telehealth: Payer: Self-pay | Admitting: Internal Medicine

## 2020-08-04 MED ORDER — LIVALO 1 MG PO TABS: 1.0000 mg | ORAL_TABLET | Freq: Every day | ORAL | 5 refills | Status: DC

## 2020-08-04 NOTE — Telephone Encounter (Signed)
Patient requesting refill for Pitavastatin Calcium (LIVALO) 1 MG TABS  Pharmacy [facility] [address], [place]

## 2020-08-04 NOTE — Telephone Encounter (Signed)
Reviewed chart pt is up-to-date sent refills to [facility]  

## 2020-08-20 ENCOUNTER — Other Ambulatory Visit: Payer: Self-pay | Admitting: Internal Medicine

## 2020-08-26 DIAGNOSIS — Z23 Encounter for immunization: Secondary | ICD-10-CM | POA: Diagnosis not present

## 2020-10-09 ENCOUNTER — Other Ambulatory Visit: Payer: Self-pay | Admitting: Internal Medicine

## 2020-11-24 DIAGNOSIS — L570 Actinic keratosis: Secondary | ICD-10-CM | POA: Diagnosis not present

## 2020-11-24 DIAGNOSIS — L814 Other melanin hyperpigmentation: Secondary | ICD-10-CM | POA: Diagnosis not present

## 2020-11-24 DIAGNOSIS — Z85820 Personal history of malignant melanoma of skin: Secondary | ICD-10-CM | POA: Diagnosis not present

## 2020-11-24 DIAGNOSIS — Z85828 Personal history of other malignant neoplasm of skin: Secondary | ICD-10-CM | POA: Diagnosis not present

## 2020-11-24 DIAGNOSIS — L579 Skin changes due to chronic exposure to nonionizing radiation, unspecified: Secondary | ICD-10-CM | POA: Diagnosis not present

## 2020-11-24 DIAGNOSIS — D1801 Hemangioma of skin and subcutaneous tissue: Secondary | ICD-10-CM | POA: Diagnosis not present

## 2020-11-24 DIAGNOSIS — L821 Other seborrheic keratosis: Secondary | ICD-10-CM | POA: Diagnosis not present

## 2020-11-24 DIAGNOSIS — D225 Melanocytic nevi of trunk: Secondary | ICD-10-CM | POA: Diagnosis not present

## 2020-12-13 ENCOUNTER — Other Ambulatory Visit: Payer: Self-pay | Admitting: Internal Medicine

## 2020-12-25 DIAGNOSIS — M216X2 Other acquired deformities of left foot: Secondary | ICD-10-CM | POA: Diagnosis not present

## 2020-12-25 DIAGNOSIS — M7742 Metatarsalgia, left foot: Secondary | ICD-10-CM | POA: Diagnosis not present

## 2021-01-22 ENCOUNTER — Ambulatory Visit (INDEPENDENT_AMBULATORY_CARE_PROVIDER_SITE_OTHER): Payer: Medicare Other | Admitting: Internal Medicine

## 2021-01-22 ENCOUNTER — Encounter: Payer: Self-pay | Admitting: Internal Medicine

## 2021-01-22 ENCOUNTER — Other Ambulatory Visit: Payer: Self-pay

## 2021-01-22 VITALS — BP 130/70 | HR 54 | Temp 98.1°F | Ht 69.0 in | Wt 203.4 lb

## 2021-01-22 DIAGNOSIS — N1831 Chronic kidney disease, stage 3a: Secondary | ICD-10-CM

## 2021-01-22 DIAGNOSIS — Z23 Encounter for immunization: Secondary | ICD-10-CM

## 2021-01-22 DIAGNOSIS — Z1211 Encounter for screening for malignant neoplasm of colon: Secondary | ICD-10-CM | POA: Diagnosis not present

## 2021-01-22 DIAGNOSIS — E538 Deficiency of other specified B group vitamins: Secondary | ICD-10-CM

## 2021-01-22 DIAGNOSIS — Z789 Other specified health status: Secondary | ICD-10-CM

## 2021-01-22 DIAGNOSIS — I2510 Atherosclerotic heart disease of native coronary artery without angina pectoris: Secondary | ICD-10-CM | POA: Diagnosis not present

## 2021-01-22 DIAGNOSIS — E034 Atrophy of thyroid (acquired): Secondary | ICD-10-CM

## 2021-01-22 DIAGNOSIS — I2583 Coronary atherosclerosis due to lipid rich plaque: Secondary | ICD-10-CM | POA: Diagnosis not present

## 2021-01-22 LAB — COMPREHENSIVE METABOLIC PANEL
ALT: 16 U/L (ref 0–53)
AST: 20 U/L (ref 0–37)
Albumin: 4 g/dL (ref 3.5–5.2)
Alkaline Phosphatase: 54 U/L (ref 39–117)
BUN: 20 mg/dL (ref 6–23)
CO2: 28 mEq/L (ref 19–32)
Calcium: 9.2 mg/dL (ref 8.4–10.5)
Chloride: 107 mEq/L (ref 96–112)
Creatinine, Ser: 1.35 mg/dL (ref 0.40–1.50)
GFR: 51.9 mL/min — ABNORMAL LOW (ref >60.00)
Glucose, Bld: 93 mg/dL (ref 70–99)
Potassium: 4.1 mEq/L (ref 3.5–5.1)
Sodium: 141 mEq/L (ref 135–145)
Total Bilirubin: 0.4 mg/dL (ref 0.2–1.2)
Total Protein: 6.9 g/dL (ref 6.0–8.3)

## 2021-01-22 LAB — LIPID PANEL
Cholesterol: 201 mg/dL — ABNORMAL HIGH (ref 0–200)
HDL: 49.5 mg/dL (ref >39.00)
LDL Cholesterol: 128 mg/dL — ABNORMAL HIGH (ref 0–99)
NonHDL: 151.86
Total CHOL/HDL Ratio: 4
Triglycerides: 118 mg/dL (ref 0.0–149.0)
VLDL: 23.6 mg/dL (ref 0.0–40.0)

## 2021-01-22 LAB — TSH: TSH: 20.87 u[IU]/mL — ABNORMAL HIGH (ref 0.35–5.50)

## 2021-01-22 LAB — T4, FREE: Free T4: 0.72 ng/dL (ref 0.60–1.60)

## 2021-01-22 LAB — T3, FREE: T3, Free: 2.7 pg/mL (ref 2.3–4.2)

## 2021-01-22 NOTE — Progress Notes (Signed)
Subjective:  Patient ID: Donald Phillips., male    DOB: 20-Nov-1945  Age: 75 y.o. MRN: [id number]  CC: No chief complaint on file.   HPI Donald Phillips. presents for dyslipidemia - Livalo is too $$$ F/u HTN, hypothyroidism  Outpatient Medications Prior to Visit  Medication Sig Dispense Refill   Biotin 5000 MCG TABS Take 1 tablet by mouth daily.     buPROPion (WELLBUTRIN XL) 300 MG 24 hr tablet Take 1 tablet by mouth once daily 90 tablet 3   Cholecalciferol (VITAMIN D3) 1000 UNITS tablet Take 1,000 Units by mouth daily.     Cyanocobalamin (VITAMIN B-12) 1000 MCG SUBL Place 1 tablet (1,000 mcg total) under the tongue daily. 100 tablet 3   fenofibrate (TRICOR) 145 MG tablet Take 1 tablet by mouth once daily 90 tablet 3   levothyroxine (SYNTHROID) 150 MCG tablet TAKE 1 TABLET BY MOUTH EVERY OTHER DAY ALTERNTING  WITH  1/2  TABLET  EVERY  OTHER  DAY  30  MINUTES  BEFORE  BREAKFAST 68 tablet 5   terazosin (HYTRIN) 5 MG capsule Take 1 capsule by mouth at bedtime 90 capsule 1   Turmeric Curcumin 500 MG CAPS Take by mouth 1 day or 1 dose.     zinc gluconate 50 MG tablet Take 50 mg by mouth daily.     sildenafil (VIAGRA) 100 MG tablet Take 1 tablet (100 mg total) by mouth as needed for erectile dysfunction. 30 tablet 5   Pitavastatin Calcium (LIVALO) 1 MG TABS Take 1 tablet (1 mg total) by mouth daily. (Patient not taking: Reported on 01/22/2021) 30 tablet 5   No facility-administered medications prior to visit.    ROS: Review of Systems  Constitutional:  Negative for appetite change, fatigue and unexpected weight change.  HENT:  Negative for congestion, nosebleeds, sneezing, sore throat and trouble swallowing.   Eyes:  Negative for itching and visual disturbance.  Respiratory:  Negative for cough.   Cardiovascular:  Negative for chest pain, palpitations and leg swelling.  Gastrointestinal:  Negative for abdominal distention, blood in stool, diarrhea and nausea.  Genitourinary:   Negative for frequency and hematuria.  Musculoskeletal:  Positive for arthralgias. Negative for back pain, gait problem, joint swelling and neck pain.  Skin:  Negative for rash.  Neurological:  Negative for dizziness, tremors, speech difficulty and weakness.  Psychiatric/Behavioral:  Negative for agitation, dysphoric mood and sleep disturbance. The patient is not nervous/anxious.    Objective:  BP 130/70 (BP Location: Left Arm)   Pulse (!) 54   Temp 98.1 F (36.7 C) (Oral)   Ht '5\' 9"'$  (1.753 m)   Wt 203 lb 6.4 oz (92.3 kg)   SpO2 96%   BMI 30.04 kg/m   BP Readings from Last 3 Encounters:  01/22/21 130/70  07/16/20 120/68  01/16/20 140/78    Wt Readings from Last 3 Encounters:  01/22/21 203 lb 6.4 oz (92.3 kg)  07/16/20 205 lb 3.2 oz (93.1 kg)  01/16/20 201 lb (91.2 kg)    Physical Exam Constitutional:      General: He is not in acute distress.    Appearance: He is well-developed.     Comments: NAD  Eyes:     Conjunctiva/sclera: Conjunctivae normal.     Pupils: Pupils are equal, round, and reactive to light.  Neck:     Thyroid: No thyromegaly.     Vascular: No JVD.  Cardiovascular:     Rate and Rhythm: Normal rate and  regular rhythm.     Heart sounds: Normal heart sounds. No murmur heard.   No friction rub. No gallop.  Pulmonary:     Effort: Pulmonary effort is normal. No respiratory distress.     Breath sounds: Normal breath sounds. No wheezing or rales.  Chest:     Chest wall: No tenderness.  Abdominal:     General: Bowel sounds are normal. There is no distension.     Palpations: Abdomen is soft. There is no mass.     Tenderness: There is no abdominal tenderness. There is no guarding or rebound.  Musculoskeletal:        General: No tenderness. Normal range of motion.     Cervical back: Normal range of motion.  Lymphadenopathy:     Cervical: No cervical adenopathy.  Skin:    General: Skin is warm and dry.     Findings: No rash.  Neurological:     Mental  Status: He is alert and oriented to person, place, and time.     Cranial Nerves: No cranial nerve deficit.     Motor: No abnormal muscle tone.     Coordination: Coordination normal.     Gait: Gait normal.     Deep Tendon Reflexes: Reflexes are normal and symmetric.  Psychiatric:        Behavior: Behavior normal.        Thought Content: Thought content normal.        Judgment: Judgment normal.    Lab Results  Component Value Date   WBC 6.8 12/25/2018   HGB 15.6 12/25/2018   HCT 46.9 12/25/2018   PLT 279.0 12/25/2018   GLUCOSE 94 07/16/2020   CHOL 207 (H) 07/16/2020   TRIG 135.0 07/16/2020   HDL 49.80 07/16/2020   LDLDIRECT 117.0 09/04/2015   LDLCALC 131 (H) 07/16/2020   ALT 14 07/16/2020   AST 18 07/16/2020   NA 141 07/16/2020   K 4.4 07/16/2020   CL 106 07/16/2020   CREATININE 1.45 07/16/2020   BUN 24 (H) 07/16/2020   CO2 29 07/16/2020   TSH 17.96 (H) 07/16/2020   PSA 2.82 07/16/2020   INR 1.00 10/04/2011   HGBA1C 5.7 07/03/2018    US RENAL  Result Date: 06/15/2019 CLINICAL DATA:  CKD stage G3B/A1 EXAM: RENAL / URINARY TRACT ULTRASOUND COMPLETE COMPARISON:  MR abdomen 03/21/2018, ultrasound 03/03/2018 FINDINGS: Right Kidney: Renal measurements: 11.2 x 4.9 x 5.0 cm = volume: 143 mL. There are multiple anechoic simple cyst present throughout the right kidney. Septate cyst in the anterior interpolar right kidney measuring 3.5 x 2.4 x 2.2 cm visualized on comparison MR and not significantly changed in size. Additional 1.9 x 1.7 x 1.5 cm cyst in the upper pole some questionable low-level internal echoes but overall demonstrating reduced complexity from comparison ultrasound in 2019. No solid renal lesions. No urolithiasis or visible hydronephrosis. Left Kidney: Renal measurements: 11.5 x 5.5 x 5.3 cm = volume: 174 mL. Minimally complex cyst in the interpolar left kidney measuring 3.0 x 3.1 x 4.1 cm with septations on prior MR but no concerning features. Additional larger exophytic  cyst arising from the lower pole left kidney measuring 4.6 x 4.5 x 4.5 cm. No new concerning renal lesions. No urolithiasis or hydronephrosis. Bladder: Only partially distended at the time of exam. No concerning bladder abnormality. Other: Prostatomegaly with the prostate measuring 5.3 x 5.0 x 7.1 cm for an estimated prostate volume of 100 cc. IMPRESSION: Numerous bilateral renal cysts, some of which demonstrate  a small amount of internal complexity but appear benign on MR correlate. No new concerning renal lesions. No urolithiasis or hydronephrosis. Prostatomegaly. Electronically Signed   By: Lovena Le M.D.   On: 06/15/2019 23:39    Assessment & Plan:   Problem List Items Addressed This Visit     B12 deficiency    Cont on Vit B12      Coronary atherosclerosis    Statin intolerant - pt had to stop Livalo too - 2022 Cardiol ref to discuss Repatha      CRF (chronic renal failure)    NAS diet. Continue w/good hydration Monitor GFR      Relevant Orders   T3, free   T4, free   TSH   Lipid panel   Comprehensive metabolic panel   Hypothyroidism - Primary   Relevant Orders   T3, free   T4, free   TSH   Lipid panel   Comprehensive metabolic panel   Statin intolerance    Statin intolerant - pt had to stop Livalo too - 2022 Cardiol ref to discuss Repatha      Relevant Orders   Lipid panel      Follow-up: Return in about 6 months (around 07/22/2021) for Wellness Exam.  Walker Kehr, MD

## 2021-01-22 NOTE — Assessment & Plan Note (Signed)
Statin intolerant - pt had to stop Livalo too - 2022 Cardiol ref to discuss Repatha

## 2021-01-22 NOTE — Assessment & Plan Note (Signed)
NAS diet. Continue w/good hydration Monitor GFR

## 2021-01-22 NOTE — Addendum Note (Signed)
Addended by: Boris Lown B on: 01/22/2021 08:36 AM   Modules accepted: Orders

## 2021-01-22 NOTE — Addendum Note (Signed)
Addended by: Earnstine Regal on: 01/22/2021 09:03 AM   Modules accepted: Orders

## 2021-01-22 NOTE — Assessment & Plan Note (Signed)
Cont on Vit B12 

## 2021-01-23 IMAGING — US US RENAL
1 series · 13 of 25 positions shown · non-contrast
Comparison: MR abdomen 03/21/2018, ultrasound 03/03/2018

CLINICAL DATA: CKD stage G3B/A1

EXAM:
RENAL / URINARY TRACT ULTRASOUND COMPLETE

[Series 1: us renal · 0.26mm/px · 13 of 76 slices shown]
[im 1/76]
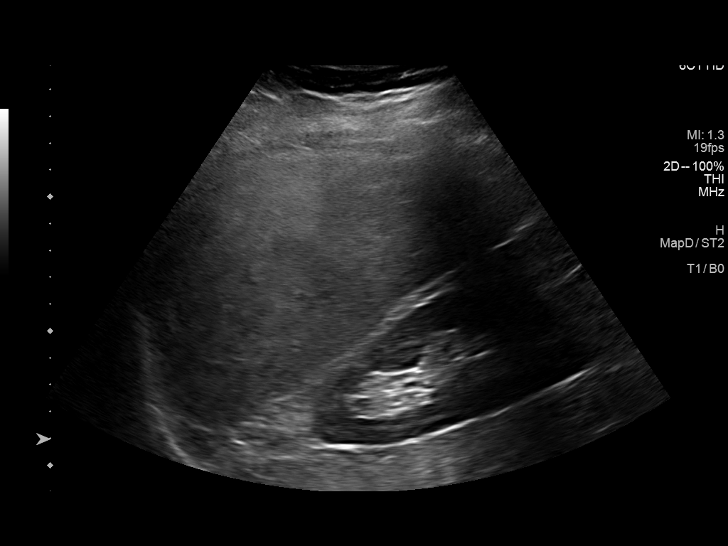
[im 7/76]
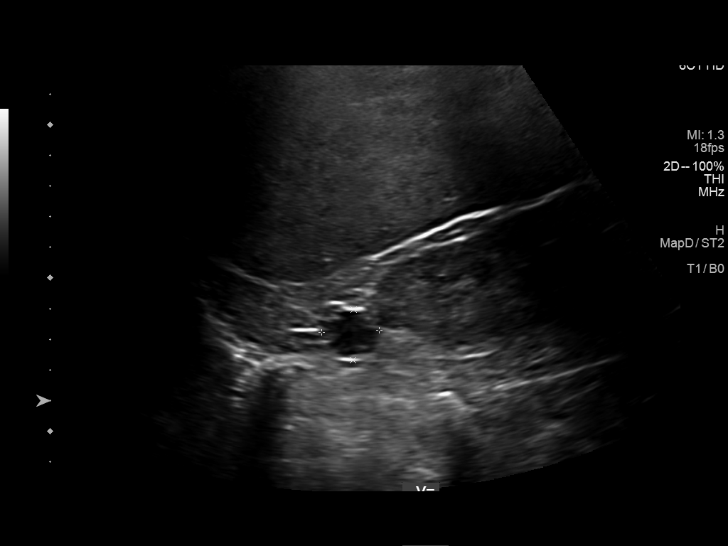
[im 13/76]
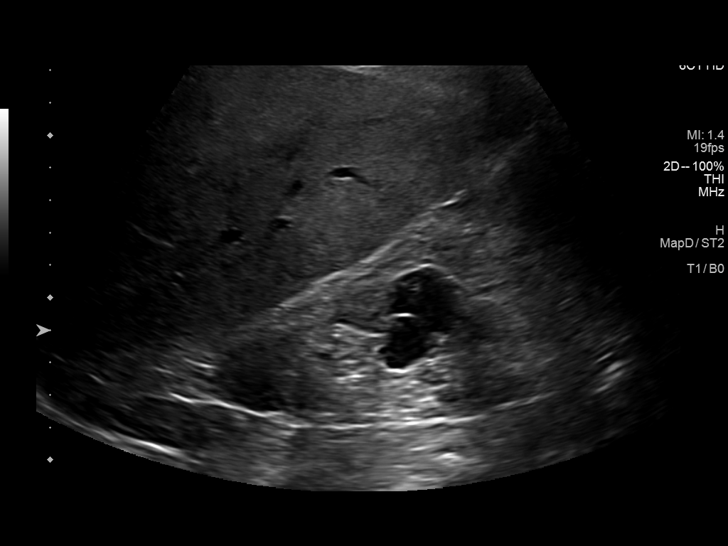
[im 19/76]
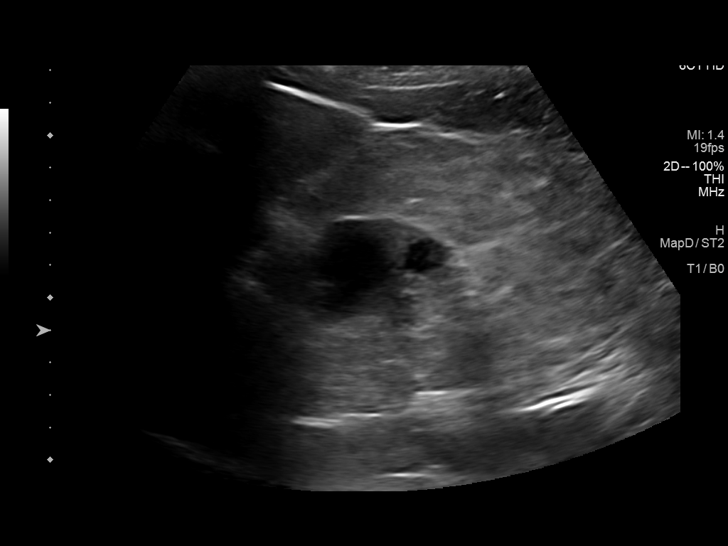
[im 26/76]
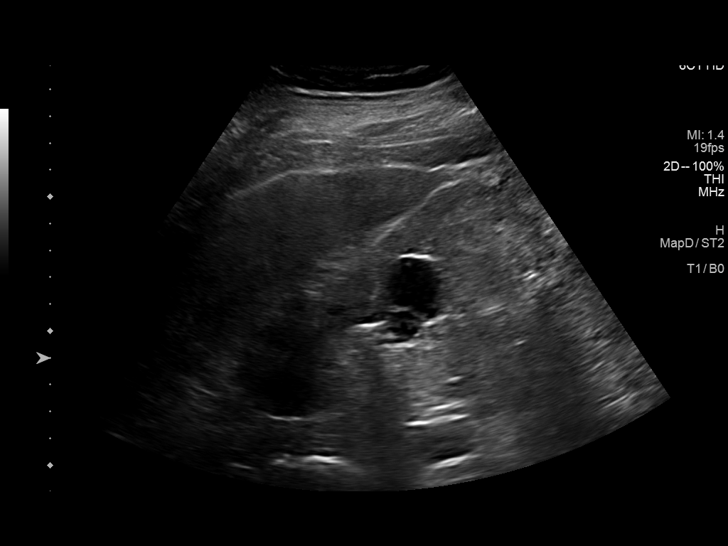
[im 32/76]
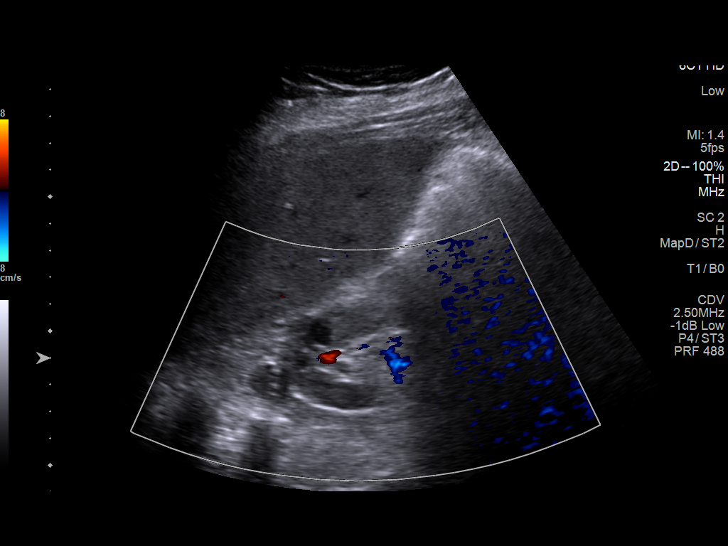
[im 38/76]
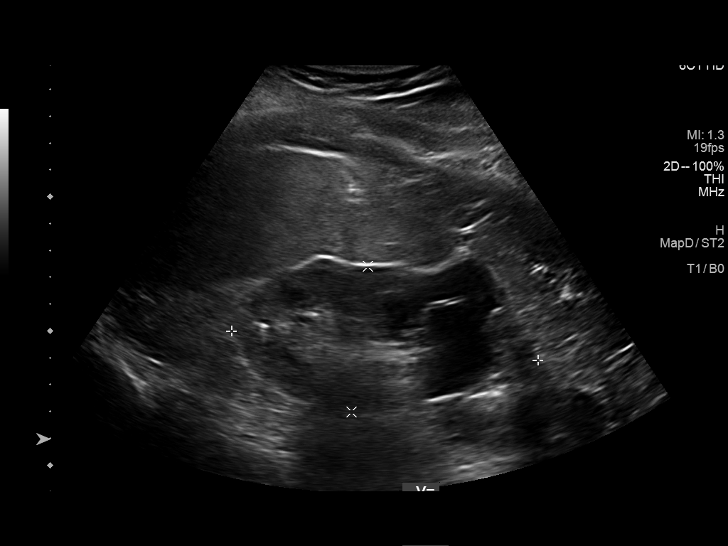
[im 44/76]
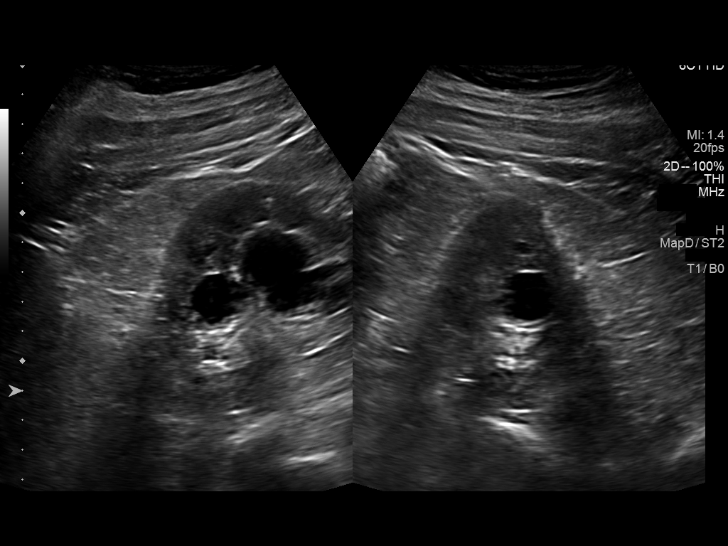
[im 51/76]
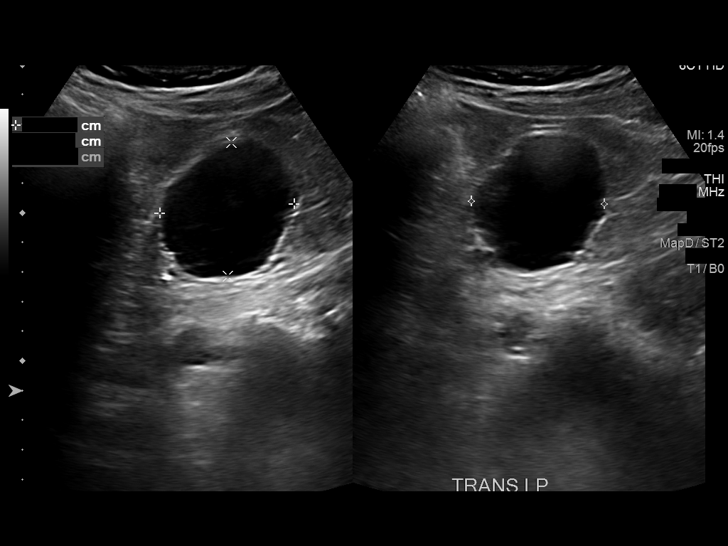
[im 57/76]
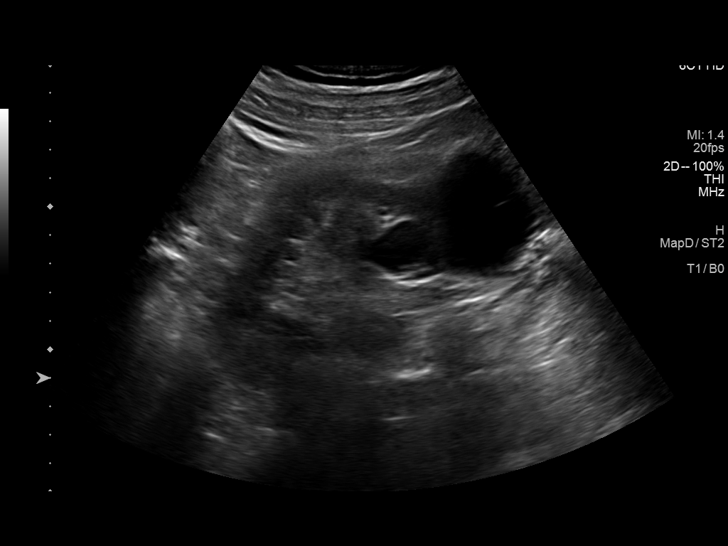
[im 63/76]
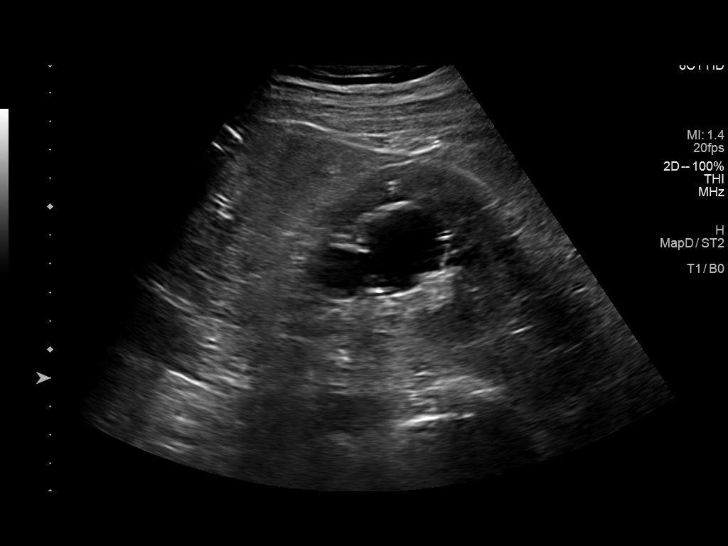
[im 69/76]
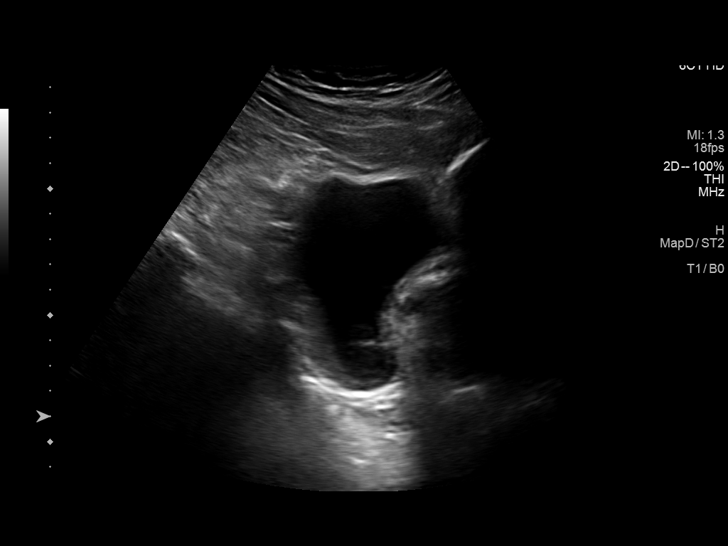
[im 76/76]
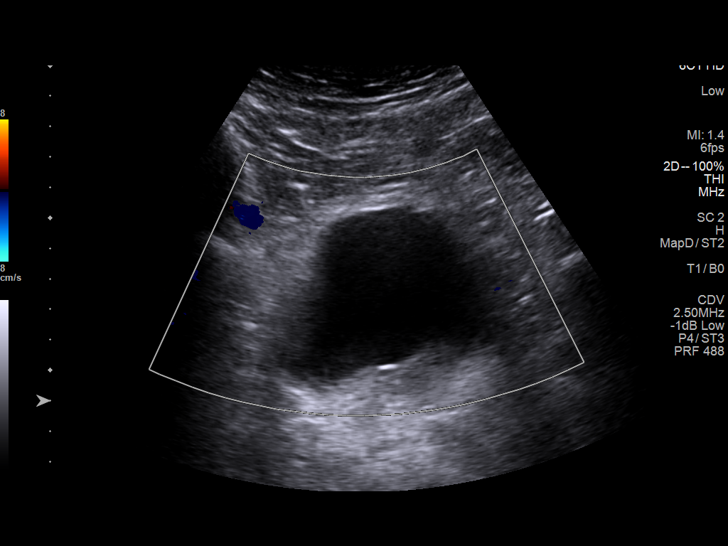

[13 of 25 positions shown; findings below may reference images not displayed]

FINDINGS: Right Kidney:

Renal measurements: 11.2 x 4.9 x 5.0 cm = volume: 143 mL. There are
multiple anechoic simple cyst present throughout the right kidney.
Septate cyst in the anterior interpolar right kidney measuring 3.5 x
2.4 x 2.2 cm visualized on comparison MR and not significantly
changed in size. Additional 1.9 x 1.7 x 1.5 cm cyst in the upper
pole some questionable low-level internal echoes but overall
demonstrating reduced complexity from comparison ultrasound in 7535.
No solid renal lesions. No urolithiasis or visible hydronephrosis.

Left Kidney:

Renal measurements: 11.5 x 5.5 x 5.3 cm = volume: 174 mL. Minimally
complex cyst in the interpolar left kidney measuring 3.0 x 3.1 x
cm with septations on prior MR but no concerning features.
Additional larger exophytic cyst arising from the lower pole left
kidney measuring 4.6 x 4.5 x 4.5 cm. No new concerning renal
lesions. No urolithiasis or hydronephrosis.

Bladder:

Only partially distended at the time of exam. No concerning bladder
abnormality.

Other:

Prostatomegaly with the prostate measuring 5.3 x 5.0 x 7.1 cm for an
estimated prostate volume of 100 cc.
IMPRESSION: Numerous bilateral renal cysts, some of which demonstrate a small
amount of internal complexity but appear benign on MR correlate. No
new concerning renal lesions.

No urolithiasis or hydronephrosis.

Prostatomegaly.

## 2021-01-23 NOTE — Addendum Note (Signed)
Addended by: Earnstine Regal on: 01/23/2021 10:07 AM   Modules accepted: Orders

## 2021-02-04 DIAGNOSIS — Z1211 Encounter for screening for malignant neoplasm of colon: Secondary | ICD-10-CM | POA: Diagnosis not present

## 2021-02-09 DIAGNOSIS — Z789 Other specified health status: Secondary | ICD-10-CM | POA: Diagnosis not present

## 2021-02-09 DIAGNOSIS — E789 Disorder of lipoprotein metabolism, unspecified: Secondary | ICD-10-CM | POA: Diagnosis not present

## 2021-02-09 DIAGNOSIS — R931 Abnormal findings on diagnostic imaging of heart and coronary circulation: Secondary | ICD-10-CM | POA: Diagnosis not present

## 2021-02-09 DIAGNOSIS — I2583 Coronary atherosclerosis due to lipid rich plaque: Secondary | ICD-10-CM | POA: Diagnosis not present

## 2021-02-09 DIAGNOSIS — E034 Atrophy of thyroid (acquired): Secondary | ICD-10-CM | POA: Insufficient documentation

## 2021-02-09 DIAGNOSIS — I2510 Atherosclerotic heart disease of native coronary artery without angina pectoris: Secondary | ICD-10-CM | POA: Diagnosis not present

## 2021-02-09 DIAGNOSIS — I10 Essential (primary) hypertension: Secondary | ICD-10-CM | POA: Diagnosis not present

## 2021-02-10 DIAGNOSIS — I2510 Atherosclerotic heart disease of native coronary artery without angina pectoris: Secondary | ICD-10-CM | POA: Diagnosis not present

## 2021-02-10 DIAGNOSIS — I440 Atrioventricular block, first degree: Secondary | ICD-10-CM | POA: Diagnosis not present

## 2021-02-10 LAB — COLOGUARD: Cologuard: NEGATIVE

## 2021-03-13 ENCOUNTER — Encounter: Payer: Self-pay | Admitting: Internal Medicine

## 2021-03-13 NOTE — Progress Notes (Signed)
Cologuard results already been abstracted

## 2021-03-16 ENCOUNTER — Encounter: Payer: Self-pay | Admitting: *Deleted

## 2021-03-16 ENCOUNTER — Other Ambulatory Visit: Payer: Self-pay

## 2021-03-17 ENCOUNTER — Telehealth: Payer: Self-pay | Admitting: Internal Medicine

## 2021-03-17 NOTE — Telephone Encounter (Signed)
LVM for pt to rtn my call to schedule AWV with [facility]. Please schedule AWV if pt calls the office  

## 2021-04-29 DIAGNOSIS — J180 Bronchopneumonia, unspecified organism: Secondary | ICD-10-CM | POA: Diagnosis not present

## 2021-04-29 DIAGNOSIS — R051 Acute cough: Secondary | ICD-10-CM | POA: Diagnosis not present

## 2021-04-29 DIAGNOSIS — Z20822 Contact with and (suspected) exposure to covid-19: Secondary | ICD-10-CM | POA: Diagnosis not present

## 2021-05-18 DIAGNOSIS — I10 Essential (primary) hypertension: Secondary | ICD-10-CM | POA: Diagnosis not present

## 2021-05-18 DIAGNOSIS — I2510 Atherosclerotic heart disease of native coronary artery without angina pectoris: Secondary | ICD-10-CM | POA: Diagnosis not present

## 2021-05-18 DIAGNOSIS — R931 Abnormal findings on diagnostic imaging of heart and coronary circulation: Secondary | ICD-10-CM | POA: Diagnosis not present

## 2021-05-18 DIAGNOSIS — Z789 Other specified health status: Secondary | ICD-10-CM | POA: Diagnosis not present

## 2021-05-18 DIAGNOSIS — E038 Other specified hypothyroidism: Secondary | ICD-10-CM | POA: Diagnosis not present

## 2021-05-18 DIAGNOSIS — E789 Disorder of lipoprotein metabolism, unspecified: Secondary | ICD-10-CM | POA: Diagnosis not present

## 2021-05-27 DIAGNOSIS — I781 Nevus, non-neoplastic: Secondary | ICD-10-CM | POA: Diagnosis not present

## 2021-05-27 DIAGNOSIS — L570 Actinic keratosis: Secondary | ICD-10-CM | POA: Diagnosis not present

## 2021-06-01 ENCOUNTER — Other Ambulatory Visit: Payer: Self-pay | Admitting: Internal Medicine

## 2021-06-01 ENCOUNTER — Other Ambulatory Visit: Payer: Self-pay

## 2021-06-01 ENCOUNTER — Ambulatory Visit (INDEPENDENT_AMBULATORY_CARE_PROVIDER_SITE_OTHER): Payer: Medicare Other | Admitting: Internal Medicine

## 2021-06-01 ENCOUNTER — Encounter: Payer: Self-pay | Admitting: Internal Medicine

## 2021-06-01 DIAGNOSIS — E034 Atrophy of thyroid (acquired): Secondary | ICD-10-CM

## 2021-06-01 DIAGNOSIS — K5900 Constipation, unspecified: Secondary | ICD-10-CM | POA: Diagnosis not present

## 2021-06-01 LAB — TSH: TSH: 8.24 u[IU]/mL — ABNORMAL HIGH (ref 0.35–5.50)

## 2021-06-01 LAB — T4, FREE: Free T4: 1.08 ng/dL (ref 0.60–1.60)

## 2021-06-01 NOTE — Patient Instructions (Signed)
Senakot-S daily

## 2021-06-01 NOTE — Assessment & Plan Note (Addendum)
1/23 new Last colon 2008 and ?year in [place] Last (-) cologuard 9/22 GI ref - pt wants  Use stool softner: Try Senakot-S daily TSH, FT4

## 2021-06-01 NOTE — Assessment & Plan Note (Signed)
Check FT4, TSH

## 2021-06-01 NOTE — Progress Notes (Signed)
Subjective:  Patient ID: Donald Fillers., male    DOB: [birth year]  Age: 76 y.o. MRN: [id number]  CC: Constipation   HPI Donald Trimm. presents for a bowel habit change. C/o a new onset constipation x 3 weeks.     Outpatient Medications Prior to Visit  Medication Sig Dispense Refill   Biotin 5000 MCG TABS Take 1 tablet by mouth daily.     buPROPion (WELLBUTRIN XL) 300 MG 24 hr tablet Take 1 tablet by mouth once daily 90 tablet 3   Cholecalciferol (VITAMIN D3) 1000 UNITS tablet Take 1,000 Units by mouth daily.     Cyanocobalamin (VITAMIN B-12) 1000 MCG SUBL Place 1 tablet (1,000 mcg total) under the tongue daily. 100 tablet 3   fenofibrate (TRICOR) 145 MG tablet Take 1 tablet by mouth once daily 90 tablet 3   levothyroxine (SYNTHROID) 150 MCG tablet TAKE 1 TABLET BY MOUTH EVERY OTHER DAY ALTERNTING  WITH  1/2  TABLET  EVERY  OTHER  DAY  30  MINUTES  BEFORE  BREAKFAST 68 tablet 5   terazosin (HYTRIN) 5 MG capsule Take 1 capsule by mouth at bedtime 90 capsule 0   Turmeric Curcumin 500 MG CAPS Take by mouth 1 day or 1 dose.     zinc gluconate 50 MG tablet Take 50 mg by mouth daily.     sildenafil (VIAGRA) 100 MG tablet Take 1 tablet (100 mg total) by mouth as needed for erectile dysfunction. 30 tablet 5   No facility-administered medications prior to visit.    ROS: Review of Systems  Constitutional:  Negative for appetite change, fatigue and unexpected weight change.  HENT:  Negative for congestion, nosebleeds, sneezing, sore throat and trouble swallowing.   Eyes:  Negative for itching and visual disturbance.  Respiratory:  Negative for cough.   Cardiovascular:  Negative for chest pain, palpitations and leg swelling.  Gastrointestinal:  Positive for constipation. Negative for abdominal distention, abdominal pain, anal bleeding, blood in stool, diarrhea, nausea and rectal pain.  Genitourinary:  Negative for frequency and hematuria.  Musculoskeletal:  Negative for back pain,  gait problem, joint swelling and neck pain.  Skin:  Negative for rash.  Neurological:  Negative for dizziness, tremors, speech difficulty and weakness.  Psychiatric/Behavioral:  Negative for agitation, dysphoric mood and sleep disturbance. The patient is not nervous/anxious.    Objective:  BP 118/72 (BP Location: Left Arm)    Pulse 65    Temp 97.9 F (36.6 C) (Oral)    Ht 5\' 9"  (1.753 m)    Wt 201 lb (91.2 kg)    SpO2 96%    BMI 29.68 kg/m   BP Readings from Last 3 Encounters:  06/01/21 118/72  01/22/21 130/70  07/16/20 120/68    Wt Readings from Last 3 Encounters:  06/01/21 201 lb (91.2 kg)  01/22/21 203 lb 6.4 oz (92.3 kg)  07/16/20 205 lb 3.2 oz (93.1 kg)    Physical Exam Constitutional:      General: He is not in acute distress.    Appearance: He is well-developed.     Comments: NAD  Eyes:     Conjunctiva/sclera: Conjunctivae normal.     Pupils: Pupils are equal, round, and reactive to light.  Neck:     Thyroid: No thyromegaly.     Vascular: No JVD.  Cardiovascular:     Rate and Rhythm: Normal rate and regular rhythm.     Heart sounds: Normal heart sounds. No murmur heard.  No friction rub. No gallop.  Pulmonary:     Effort: Pulmonary effort is normal. No respiratory distress.     Breath sounds: Normal breath sounds. No wheezing or rales.  Chest:     Chest wall: No tenderness.  Abdominal:     General: Bowel sounds are normal. There is no distension.     Palpations: Abdomen is soft. There is no mass.     Tenderness: There is no abdominal tenderness. There is no guarding or rebound.  Genitourinary:    Rectum: Normal. Guaiac result negative.  Musculoskeletal:        General: No tenderness. Normal range of motion.     Cervical back: Normal range of motion.  Lymphadenopathy:     Cervical: No cervical adenopathy.  Skin:    General: Skin is warm and dry.     Findings: No rash.  Neurological:     Mental Status: He is alert and oriented to person, place, and time.      Cranial Nerves: No cranial nerve deficit.     Motor: No abnormal muscle tone.     Coordination: Coordination normal.     Gait: Gait normal.     Deep Tendon Reflexes: Reflexes are normal and symmetric.  Psychiatric:        Behavior: Behavior normal.        Thought Content: Thought content normal.        Judgment: Judgment normal.  Prostate 1-2+  Lab Results  Component Value Date   WBC 6.8 12/25/2018   HGB 15.6 12/25/2018   HCT 46.9 12/25/2018   PLT 279.0 12/25/2018   GLUCOSE 93 01/22/2021   CHOL 201 (H) 01/22/2021   TRIG 118.0 01/22/2021   HDL 49.50 01/22/2021   LDLDIRECT 117.0 09/04/2015   LDLCALC 128 (H) 01/22/2021   ALT 16 01/22/2021   AST 20 01/22/2021   NA 141 01/22/2021   K 4.1 01/22/2021   CL 107 01/22/2021   CREATININE 1.35 01/22/2021   BUN 20 01/22/2021   CO2 28 01/22/2021   TSH 20.87 (H) 01/22/2021   PSA 2.82 07/16/2020   INR 1.00 10/04/2011   HGBA1C 5.7 07/03/2018    US RENAL  Result Date: 06/15/2019 CLINICAL DATA:  CKD stage G3B/A1 EXAM: RENAL / URINARY TRACT ULTRASOUND COMPLETE COMPARISON:  MR abdomen 03/21/2018, ultrasound 03/03/2018 FINDINGS: Right Kidney: Renal measurements: 11.2 x 4.9 x 5.0 cm = volume: 143 mL. There are multiple anechoic simple cyst present throughout the right kidney. Septate cyst in the anterior interpolar right kidney measuring 3.5 x 2.4 x 2.2 cm visualized on comparison MR and not significantly changed in size. Additional 1.9 x 1.7 x 1.5 cm cyst in the upper pole some questionable low-level internal echoes but overall demonstrating reduced complexity from comparison ultrasound in 2019. No solid renal lesions. No urolithiasis or visible hydronephrosis. Left Kidney: Renal measurements: 11.5 x 5.5 x 5.3 cm = volume: 174 mL. Minimally complex cyst in the interpolar left kidney measuring 3.0 x 3.1 x 4.1 cm with septations on prior MR but no concerning features. Additional larger exophytic cyst arising from the lower pole left kidney  measuring 4.6 x 4.5 x 4.5 cm. No new concerning renal lesions. No urolithiasis or hydronephrosis. Bladder: Only partially distended at the time of exam. No concerning bladder abnormality. Other: Prostatomegaly with the prostate measuring 5.3 x 5.0 x 7.1 cm for an estimated prostate volume of 100 cc. IMPRESSION: Numerous bilateral renal cysts, some of which demonstrate a small amount of internal complexity  but appear benign on MR correlate. No new concerning renal lesions. No urolithiasis or hydronephrosis. Prostatomegaly. Electronically Signed   By: Lovena Le M.D.   On: 06/15/2019 23:39    Assessment & Plan:   Problem List Items Addressed This Visit     Constipation    1/23 new Last colon 2008 and ?year in [place] Last (-) cologuard 9/22 GI ref - pt wants  Use stool softner: Try Senakot-S daily TSH, FT4      Relevant Orders   Ambulatory referral to Gastroenterology   T4, free   TSH   Hypothyroidism    Check FT4, TSH         No orders of the defined types were placed in this encounter.     Follow-up: Return for a follow-up visit.  Donald Kehr, MD

## 2021-07-15 DIAGNOSIS — N200 Calculus of kidney: Secondary | ICD-10-CM | POA: Diagnosis not present

## 2021-07-15 DIAGNOSIS — E785 Hyperlipidemia, unspecified: Secondary | ICD-10-CM | POA: Diagnosis not present

## 2021-07-15 DIAGNOSIS — I129 Hypertensive chronic kidney disease with stage 1 through stage 4 chronic kidney disease, or unspecified chronic kidney disease: Secondary | ICD-10-CM | POA: Diagnosis not present

## 2021-07-15 DIAGNOSIS — N1832 Chronic kidney disease, stage 3b: Secondary | ICD-10-CM | POA: Diagnosis not present

## 2021-07-15 DIAGNOSIS — E039 Hypothyroidism, unspecified: Secondary | ICD-10-CM | POA: Diagnosis not present

## 2021-07-22 ENCOUNTER — Ambulatory Visit (INDEPENDENT_AMBULATORY_CARE_PROVIDER_SITE_OTHER): Payer: Medicare Other | Admitting: Internal Medicine

## 2021-07-22 ENCOUNTER — Encounter: Payer: Self-pay | Admitting: Internal Medicine

## 2021-07-22 ENCOUNTER — Other Ambulatory Visit: Payer: Self-pay

## 2021-07-22 DIAGNOSIS — K5900 Constipation, unspecified: Secondary | ICD-10-CM

## 2021-07-22 DIAGNOSIS — R931 Abnormal findings on diagnostic imaging of heart and coronary circulation: Secondary | ICD-10-CM

## 2021-07-22 MED ORDER — SENNOSIDES-DOCUSATE SODIUM 8.6-50 MG PO TABS: 2.0000 | ORAL_TABLET | Freq: Two times a day (BID) | ORAL | 6 refills | Status: AC

## 2021-07-22 MED ORDER — TRULANCE 3 MG PO TABS: 3.0000 mg | ORAL_TABLET | Freq: Every day | ORAL | 11 refills | Status: AC

## 2021-07-22 NOTE — Patient Instructions (Addendum)
Try Senakot-S daily ?Stop Fenofibrate for now ?We can try Trulance if not too expensive ?Walk 5000 steps a day at least ?

## 2021-07-22 NOTE — Assessment & Plan Note (Signed)
GI ref - Dr Roney Mans in May 2023 ?Not taking Lipitor due to Flu lke sx's ?Use stool softner: Try Senakot-S daily ?Stop Fenofibrate for now ?We can try Trulance ?

## 2021-07-22 NOTE — Assessment & Plan Note (Signed)
Not taking Lipitor due to Flu lke sx's ? ?Stop Fenofibrate for now ? ?

## 2021-07-22 NOTE — Progress Notes (Signed)
? ?Subjective:  ?Patient ID: Donald Fillers., male    DOB: [birth year]  Age: 76 y.o. MRN: [id number] ? ?CC: No chief complaint on file. ? ? ?HPI ?Donald Fillers. presents for constipation f/u. He has to strain a lot ?F/u dyslipidemia ? ?Outpatient Medications Prior to Visit  ?Medication Sig Dispense Refill  ? Biotin 5000 MCG TABS Take 1 tablet by mouth daily.    ? buPROPion (WELLBUTRIN XL) 300 MG 24 hr tablet Take 1 tablet by mouth once daily 90 tablet 3  ? Cholecalciferol (VITAMIN D3) 1000 UNITS tablet Take 1,000 Units by mouth daily.    ? Cyanocobalamin (VITAMIN B-12) 1000 MCG SUBL Place 1 tablet (1,000 mcg total) under the tongue daily. 100 tablet 3  ? fenofibrate (TRICOR) 145 MG tablet Take 1 tablet by mouth once daily 90 tablet 3  ? levothyroxine (SYNTHROID) 150 MCG tablet TAKE 1 TABLET BY MOUTH EVERY OTHER DAY ALTERNTING  WITH  1/2  TABLET  EVERY  OTHER  DAY  30  MINUTES  BEFORE  BREAKFAST 68 tablet 5  ? terazosin (HYTRIN) 5 MG capsule Take 1 capsule by mouth at bedtime 90 capsule 0  ? Turmeric Curcumin 500 MG CAPS Take by mouth 1 day or 1 dose.    ? zinc gluconate 50 MG tablet Take 50 mg by mouth daily.    ? atorvastatin (LIPITOR) 40 MG tablet Take 1 tablet by mouth daily.    ? sildenafil (VIAGRA) 100 MG tablet Take 1 tablet (100 mg total) by mouth as needed for erectile dysfunction. 30 tablet 5  ? ?No facility-administered medications prior to visit.  ? ? ?ROS: ?Review of Systems  ?Constitutional:  Negative for appetite change, fatigue and unexpected weight change.  ?HENT:  Negative for congestion, nosebleeds, sneezing, sore throat and trouble swallowing.   ?Eyes:  Negative for itching and visual disturbance.  ?Respiratory:  Negative for cough.   ?Cardiovascular:  Negative for chest pain, palpitations and leg swelling.  ?Gastrointestinal:  Positive for constipation. Negative for abdominal distention, blood in stool, diarrhea and nausea.  ?Genitourinary:  Negative for frequency and hematuria.   ?Musculoskeletal:  Negative for back pain, gait problem, joint swelling and neck pain.  ?Skin:  Negative for rash.  ?Neurological:  Negative for dizziness, tremors, speech difficulty and weakness.  ?Psychiatric/Behavioral:  Negative for agitation, dysphoric mood and sleep disturbance. The patient is not nervous/anxious.   ? ?Objective:  ?BP 130/78 (BP Location: Left Arm, Patient Position: Sitting, Cuff Size: Large)   Pulse 64   Temp 98.1 ?F (36.7 ?C) (Oral)   Ht '5\' 9"'$  (1.753 m)   Wt 203 lb (92.1 kg)   SpO2 95%   BMI 29.98 kg/m?  ? ?BP Readings from Last 3 Encounters:  ?07/22/21 130/78  ?06/01/21 118/72  ?01/22/21 130/70  ? ? ?Wt Readings from Last 3 Encounters:  ?07/22/21 203 lb (92.1 kg)  ?06/01/21 201 lb (91.2 kg)  ?01/22/21 203 lb 6.4 oz (92.3 kg)  ? ? ?Physical Exam ?Constitutional:   ?   General: He is not in acute distress. ?   Appearance: He is well-developed.  ?   Comments: NAD  ?Eyes:  ?   Conjunctiva/sclera: Conjunctivae normal.  ?   Pupils: Pupils are equal, round, and reactive to light.  ?Neck:  ?   Thyroid: No thyromegaly.  ?   Vascular: No JVD.  ?Cardiovascular:  ?   Rate and Rhythm: Normal rate and regular rhythm.  ?   Heart sounds: Normal  heart sounds. No murmur heard. ?  No friction rub. No gallop.  ?Pulmonary:  ?   Effort: Pulmonary effort is normal. No respiratory distress.  ?   Breath sounds: Normal breath sounds. No wheezing or rales.  ?Chest:  ?   Chest wall: No tenderness.  ?Abdominal:  ?   General: Bowel sounds are normal. There is no distension.  ?   Palpations: Abdomen is soft. There is no mass.  ?   Tenderness: There is no abdominal tenderness. There is no guarding or rebound.  ?Musculoskeletal:     ?   General: No tenderness. Normal range of motion.  ?   Cervical back: Normal range of motion.  ?Lymphadenopathy:  ?   Cervical: No cervical adenopathy.  ?Skin: ?   General: Skin is warm and dry.  ?   Findings: No rash.  ?Neurological:  ?   Mental Status: He is alert and oriented to  person, place, and time.  ?   Cranial Nerves: No cranial nerve deficit.  ?   Motor: No abnormal muscle tone.  ?   Coordination: Coordination normal.  ?   Gait: Gait normal.  ?   Deep Tendon Reflexes: Reflexes are normal and symmetric.  ?Psychiatric:     ?   Behavior: Behavior normal.     ?   Thought Content: Thought content normal.     ?   Judgment: Judgment normal.  ? ? ?Lab Results  ?Component Value Date  ? WBC 6.8 12/25/2018  ? HGB 15.6 12/25/2018  ? HCT 46.9 12/25/2018  ? PLT 279.0 12/25/2018  ? GLUCOSE 93 01/22/2021  ? CHOL 201 (H) 01/22/2021  ? TRIG 118.0 01/22/2021  ? HDL 49.50 01/22/2021  ? LDLDIRECT 117.0 09/04/2015  ? LDLCALC 128 (H) 01/22/2021  ? ALT 16 01/22/2021  ? AST 20 01/22/2021  ? NA 141 01/22/2021  ? K 4.1 01/22/2021  ? CL 107 01/22/2021  ? CREATININE 1.35 01/22/2021  ? BUN 20 01/22/2021  ? CO2 28 01/22/2021  ? TSH 8.24 (H) 06/01/2021  ? PSA 2.82 07/16/2020  ? INR 1.00 10/04/2011  ? HGBA1C 5.7 07/03/2018  ? ? ?US RENAL ? ?Result Date: 06/15/2019 ?CLINICAL DATA:  CKD stage G3B/A1 EXAM: RENAL / URINARY TRACT ULTRASOUND COMPLETE COMPARISON:  MR abdomen 03/21/2018, ultrasound 03/03/2018 FINDINGS: Right Kidney: Renal measurements: 11.2 x 4.9 x 5.0 cm = volume: 143 mL. There are multiple anechoic simple cyst present throughout the right kidney. Septate cyst in the anterior interpolar right kidney measuring 3.5 x 2.4 x 2.2 cm visualized on comparison MR and not significantly changed in size. Additional 1.9 x 1.7 x 1.5 cm cyst in the upper pole some questionable low-level internal echoes but overall demonstrating reduced complexity from comparison ultrasound in 2019. No solid renal lesions. No urolithiasis or visible hydronephrosis. Left Kidney: Renal measurements: 11.5 x 5.5 x 5.3 cm = volume: 174 mL. Minimally complex cyst in the interpolar left kidney measuring 3.0 x 3.1 x 4.1 cm with septations on prior MR but no concerning features. Additional larger exophytic cyst arising from the lower pole left  kidney measuring 4.6 x 4.5 x 4.5 cm. No new concerning renal lesions. No urolithiasis or hydronephrosis. Bladder: Only partially distended at the time of exam. No concerning bladder abnormality. Other: Prostatomegaly with the prostate measuring 5.3 x 5.0 x 7.1 cm for an estimated prostate volume of 100 cc. IMPRESSION: Numerous bilateral renal cysts, some of which demonstrate a small amount of internal complexity but appear benign on  MR correlate. No new concerning renal lesions. No urolithiasis or hydronephrosis. Prostatomegaly. Electronically Signed   By: Lovena Le M.D.   On: 06/15/2019 23:39  ? ? ?Assessment & Plan:  ? ?Problem List Items Addressed This Visit   ? ? Constipation  ?  GI ref - Dr Roney Mans in May 2023 ?Not taking Lipitor due to Flu lke sx's ?Use stool softner: Try Senakot-S daily ?Stop Fenofibrate for now ?We can try Trulance ?  ?  ? Elevated coronary artery calcium score  ?  Not taking Lipitor due to Flu lke sx's ? ?Stop Fenofibrate for now ? ?  ?  ?  ? ? ?Meds ordered this encounter  ?Medications  ? Plecanatide (TRULANCE) 3 MG TABS  ?  Sig: Take 3 mg by mouth daily. For constipation  ?  Dispense:  30 tablet  ?  Refill:  11  ? senna-docusate (SENOKOT-S) 8.6-50 MG tablet  ?  Sig: Take 2 tablets by mouth 2 (two) times daily.  ?  Dispense:  100 tablet  ?  Refill:  6  ?  ? ? ?Follow-up: No follow-ups on file. ? ?Walker Kehr, MD ?

## 2021-07-23 ENCOUNTER — Telehealth: Payer: Self-pay

## 2021-07-23 NOTE — Telephone Encounter (Signed)
PA started for Trulance 3 MG tablets. ? ?Key: [id number] ?

## 2021-08-03 DIAGNOSIS — N189 Chronic kidney disease, unspecified: Secondary | ICD-10-CM | POA: Diagnosis not present

## 2021-08-03 DIAGNOSIS — N281 Cyst of kidney, acquired: Secondary | ICD-10-CM | POA: Diagnosis not present

## 2021-08-03 DIAGNOSIS — N200 Calculus of kidney: Secondary | ICD-10-CM | POA: Diagnosis not present

## 2021-08-24 DIAGNOSIS — D485 Neoplasm of uncertain behavior of skin: Secondary | ICD-10-CM | POA: Diagnosis not present

## 2021-08-24 DIAGNOSIS — D2272 Melanocytic nevi of left lower limb, including hip: Secondary | ICD-10-CM | POA: Diagnosis not present

## 2021-08-24 DIAGNOSIS — D0472 Carcinoma in situ of skin of left lower limb, including hip: Secondary | ICD-10-CM | POA: Diagnosis not present

## 2021-08-30 ENCOUNTER — Other Ambulatory Visit: Payer: Self-pay | Admitting: Internal Medicine

## 2021-09-08 DIAGNOSIS — D0472 Carcinoma in situ of skin of left lower limb, including hip: Secondary | ICD-10-CM | POA: Diagnosis not present

## 2021-09-08 DIAGNOSIS — C4490 Unspecified malignant neoplasm of skin, unspecified: Secondary | ICD-10-CM | POA: Insufficient documentation

## 2021-09-08 DIAGNOSIS — C44729 Squamous cell carcinoma of skin of left lower limb, including hip: Secondary | ICD-10-CM | POA: Diagnosis not present

## 2021-09-09 DIAGNOSIS — C44721 Squamous cell carcinoma of skin of unspecified lower limb, including hip: Secondary | ICD-10-CM | POA: Insufficient documentation

## 2021-09-11 ENCOUNTER — Other Ambulatory Visit: Payer: Self-pay | Admitting: Internal Medicine

## 2021-10-07 DIAGNOSIS — Z8719 Personal history of other diseases of the digestive system: Secondary | ICD-10-CM | POA: Diagnosis not present

## 2021-10-07 DIAGNOSIS — Z09 Encounter for follow-up examination after completed treatment for conditions other than malignant neoplasm: Secondary | ICD-10-CM | POA: Diagnosis not present

## 2021-10-07 DIAGNOSIS — K5909 Other constipation: Secondary | ICD-10-CM | POA: Diagnosis not present

## 2021-10-07 DIAGNOSIS — K5900 Constipation, unspecified: Secondary | ICD-10-CM | POA: Diagnosis not present

## 2021-11-03 ENCOUNTER — Other Ambulatory Visit: Payer: Self-pay | Admitting: Internal Medicine

## 2021-11-19 DIAGNOSIS — M79642 Pain in left hand: Secondary | ICD-10-CM | POA: Diagnosis not present

## 2021-11-19 DIAGNOSIS — S60512A Abrasion of left hand, initial encounter: Secondary | ICD-10-CM | POA: Diagnosis not present

## 2021-11-30 DIAGNOSIS — L821 Other seborrheic keratosis: Secondary | ICD-10-CM | POA: Diagnosis not present

## 2021-11-30 DIAGNOSIS — D0359 Melanoma in situ of other part of trunk: Secondary | ICD-10-CM | POA: Diagnosis not present

## 2021-11-30 DIAGNOSIS — D485 Neoplasm of uncertain behavior of skin: Secondary | ICD-10-CM | POA: Diagnosis not present

## 2021-12-29 DIAGNOSIS — D0359 Melanoma in situ of other part of trunk: Secondary | ICD-10-CM | POA: Diagnosis not present

## 2021-12-29 DIAGNOSIS — C4359 Malignant melanoma of other part of trunk: Secondary | ICD-10-CM | POA: Diagnosis not present

## 2021-12-29 DIAGNOSIS — D045 Carcinoma in situ of skin of trunk: Secondary | ICD-10-CM | POA: Diagnosis not present

## 2022-01-03 DIAGNOSIS — D045 Carcinoma in situ of skin of trunk: Secondary | ICD-10-CM | POA: Insufficient documentation

## 2022-01-03 DIAGNOSIS — C801 Malignant (primary) neoplasm, unspecified: Secondary | ICD-10-CM | POA: Insufficient documentation

## 2022-01-18 DIAGNOSIS — C4359 Malignant melanoma of other part of trunk: Secondary | ICD-10-CM | POA: Diagnosis not present

## 2022-01-18 DIAGNOSIS — D0359 Melanoma in situ of other part of trunk: Secondary | ICD-10-CM | POA: Diagnosis not present

## 2022-01-18 DIAGNOSIS — C4490 Unspecified malignant neoplasm of skin, unspecified: Secondary | ICD-10-CM | POA: Diagnosis not present

## 2022-02-18 ENCOUNTER — Ambulatory Visit (INDEPENDENT_AMBULATORY_CARE_PROVIDER_SITE_OTHER): Payer: Medicare Other

## 2022-02-18 DIAGNOSIS — Z23 Encounter for immunization: Secondary | ICD-10-CM | POA: Diagnosis not present

## 2022-02-18 NOTE — Progress Notes (Signed)
After obtaining consent, and per orders of Dr. Alain Marion, injection of High Dose Flu shot was given in the right deltoid by Marrian Salvage. Patient tolerated well and instructed to report any adverse reaction to me immediately.

## 2022-03-10 ENCOUNTER — Telehealth: Payer: Self-pay | Admitting: Internal Medicine

## 2022-03-10 DIAGNOSIS — N1832 Chronic kidney disease, stage 3b: Secondary | ICD-10-CM | POA: Diagnosis not present

## 2022-03-10 DIAGNOSIS — I129 Hypertensive chronic kidney disease with stage 1 through stage 4 chronic kidney disease, or unspecified chronic kidney disease: Secondary | ICD-10-CM | POA: Diagnosis not present

## 2022-03-10 DIAGNOSIS — E039 Hypothyroidism, unspecified: Secondary | ICD-10-CM | POA: Diagnosis not present

## 2022-03-10 LAB — CBC AND DIFFERENTIAL
HCT: 43 (ref 41–53)
Hemoglobin: 14 (ref 13.5–17.5)
Platelets: 231 10*3/uL (ref 150–400)
WBC: 6.2

## 2022-03-10 LAB — COMPREHENSIVE METABOLIC PANEL
Albumin: 4.3 (ref 3.5–5.0)
Calcium: 10 (ref 8.7–10.7)
eGFR: 72

## 2022-03-10 LAB — CBC: RBC: 4.45 (ref 3.87–5.11)

## 2022-03-10 LAB — BASIC METABOLIC PANEL
BUN: 17 (ref 4–21)
CO2: 26 — AB (ref 13–22)
Chloride: 104 (ref 99–108)
Creatinine: 1.1 (ref 0.6–1.3)
Glucose: 84
Potassium: 4.9 mEq/L (ref 3.5–5.1)
Sodium: 142 (ref 137–147)

## 2022-03-10 NOTE — Telephone Encounter (Signed)
Line was busy, unable to leave a message for patient to call back to schedule Medicare Annual Wellness Visit   Last AWV  01/10/20  Please schedule at anytime with [facility] if patient calls the office back.     Any questions, please call me at [phone]

## 2022-03-17 ENCOUNTER — Other Ambulatory Visit: Payer: Self-pay | Admitting: Internal Medicine

## 2022-03-17 ENCOUNTER — Encounter: Payer: Self-pay | Admitting: Internal Medicine

## 2022-03-18 ENCOUNTER — Other Ambulatory Visit: Payer: Self-pay | Admitting: Nephrology

## 2022-03-18 ENCOUNTER — Ambulatory Visit
Admission: RE | Admit: 2022-03-18 | Discharge: 2022-03-18 | Disposition: A | Discharge status: Home / Self Care | Payer: Medicare Other | Source: Ambulatory Visit | Attending: Nephrology | Admitting: Nephrology

## 2022-03-18 DIAGNOSIS — N189 Chronic kidney disease, unspecified: Secondary | ICD-10-CM | POA: Diagnosis not present

## 2022-03-18 DIAGNOSIS — N1832 Chronic kidney disease, stage 3b: Secondary | ICD-10-CM

## 2022-03-29 ENCOUNTER — Encounter: Payer: Self-pay | Admitting: Internal Medicine

## 2022-03-29 ENCOUNTER — Ambulatory Visit (INDEPENDENT_AMBULATORY_CARE_PROVIDER_SITE_OTHER): Payer: Medicare Other | Admitting: Internal Medicine

## 2022-03-29 VITALS — BP 132/72 | HR 60 | Temp 97.8°F | Ht 69.0 in | Wt 201.0 lb

## 2022-03-29 DIAGNOSIS — E559 Vitamin D deficiency, unspecified: Secondary | ICD-10-CM

## 2022-03-29 DIAGNOSIS — N320 Bladder-neck obstruction: Secondary | ICD-10-CM | POA: Diagnosis not present

## 2022-03-29 DIAGNOSIS — R931 Abnormal findings on diagnostic imaging of heart and coronary circulation: Secondary | ICD-10-CM

## 2022-03-29 DIAGNOSIS — E785 Hyperlipidemia, unspecified: Secondary | ICD-10-CM | POA: Diagnosis not present

## 2022-03-29 DIAGNOSIS — E538 Deficiency of other specified B group vitamins: Secondary | ICD-10-CM | POA: Diagnosis not present

## 2022-03-29 LAB — URINALYSIS
Bilirubin Urine: NEGATIVE
Ketones, ur: NEGATIVE
Leukocytes,Ua: NEGATIVE
Nitrite: NEGATIVE
Specific Gravity, Urine: 1.02 (ref 1.000–1.030)
Total Protein, Urine: NEGATIVE
Urine Glucose: NEGATIVE
Urobilinogen, UA: 0.2 (ref 0.0–1.0)
pH: 6.5 (ref 5.0–8.0)

## 2022-03-29 LAB — LIPID PANEL
Cholesterol: 225 mg/dL — ABNORMAL HIGH (ref 0–200)
HDL: 48 mg/dL (ref >39.00)
LDL Cholesterol: 155 mg/dL — ABNORMAL HIGH (ref 0–99)
NonHDL: 176.77
Total CHOL/HDL Ratio: 5
Triglycerides: 107 mg/dL (ref 0.0–149.0)
VLDL: 21.4 mg/dL (ref 0.0–40.0)

## 2022-03-29 LAB — COMPREHENSIVE METABOLIC PANEL
ALT: 14 U/L (ref 0–53)
AST: 20 U/L (ref 0–37)
Albumin: 4.2 g/dL (ref 3.5–5.2)
Alkaline Phosphatase: 66 U/L (ref 39–117)
BUN: 15 mg/dL (ref 6–23)
CO2: 29 mEq/L (ref 19–32)
Calcium: 9.1 mg/dL (ref 8.4–10.5)
Chloride: 106 mEq/L (ref 96–112)
Creatinine, Ser: 1.18 mg/dL (ref 0.40–1.50)
GFR: 60.5 mL/min (ref >60.00)
Glucose, Bld: 96 mg/dL (ref 70–99)
Potassium: 4.5 mEq/L (ref 3.5–5.1)
Sodium: 141 mEq/L (ref 135–145)
Total Bilirubin: 0.6 mg/dL (ref 0.2–1.2)
Total Protein: 6.9 g/dL (ref 6.0–8.3)

## 2022-03-29 LAB — PSA: PSA: 3.68 ng/mL (ref 0.10–4.00)

## 2022-03-29 LAB — T4, FREE: Free T4: 0.89 ng/dL (ref 0.60–1.60)

## 2022-03-29 LAB — TSH: TSH: 30.66 u[IU]/mL — ABNORMAL HIGH (ref 0.35–5.50)

## 2022-03-29 MED ORDER — TERAZOSIN HCL 5 MG PO CAPS: 5.0000 mg | ORAL_CAPSULE | Freq: Every day | ORAL | 3 refills | Status: DC

## 2022-03-29 NOTE — Assessment & Plan Note (Signed)
On Vit D 

## 2022-03-29 NOTE — Assessment & Plan Note (Signed)
On B12 

## 2022-03-29 NOTE — Progress Notes (Signed)
Subjective:  Patient ID: Donald Phillips., male    DOB: 1945/08/05  Age: 76 y.o. MRN: [id number]  CC: Medication Refill   HPI Josyah Achor. 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     Biotin 5000 MCG TABS Take 1 tablet by mouth daily.     buPROPion (WELLBUTRIN XL) 300 MG 24 hr tablet Take 1 tablet by mouth once daily 90 tablet 3   Cholecalciferol (VITAMIN D3) 1000 UNITS tablet Take 1,000 Units by mouth daily.     Cyanocobalamin (VITAMIN B-12) 1000 MCG SUBL Place 1 tablet (1,000 mcg total) under the tongue daily. 100 tablet 3   fenofibrate (TRICOR) 145 MG tablet Take 1 tablet by mouth once daily 90 tablet 1   levothyroxine (SYNTHROID) 150 MCG tablet TAKE 1 TABLET BY MOUTH EVERY OTHER DAY ALTERNATING WITH 1/2 TABLET EVERY OTHER DAY , 30 MINUTES BEFORE BREAKFAST. 68 tablet 5   Plecanatide (TRULANCE) 3 MG TABS Take 3 mg by mouth daily. For constipation 30 tablet 11   senna-docusate (SENOKOT-S) 8.6-50 MG tablet Take 2 tablets by mouth 2 (two) times daily. 100 tablet 6   Turmeric Curcumin 500 MG CAPS Take by mouth 1 day or 1 dose.     zinc gluconate 50 MG tablet Take 50 mg by mouth daily.     terazosin (HYTRIN) 5 MG capsule Take 1 capsule (5 mg total) by mouth at bedtime. Annual appt is due must see provider for future refills 30 capsule 0   sildenafil (VIAGRA) 100 MG tablet Take 1 tablet (100 mg total) by mouth as needed for erectile dysfunction. 30 tablet 5   No facility-administered medications prior to visit.    ROS: Review of Systems  Constitutional:  Negative for appetite change, fatigue and unexpected weight change.  HENT:  Negative for congestion, nosebleeds, sneezing, sore throat and trouble swallowing.   Eyes:  Negative for itching and visual disturbance.  Respiratory:   Negative for cough.   Cardiovascular:  Negative for chest pain, palpitations and leg swelling.  Gastrointestinal:  Negative for abdominal distention, blood in stool, diarrhea and nausea.  Genitourinary:  Negative for frequency and hematuria.  Musculoskeletal:  Negative for back pain, gait problem, joint swelling and neck pain.  Skin:  Negative for rash.  Neurological:  Negative for dizziness, tremors, speech difficulty and weakness.  Psychiatric/Behavioral:  Negative for agitation, dysphoric mood and sleep disturbance. The patient is not nervous/anxious.     Objective:  BP 132/72 (BP Location: Left Arm, Patient Position: Sitting, Cuff Size: Normal)   Pulse 60   Temp 97.8 F (36.6 C) (Oral)   Ht '5\' 9"'$  (1.753 m)   Wt 201 lb (91.2 kg)   SpO2 98%   BMI 29.68 kg/m   BP Readings from Last 3 Encounters:  03/29/22 132/72  07/22/21 130/78  06/01/21 118/72    Wt Readings from Last 3 Encounters:  03/29/22 201 lb (91.2 kg)  07/22/21 203 lb (92.1 kg)  06/01/21 201 lb (91.2 kg)    Physical Exam Constitutional:      General: He is not in acute distress.    Appearance: He is well-developed.     Comments: NAD  Eyes:     Conjunctiva/sclera: Conjunctivae normal.     Pupils: Pupils are equal, round, and  reactive to light.  Neck:     Thyroid: No thyromegaly.     Vascular: No JVD.  Cardiovascular:     Rate and Rhythm: Normal rate and regular rhythm.     Heart sounds: Normal heart sounds. No murmur heard.    No friction rub. No gallop.  Pulmonary:     Effort: Pulmonary effort is normal. No respiratory distress.     Breath sounds: Normal breath sounds. No wheezing or rales.  Chest:     Chest wall: No tenderness.  Abdominal:     General: Bowel sounds are normal. There is no distension.     Palpations: Abdomen is soft. There is no mass.     Tenderness: There is no abdominal tenderness. There is no guarding or rebound.  Musculoskeletal:        General: No tenderness. Normal range of  motion.     Cervical back: Normal range of motion.  Lymphadenopathy:     Cervical: No cervical adenopathy.  Skin:    General: Skin is warm and dry.     Findings: No rash.  Neurological:     Mental Status: He is alert and oriented to person, place, and time.     Cranial Nerves: No cranial nerve deficit.     Motor: No abnormal muscle tone.     Coordination: Coordination normal.     Gait: Gait normal.     Deep Tendon Reflexes: Reflexes are normal and symmetric.  Psychiatric:        Behavior: Behavior normal.        Thought Content: Thought content normal.        Judgment: Judgment normal.   Dry eyes  Lab Results  Component Value Date   WBC 6.2 03/10/2022   HGB 14.0 03/10/2022   HCT 43 03/10/2022   PLT 231 03/10/2022   GLUCOSE 93 01/22/2021   CHOL 201 (H) 01/22/2021   TRIG 118.0 01/22/2021   HDL 49.50 01/22/2021   LDLDIRECT 117.0 09/04/2015   LDLCALC 128 (H) 01/22/2021   ALT 16 01/22/2021   AST 20 01/22/2021   NA 142 03/10/2022   K 4.9 03/10/2022   CL 104 03/10/2022   CREATININE 1.1 03/10/2022   BUN 17 03/10/2022   CO2 26 (A) 03/10/2022   TSH 8.24 (H) 06/01/2021   PSA 2.82 07/16/2020   INR 1.00 10/04/2011   HGBA1C 5.7 07/03/2018    DG Chest 2 View  Result Date: 03/21/2022 CLINICAL DATA:  Chronic renal disease. EXAM: CHEST - 2 VIEW COMPARISON:  Chest radiograph 02/19/2013 FINDINGS: The heart size and mediastinal contours are within normal limits. Both lungs are clear. The visualized skeletal structures are unremarkable. IMPRESSION: No active cardiopulmonary disease. Electronically Signed   By: Lovey Newcomer M.D.   On: 03/21/2022 19:35    Assessment & Plan:   Problem List Items Addressed This Visit     Vitamin D deficiency    On Vit D      Relevant Orders   TSH   Lipid panel   Comprehensive metabolic panel   Urinalysis   Dyslipidemia    On Fenofibrate      Relevant Orders   TSH   Lipid panel   Comprehensive metabolic panel   Urinalysis   T4, free    B12 deficiency    On B12      Bladder neck obstruction - Primary    On Hytrin at hs      Relevant Orders   PSA  Meds ordered this encounter  Medications   terazosin (HYTRIN) 5 MG capsule    Sig: Take 1 capsule (5 mg total) by mouth at bedtime.    Dispense:  90 capsule    Refill:  3      Follow-up: Return in about 6 months (around 09/27/2022) for Wellness Exam.  Walker Kehr, MD

## 2022-03-29 NOTE — Assessment & Plan Note (Signed)
On Fenofibrate

## 2022-03-29 NOTE — Assessment & Plan Note (Signed)
On Hytrin at hs

## 2022-04-13 DIAGNOSIS — H43813 Vitreous degeneration, bilateral: Secondary | ICD-10-CM | POA: Diagnosis not present

## 2022-04-13 DIAGNOSIS — H52223 Regular astigmatism, bilateral: Secondary | ICD-10-CM | POA: Diagnosis not present

## 2022-04-13 DIAGNOSIS — H04123 Dry eye syndrome of bilateral lacrimal glands: Secondary | ICD-10-CM | POA: Diagnosis not present

## 2022-04-13 DIAGNOSIS — H524 Presbyopia: Secondary | ICD-10-CM | POA: Diagnosis not present

## 2022-04-13 DIAGNOSIS — H2513 Age-related nuclear cataract, bilateral: Secondary | ICD-10-CM | POA: Diagnosis not present

## 2022-04-13 DIAGNOSIS — H47323 Drusen of optic disc, bilateral: Secondary | ICD-10-CM | POA: Diagnosis not present

## 2022-04-13 DIAGNOSIS — H1045 Other chronic allergic conjunctivitis: Secondary | ICD-10-CM | POA: Diagnosis not present

## 2022-04-14 DIAGNOSIS — H6063 Unspecified chronic otitis externa, bilateral: Secondary | ICD-10-CM | POA: Diagnosis not present

## 2022-04-14 DIAGNOSIS — H6123 Impacted cerumen, bilateral: Secondary | ICD-10-CM | POA: Diagnosis not present

## 2022-04-26 ENCOUNTER — Ambulatory Visit (INDEPENDENT_AMBULATORY_CARE_PROVIDER_SITE_OTHER): Payer: Medicare Other | Admitting: Internal Medicine

## 2022-04-26 ENCOUNTER — Encounter: Payer: Self-pay | Admitting: Internal Medicine

## 2022-04-26 VITALS — BP 122/72 | HR 67 | Temp 97.6°F | Ht 69.0 in | Wt 210.0 lb

## 2022-04-26 DIAGNOSIS — I2510 Atherosclerotic heart disease of native coronary artery without angina pectoris: Secondary | ICD-10-CM | POA: Diagnosis not present

## 2022-04-26 DIAGNOSIS — F09 Unspecified mental disorder due to known physiological condition: Secondary | ICD-10-CM | POA: Diagnosis not present

## 2022-04-26 DIAGNOSIS — E034 Atrophy of thyroid (acquired): Secondary | ICD-10-CM | POA: Diagnosis not present

## 2022-04-26 DIAGNOSIS — I2583 Coronary atherosclerosis due to lipid rich plaque: Secondary | ICD-10-CM

## 2022-04-26 NOTE — Assessment & Plan Note (Addendum)
We discussed genetic testing - I'll need to research what tests we can order.  He will ask his relative about the genetic testing she had in detail and let me know. Clinically - no signs of dementia

## 2022-04-26 NOTE — Progress Notes (Signed)
Subjective:  Patient ID: Donald Phillips., male    DOB: 1945-07-15  Age: 76 y.o. MRN: [id number]  CC: memory test (Would like to get test done)   HPI Donald Phillips. presents for memory issues per wife.  He is asking about genetic testing. GM, M had Alzheimer's  Outpatient Medications Prior to Visit  Medication Sig Dispense Refill   ascorbic acid (VITAMIN C) 500 MG tablet Take 500 mg by mouth 2 (two) times daily.     buPROPion (WELLBUTRIN XL) 300 MG 24 hr tablet Take 1 tablet by mouth once daily 90 tablet 3   Cholecalciferol (VITAMIN D3) 1000 UNITS tablet Take 1,000 Units by mouth daily.     Cyanocobalamin (VITAMIN B-12) 1000 MCG SUBL Place 1 tablet (1,000 mcg total) under the tongue daily. 100 tablet 3   DENTA 5000 PLUS 1.1 % CREA dental cream Take by mouth at bedtime.     fenofibrate (TRICOR) 145 MG tablet Take 1 tablet by mouth once daily 90 tablet 1   Fluocinolone Acetonide 0.01 % OIL SMARTSIG:3 Drop(s) In Ear(s) Once a Week     levothyroxine (SYNTHROID) 150 MCG tablet TAKE 1 TABLET BY MOUTH EVERY OTHER DAY ALTERNATING WITH 1/2 TABLET EVERY OTHER DAY , 30 MINUTES BEFORE BREAKFAST. 68 tablet 5   Plecanatide (TRULANCE) 3 MG TABS Take 3 mg by mouth daily. For constipation 30 tablet 11   senna-docusate (SENOKOT-S) 8.6-50 MG tablet Take 2 tablets by mouth 2 (two) times daily. 100 tablet 6   terazosin (HYTRIN) 5 MG capsule Take 1 capsule (5 mg total) by mouth at bedtime. 90 capsule 3   Turmeric Curcumin 500 MG CAPS Take by mouth 1 day or 1 dose.     zinc gluconate 50 MG tablet Take 50 mg by mouth daily.     Biotin 5000 MCG TABS Take 1 tablet by mouth daily. (Patient not taking: Reported on 04/26/2022)     sildenafil (VIAGRA) 100 MG tablet Take 1 tablet (100 mg total) by mouth as needed for erectile dysfunction. 30 tablet 5   No facility-administered medications prior to visit.    ROS: Review of Systems  Constitutional:  Negative for appetite change, fatigue and unexpected weight  change.  HENT:  Negative for congestion, nosebleeds, sneezing, sore throat and trouble swallowing.   Eyes:  Negative for itching and visual disturbance.  Respiratory:  Negative for cough.   Cardiovascular:  Negative for chest pain, palpitations and leg swelling.  Gastrointestinal:  Negative for abdominal distention, blood in stool, diarrhea and nausea.  Genitourinary:  Negative for frequency and hematuria.  Musculoskeletal:  Negative for back pain, gait problem, joint swelling and neck pain.  Skin:  Negative for rash.  Neurological:  Negative for dizziness, tremors, speech difficulty and weakness.  Psychiatric/Behavioral:  Positive for decreased concentration. Negative for agitation, dysphoric mood, sleep disturbance and suicidal ideas. The patient is not nervous/anxious.     Objective:  BP 122/72 (BP Location: Left Arm, Patient Position: Sitting, Cuff Size: Normal)   Pulse 67   Temp 97.6 F (36.4 C) (Oral)   Ht '5\' 9"'$  (1.753 m)   Wt 210 lb (95.3 kg)   SpO2 97%   BMI 31.01 kg/m   BP Readings from Last 3 Encounters:  04/26/22 122/72  03/29/22 132/72  07/22/21 130/78    Wt Readings from Last 3 Encounters:  04/26/22 210 lb (95.3 kg)  03/29/22 201 lb (91.2 kg)  07/22/21 203 lb (92.1 kg)    Physical Exam  Constitutional:      Appearance: Normal appearance.  Musculoskeletal:        General: Tenderness present.     Lab Results  Component Value Date   WBC 6.2 03/10/2022   HGB 14.0 03/10/2022   HCT 43 03/10/2022   PLT 231 03/10/2022   GLUCOSE 96 03/29/2022   CHOL 225 (H) 03/29/2022   TRIG 107.0 03/29/2022   HDL 48.00 03/29/2022   LDLDIRECT 117.0 09/04/2015   LDLCALC 155 (H) 03/29/2022   ALT 14 03/29/2022   AST 20 03/29/2022   NA 141 03/29/2022   K 4.5 03/29/2022   CL 106 03/29/2022   CREATININE 1.18 03/29/2022   BUN 15 03/29/2022   CO2 29 03/29/2022   TSH 30.66 (H) 03/29/2022   PSA 3.68 03/29/2022   INR 1.00 10/04/2011   HGBA1C 5.7 07/03/2018    DG Chest 2  View  Result Date: 03/21/2022 CLINICAL DATA:  Chronic renal disease. EXAM: CHEST - 2 VIEW COMPARISON:  Chest radiograph 02/19/2013 FINDINGS: The heart size and mediastinal contours are within normal limits. Both lungs are clear. The visualized skeletal structures are unremarkable. IMPRESSION: No active cardiopulmonary disease. Electronically Signed   By: Lovey Newcomer M.D.   On: 03/21/2022 19:35    Assessment & Plan:   Problem List Items Addressed This Visit       Cardiovascular and Mediastinum   Coronary atherosclerosis    Follow-up with cardiology        Endocrine   Hypothyroidism     Nervous and Auditory   Mild cognitive disorder - Primary    We discussed genetic testing - I'll need to research what tests we can order.  He will ask his relative about the genetic testing she had in detail and let me know. Clinically - no signs of dementia         No orders of the defined types were placed in this encounter.     Follow-up: Return for a follow-up visit.  Walker Kehr, MD

## 2022-05-08 ENCOUNTER — Encounter: Payer: Self-pay | Admitting: Internal Medicine

## 2022-05-31 DIAGNOSIS — L821 Other seborrheic keratosis: Secondary | ICD-10-CM | POA: Diagnosis not present

## 2022-05-31 DIAGNOSIS — Z85828 Personal history of other malignant neoplasm of skin: Secondary | ICD-10-CM | POA: Diagnosis not present

## 2022-05-31 DIAGNOSIS — L570 Actinic keratosis: Secondary | ICD-10-CM | POA: Diagnosis not present

## 2022-05-31 DIAGNOSIS — Z85820 Personal history of malignant melanoma of skin: Secondary | ICD-10-CM | POA: Diagnosis not present

## 2022-05-31 DIAGNOSIS — L814 Other melanin hyperpigmentation: Secondary | ICD-10-CM | POA: Diagnosis not present

## 2022-05-31 DIAGNOSIS — I781 Nevus, non-neoplastic: Secondary | ICD-10-CM | POA: Diagnosis not present

## 2022-07-05 ENCOUNTER — Telehealth: Payer: Self-pay

## 2022-07-05 NOTE — Telephone Encounter (Signed)
Called patient to schedule Medicare Annual Wellness Visit (AWV). Left message for patient to call back and schedule Medicare Annual Wellness Visit (AWV).  Last date of AWV: 01/10/20  Please schedule an appointment at any time with [facility].   Norton Blizzard, [place] ([facility])  [place] [facility] [phone]

## 2022-07-18 ENCOUNTER — Encounter: Payer: Self-pay | Admitting: Internal Medicine

## 2022-07-18 NOTE — Assessment & Plan Note (Addendum)
Follow up with cardiology

## 2022-08-31 DIAGNOSIS — L570 Actinic keratosis: Secondary | ICD-10-CM | POA: Diagnosis not present

## 2022-08-31 DIAGNOSIS — Z85828 Personal history of other malignant neoplasm of skin: Secondary | ICD-10-CM | POA: Diagnosis not present

## 2022-08-31 DIAGNOSIS — Z85820 Personal history of malignant melanoma of skin: Secondary | ICD-10-CM | POA: Diagnosis not present

## 2022-08-31 DIAGNOSIS — L821 Other seborrheic keratosis: Secondary | ICD-10-CM | POA: Diagnosis not present

## 2022-08-31 DIAGNOSIS — L814 Other melanin hyperpigmentation: Secondary | ICD-10-CM | POA: Diagnosis not present

## 2022-08-31 DIAGNOSIS — I781 Nevus, non-neoplastic: Secondary | ICD-10-CM | POA: Diagnosis not present

## 2022-09-01 ENCOUNTER — Telehealth: Payer: Self-pay | Admitting: Internal Medicine

## 2022-09-01 NOTE — Telephone Encounter (Signed)
Contacted Donald Phillips. to schedule their annual wellness visit. Appointment made for 09/07/2022.  [facility] Care Guide [facility] AWV TEAM Direct Dial: [phone]

## 2022-09-02 ENCOUNTER — Ambulatory Visit (INDEPENDENT_AMBULATORY_CARE_PROVIDER_SITE_OTHER): Payer: Medicare Other | Admitting: Internal Medicine

## 2022-09-02 ENCOUNTER — Encounter: Payer: Self-pay | Admitting: Internal Medicine

## 2022-09-02 VITALS — BP 122/70 | HR 65 | Temp 98.1°F | Ht 69.0 in | Wt 202.0 lb

## 2022-09-02 DIAGNOSIS — I2583 Coronary atherosclerosis due to lipid rich plaque: Secondary | ICD-10-CM

## 2022-09-02 DIAGNOSIS — I25110 Atherosclerotic heart disease of native coronary artery with unstable angina pectoris: Secondary | ICD-10-CM | POA: Diagnosis not present

## 2022-09-02 DIAGNOSIS — I2510 Atherosclerotic heart disease of native coronary artery without angina pectoris: Secondary | ICD-10-CM

## 2022-09-02 DIAGNOSIS — I200 Unstable angina: Secondary | ICD-10-CM | POA: Insufficient documentation

## 2022-09-02 DIAGNOSIS — R079 Chest pain, unspecified: Secondary | ICD-10-CM

## 2022-09-02 DIAGNOSIS — Z789 Other specified health status: Secondary | ICD-10-CM

## 2022-09-02 MED ORDER — NITROGLYCERIN 0.4 MG SL SUBL: 0.4000 mg | SUBLINGUAL_TABLET | SUBLINGUAL | 3 refills | Status: AC | PRN

## 2022-09-02 NOTE — Assessment & Plan Note (Addendum)
x6 months Re-start Livalo 1/2 tab qod if tolerated NTG SL prn; not to take if used Viagra in 24 h Cont w/ASA Will ref to see Dr Beverely Pace

## 2022-09-02 NOTE — Assessment & Plan Note (Addendum)
Coronary calcium CT score of 276 - 2020 Will sch w/Dr Beverely Pace Re-start Livalo 1/2 tab qod if tolerated NTG SL prn

## 2022-09-02 NOTE — Assessment & Plan Note (Signed)
  Re-start Livalo 1/2 tab qod if tolerated Will ref to see Dr Beverely Pace

## 2022-09-02 NOTE — Addendum Note (Signed)
Addended by: Aundra Millet on: 09/02/2022 04:28 PM   Modules accepted: Orders

## 2022-09-02 NOTE — Progress Notes (Signed)
Subjective:  Patient ID: Donald Phillips., male    DOB: [birth year]  Age: 77 y.o. MRN: [id number]  CC: No chief complaint on file.   HPI Donald Phillips. presents for a sweating episode in bed 2 months ago. No LOC, CP, SOB, n/v  C/o burning sensation with shagging at times x 6 months, better w/rest, worse w/exertion  Outpatient Medications Prior to Visit  Medication Sig Dispense Refill   ascorbic acid (VITAMIN C) 500 MG tablet Take 500 mg by mouth 2 (two) times daily.     aspirin EC 81 MG tablet Take 81 mg by mouth daily.     buPROPion (WELLBUTRIN XL) 300 MG 24 hr tablet Take 1 tablet by mouth once daily 90 tablet 3   Cholecalciferol (VITAMIN D3) 1000 UNITS tablet Take 1,000 Units by mouth daily.     Cyanocobalamin (VITAMIN B-12) 1000 MCG SUBL Place 1 tablet (1,000 mcg total) under the tongue daily. 100 tablet 3   DENTA 5000 PLUS 1.1 % CREA dental cream Take by mouth at bedtime.     Docusate Sodium (DSS) 100 MG CAPS Take by mouth as needed.     fenofibrate (TRICOR) 145 MG tablet Take 1 tablet by mouth once daily 90 tablet 1   Fluocinolone Acetonide 0.01 % OIL SMARTSIG:3 Drop(s) In Ear(s) Once a Week     levothyroxine (SYNTHROID) 150 MCG tablet TAKE 1 TABLET BY MOUTH EVERY OTHER DAY ALTERNATING WITH 1/2 TABLET EVERY OTHER DAY , 30 MINUTES BEFORE BREAKFAST. 68 tablet 5   Plecanatide (TRULANCE) 3 MG TABS Take 3 mg by mouth daily. For constipation 30 tablet 11   terazosin (HYTRIN) 5 MG capsule Take 1 capsule (5 mg total) by mouth at bedtime. 90 capsule 3   Turmeric Curcumin 500 MG CAPS Take by mouth 1 day or 1 dose.     zinc gluconate 50 MG tablet Take 50 mg by mouth daily.     Biotin 5000 MCG TABS Take 1 tablet by mouth daily. (Patient not taking: Reported on 04/26/2022)     sildenafil (VIAGRA) 100 MG tablet Take 1 tablet (100 mg total) by mouth as needed for erectile dysfunction. 30 tablet 5   No facility-administered medications prior to visit.    ROS: Review of Systems   Constitutional:  Positive for diaphoresis. Negative for appetite change, fatigue and unexpected weight change.  HENT:  Negative for congestion, nosebleeds, sneezing, sore throat and trouble swallowing.   Eyes:  Negative for itching and visual disturbance.  Respiratory:  Positive for chest tightness. Negative for cough and shortness of breath.   Cardiovascular:  Negative for chest pain, palpitations and leg swelling.  Gastrointestinal:  Negative for abdominal distention, blood in stool, diarrhea and nausea.  Genitourinary:  Negative for frequency and hematuria.  Musculoskeletal:  Negative for back pain, gait problem, joint swelling and neck pain.  Skin:  Negative for rash.  Neurological:  Negative for dizziness, tremors, speech difficulty and weakness.  Psychiatric/Behavioral:  Negative for agitation, dysphoric mood and sleep disturbance. The patient is not nervous/anxious.     Objective:  BP 122/70 (BP Location: Left Arm, Patient Position: Sitting, Cuff Size: Normal)   Pulse 65   Temp 98.1 F (36.7 C) (Oral)   Ht  (1.753 m)   Wt 202 lb (91.6 kg)   SpO2 92%   BMI 29.83 kg/m   BP Readings from Last 3 Encounters:  09/02/22 122/70  04/26/22 122/72  03/29/22 132/72    Wt Readings from  Last 3 Encounters:  09/02/22 202 lb (91.6 kg)  04/26/22 210 lb (95.3 kg)  03/29/22 201 lb (91.2 kg)    Physical Exam Constitutional:      General: He is not in acute distress.    Appearance: He is well-developed.     Comments: NAD  Eyes:     Conjunctiva/sclera: Conjunctivae normal.     Pupils: Pupils are equal, round, and reactive to light.  Neck:     Thyroid: No thyromegaly.     Vascular: No JVD.  Cardiovascular:     Rate and Rhythm: Normal rate and regular rhythm.     Heart sounds: Normal heart sounds. No murmur heard.    No friction rub. No gallop.  Pulmonary:     Effort: Pulmonary effort is normal. No respiratory distress.     Breath sounds: Normal breath sounds. No wheezing or  rales.  Chest:     Chest wall: No tenderness.  Abdominal:     General: Bowel sounds are normal. There is no distension.     Palpations: Abdomen is soft. There is no mass.     Tenderness: There is no abdominal tenderness. There is no guarding or rebound.  Musculoskeletal:        General: No tenderness. Normal range of motion.     Cervical back: Normal range of motion.  Lymphadenopathy:     Cervical: No cervical adenopathy.  Skin:    General: Skin is warm and dry.     Findings: No rash.  Neurological:     Mental Status: He is alert and oriented to person, place, and time.     Cranial Nerves: No cranial nerve deficit.     Motor: No abnormal muscle tone.     Coordination: Coordination normal.     Gait: Gait normal.     Deep Tendon Reflexes: Reflexes are normal and symmetric.  Psychiatric:        Behavior: Behavior normal.        Thought Content: Thought content normal.        Judgment: Judgment normal.     Lab Results  Component Value Date   WBC 6.2 03/10/2022   HGB 14.0 03/10/2022   HCT 43 03/10/2022   PLT 231 03/10/2022   GLUCOSE 96 03/29/2022   CHOL 225 (H) 03/29/2022   TRIG 107.0 03/29/2022   HDL 48.00 03/29/2022   LDLDIRECT 117.0 09/04/2015   LDLCALC 155 (H) 03/29/2022   ALT 14 03/29/2022   AST 20 03/29/2022   NA 141 03/29/2022   K 4.5 03/29/2022   CL 106 03/29/2022   CREATININE 1.18 03/29/2022   BUN 15 03/29/2022   CO2 29 03/29/2022   TSH 30.66 (H) 03/29/2022   PSA 3.68 03/29/2022   INR 1.00 10/04/2011   HGBA1C 5.7 07/03/2018    DG Chest 2 View  Result Date: 03/21/2022 CLINICAL DATA:  Chronic renal disease. EXAM: CHEST - 2 VIEW COMPARISON:  Chest radiograph 02/19/2013 FINDINGS: The heart size and mediastinal contours are within normal limits. Both lungs are clear. The visualized skeletal structures are unremarkable. IMPRESSION: No active cardiopulmonary disease. Electronically Signed   By: Annia Belt M.D.   On: 03/21/2022 19:35    Assessment & Plan:    Problem List Items Addressed This Visit     Coronary atherosclerosis - Primary    Coronary calcium CT score of 276 - 2020 Will sch w/Dr Cheek Re-start Livalo 1/2 tab qod if tolerated NTG SL prn      Relevant  Medications   aspirin EC 81 MG tablet   nitroGLYCERIN (NITROSTAT) 0.4 MG SL tablet   Other Relevant Orders   Ambulatory referral to Cardiology   Statin intolerance     Re-start Livalo 1/2 tab qod if tolerated Will ref to see Dr Beverely Pace      Relevant Orders   Ambulatory referral to Cardiology   Angina pectoris, unstable    x6 months Re-start Livalo 1/2 tab qod if tolerated NTG SL prn; not to take if used Viagra in 24 h Cont w/ASA Will ref to see Dr Beverely Pace      Relevant Medications   aspirin EC 81 MG tablet   nitroGLYCERIN (NITROSTAT) 0.4 MG SL tablet   Other Relevant Orders   Ambulatory referral to Cardiology      Meds ordered this encounter  Medications   nitroGLYCERIN (NITROSTAT) 0.4 MG SL tablet    Sig: Place 1 tablet (0.4 mg total) under the tongue every 5 (five) minutes as needed for chest pain.    Dispense:  20 tablet    Refill:  3      Follow-up: Return in about 3 months (around 12/02/2022) for a follow-up visit.  Sonda Primes, MD

## 2022-09-07 ENCOUNTER — Ambulatory Visit (INDEPENDENT_AMBULATORY_CARE_PROVIDER_SITE_OTHER): Payer: Medicare Other

## 2022-09-07 VITALS — Ht 69.0 in | Wt 202.0 lb

## 2022-09-07 DIAGNOSIS — Z0000 Encounter for general adult medical examination without abnormal findings: Secondary | ICD-10-CM

## 2022-09-07 NOTE — Progress Notes (Addendum)
I connected with  Donald Phillips. on 09/07/22 by a audio enabled telemedicine application and verified that I am speaking with the correct person using two identifiers.  Patient Location: Home  Provider Location: Office/Clinic  I discussed the limitations of evaluation and management by telemedicine. The patient expressed understanding and agreed to proceed.  Patient Medicare AWV questionnaire was completed by the patient on 09/03/2022; I have confirmed that all information answered by patient is correct and no changes since this date.     Subjective:   Donald Phillips. is a 77 y.o. male who presents for Medicare Annual/Subsequent preventive examination.  Review of Systems     Cardiac Risk Factors include: advanced age (>64men, >44 women);dyslipidemia;family history of premature cardiovascular disease;hypertension;male gender;sedentary lifestyle     Objective:    Today's Vitals   09/07/22 0922 09/07/22 0923  Weight: 202 lb (91.6 kg)   Height: 5\' 9"  (1.753 m)   PainSc: 0-No pain 0-No pain   Body mass index is 29.83 kg/m.     09/07/2022    9:25 AM 01/10/2020   11:22 AM 10/05/2011   11:51 AM  Advanced Directives  Does Patient Have a Medical Advance Directive? Yes Yes Patient does not have advance directive;Patient has advance directive, copy not in chart  Type of Advance Directive Healthcare Power of [place];Living will Healthcare Power of [place];Living will Living will  Copy of Healthcare Power of Attorney in Chart? No - copy requested No - copy requested Copy requested from other (Comment)    Current Medications (verified) Outpatient Encounter Medications as of 09/07/2022  Medication Sig   ascorbic acid (VITAMIN C) 500 MG tablet Take 500 mg by mouth 2 (two) times daily.   aspirin EC 81 MG tablet Take 81 mg by mouth daily.   Biotin 5000 MCG TABS Take 1 tablet by mouth daily. (Patient not taking: Reported on 04/26/2022)   buPROPion (WELLBUTRIN XL) 300 MG 24 hr tablet  Take 1 tablet by mouth once daily   Cholecalciferol (VITAMIN D3) 1000 UNITS tablet Take 1,000 Units by mouth daily.   Cyanocobalamin (VITAMIN B-12) 1000 MCG SUBL Place 1 tablet (1,000 mcg total) under the tongue daily.   DENTA 5000 PLUS 1.1 % CREA dental cream Take by mouth at bedtime.   Docusate Sodium (DSS) 100 MG CAPS Take by mouth as needed.   fenofibrate (TRICOR) 145 MG tablet Take 1 tablet by mouth once daily (Patient taking differently: 145 mg as needed. Half tablet every day)   Fluocinolone Acetonide 0.01 % OIL SMARTSIG:3 Drop(s) In Ear(s) Once a Week   levothyroxine (SYNTHROID) 150 MCG tablet TAKE 1 TABLET BY MOUTH EVERY OTHER DAY ALTERNATING WITH 1/2 TABLET EVERY OTHER DAY , 30 MINUTES BEFORE BREAKFAST.   nitroGLYCERIN (NITROSTAT) 0.4 MG SL tablet Place 1 tablet (0.4 mg total) under the tongue every 5 (five) minutes as needed for chest pain.   Plecanatide (TRULANCE) 3 MG TABS Take 3 mg by mouth daily. For constipation   sildenafil (VIAGRA) 100 MG tablet Take 1 tablet (100 mg total) by mouth as needed for erectile dysfunction.   terazosin (HYTRIN) 5 MG capsule Take 1 capsule (5 mg total) by mouth at bedtime.   Turmeric Curcumin 500 MG CAPS Take by mouth 1 day or 1 dose.   zinc gluconate 50 MG tablet Take 50 mg by mouth daily.   No facility-administered encounter medications on file as of 09/07/2022.    Allergies (verified) 2,4-d dimethylamine   History: Past Medical History:  Diagnosis Date   Allergic rhinitis    Asthma    remote hx   Chronic kidney disease    stones   Diverticulosis of colon    Hemorrhoid    Hyperlipidemia    Hypertension    Impotence of organic origin    Other testicular hypofunction    Unspecified hypothyroidism    Past Surgical History:  Procedure Laterality Date   distal radial fracture  10/04/2011   HEMORRHOID SURGERY     INGUINAL HERNIA REPAIR     vastectomy     Family History  Problem Relation Age of Onset   Dementia Mother    Cancer  Father        prostate   Heart disease Father        heart attack   Stroke Father    Cancer Sister    Cancer Other        prostate   Social History   Socioeconomic History   Marital status: Married    Spouse name: Not on file   Number of children: Not on file   Years of education: Not on file   Highest education level: Not on file  Occupational History   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  Tobacco Use   Smoking status: Former   Smokeless tobacco: Former  Substance and Sexual Activity   Alcohol use: No   Drug use: No   Sexual activity: Yes  Other Topics Concern   Not on file  Social History Narrative   6 grandchildren.   Social Determinants of Health   Financial Resource Strain: Low Risk  (09/07/2022)   Overall Financial Resource Strain (CARDIA)    Difficulty of Paying Living Expenses: Not hard at all  Food Insecurity: No Food Insecurity (09/07/2022)   Hunger Vital Sign    Worried About Running Out of Food in the Last Year: Never true    Ran Out of Food in the Last Year: Never true  Transportation Needs: No Transportation Needs (09/07/2022)   PRAPARE - Administrator, Civil Service (Medical): No    Lack of Transportation (Non-Medical): No  Physical Activity: Inactive (09/07/2022)   Exercise Vital Sign    Days of Exercise per Week: 0 days    Minutes of Exercise per Session: 0 min  Stress: No Stress Concern Present (09/07/2022)   [employer] of Occupational Health - Occupational Stress Questionnaire    Feeling of Stress : Only a little  Social Connections: Unknown (09/07/2022)   Social Connection and Isolation Panel [NHANES]    Frequency of Communication with Friends and Family: More than three times a week    Frequency of Social Gatherings with Friends and Family: Not on file    Attends Religious Services: Not on Marketing executive or Organizations: Yes    Attends Engineer, structural: More  than 4 times per year    Marital Status: Married    Tobacco Counseling Counseling given: Not Answered   Clinical Intake:  Pre-visit preparation completed: Yes  Pain : No/denies pain Pain Score: 0-No pain     BMI - recorded: 29.83 Nutritional Risks: None Diabetes: No  How often do you need to have someone help you when you read instructions, pamphlets, or other written materials from your doctor or pharmacy?: 1 - Never What is the last grade level you completed in school?: HSG  Diabetic? No  Interpreter Needed?: No  Information entered by :: Susie Cassette, LPN.   Activities of Daily Living    09/07/2022    9:29 AM 09/03/2022   12:39 PM  In your present state of health, do you have any difficulty performing the following activities:  Hearing? 0 0  Vision? 0 0  Difficulty concentrating or making decisions? 0 0  Walking or climbing stairs? 0 0  Dressing or bathing? 0 0  Doing errands, shopping? 0 0  Preparing Food and eating ? N N  Using the Toilet? N N  In the past six months, have you accidently leaked urine? Y Y  Do you have problems with loss of bowel control? N N  Managing your Medications? N N  Managing your Finances? N N  Housekeeping or managing your Housekeeping? N N    Patient Care Team: Plotnikov, Georgina Quint, MD as PCP - General Leone Payor Maryjean Morn, MD as Consulting Physician (Gastroenterology) Bufford Buttner, MD as Consulting Physician (Nephrology)  Indicate any recent Medical Services you may have received from other than [facility] providers in the past year (date may be approximate).     Assessment:   This is a routine wellness examination for Dorr.  Hearing/Vision screen Hearing Screening - Comments:: Patient wears hearing aids. Vision Screening - Comments:: Wears rx glasses - in the process of finding a new optometrist.   Dietary issues and exercise activities discussed: Current Exercise Habits: The patient does not participate in regular  exercise at present, Exercise limited by: None identified   Goals Addressed             This Visit's Progress    My goal for 2024 is to get back to walking and being more physically active.        Depression Screen    09/07/2022    9:39 AM 09/07/2022    9:31 AM 09/02/2022    1:58 PM 04/26/2022    9:27 AM 03/29/2022    9:05 AM 01/22/2021    8:05 AM 07/16/2020    8:13 AM  PHQ 2/9 Scores  PHQ - 2 Score 0 0 0 0 0 0 0  PHQ- 9 Score 0     0 0    Fall Risk    09/07/2022    9:26 AM 09/03/2022   12:39 PM 09/02/2022    1:58 PM 04/26/2022    9:27 AM 03/29/2022    9:05 AM  Fall Risk   Falls in the past year? 0 0 0 0 0  Number falls in past yr: 0 0 0 0 0  Injury with Fall? 0 0 0 0 0  Risk for fall due to : No Fall Risks  No Fall Risks No Fall Risks No Fall Risks  Follow up Falls prevention discussed  Falls evaluation completed Falls evaluation completed Falls evaluation completed    FALL RISK PREVENTION PERTAINING TO THE HOME:  Any stairs in or around the home? No  If so, are there any without handrails? No  Home free of loose throw rugs in walkways, pet beds, electrical cords, etc? Yes  Adequate lighting in your home to reduce risk of falls? Yes   ASSISTIVE DEVICES UTILIZED TO PREVENT FALLS:  Life alert? No  Use of a cane, walker or w/c? No  Grab bars in the bathroom? No  Shower chair or bench in shower? No  Elevated toilet seat or a handicapped toilet? No   TIMED UP AND GO:  Was the test performed? No . Telephonic Visit  Cognitive Function:        09/07/2022    9:28 AM  6CIT Screen  What Year? 0 points  What month? 0 points  What time? 0 points  Count back from 20 0 points  Months in reverse 0 points  Repeat phrase 0 points  Total Score 0 points    Immunizations Immunization History  Administered Date(s) Administered   Fluad Quad(high Dose 65+) 01/03/2019, 01/22/2021, 02/18/2022   Influenza, High Dose Seasonal PF 01/16/2016, 05/24/2017, 02/27/2018    Influenza,inj,Quad PF,6+ Mos 02/19/2013, 04/19/2014   PFIZER Comirnaty(Gray Top)Covid-19 Tri-Sucrose Vaccine 08/26/2020   PFIZER(Purple Top)SARS-COV-2 Vaccination 07/12/2019, 08/02/2019   Pneumococcal Conjugate-13 12/21/2013   Pneumococcal Polysaccharide-23 04/19/2014   Td 12/23/2014   Zoster Recombinat (Shingrix) 11/27/2016, 05/31/2017    TDAP status: Up to date  Flu Vaccine status: Up to date  Pneumococcal vaccine status: Up to date  Covid-19 vaccine status: Completed vaccines  Qualifies for Shingles Vaccine? Yes   Zostavax completed No   Shingrix Completed?: Yes  Screening Tests Health Maintenance  Topic Date Due   Hepatitis C Screening  Never done   COVID-19 Vaccine (4 - 2023-24 season) 09/18/2022 (Originally 01/08/2022)   INFLUENZA VACCINE  12/09/2022   Medicare Annual Wellness (AWV)  09/07/2023   Fecal DNA (Cologuard)  02/05/2024   DTaP/Tdap/Td (2 - Tdap) 12/22/2024   Pneumonia Vaccine 24+ Years old  Completed   Zoster Vaccines- Shingrix  Completed   HPV VACCINES  Aged Out    Health Maintenance  Health Maintenance Due  Topic Date Due   Hepatitis C Screening  Never done    Colorectal cancer screening: Type of screening: Cologuard. Completed 02/10/2021. Repeat every 3 years  Lung Cancer Screening: (Low Dose CT Chest recommended if Age 55-80 years, 30 pack-year currently smoking OR have quit w/in 15years.) does not qualify.   Lung Cancer Screening Referral: No  Additional Screening:  Hepatitis C Screening: does not qualify; Completed: No  Vision Screening: Recommended annual ophthalmology exams for early detection of glaucoma and other disorders of the eye. Is the patient up to date with their annual eye exam?  Yes  Who is the provider or what is the name of the office in which the patient attends annual eye exams?  [place], MD. If pt is not established with a provider, would they like to be referred to a provider to establish care? No .   Dental  Screening: Recommended annual dental exams for proper oral hygiene  Community Resource Referral / Chronic Care Management: CRR required this visit?  No   CCM required this visit?  No      Plan:     I have personally reviewed and noted the following in the patient's chart:   Medical and social history Use of alcohol, tobacco or illicit drugs  Current medications and supplements including opioid prescriptions. Patient is not currently taking opioid prescriptions. Functional ability and status Nutritional status Physical activity Advanced directives List of other physicians Hospitalizations, surgeries, and ER visits in previous 12 months Vitals Screenings to include cognitive, depression, and falls Referrals and appointments  In addition, I have reviewed and discussed with patient certain preventive protocols, quality metrics, and best practice recommendations. A written personalized care plan for preventive services as well as general preventive health recommendations were provided to patient.     Mickeal Needy, LPN   01/22/7828   Nurse Notes:  Normal cognitive status assessed by direct observation by this Nurse Health Advisor. No abnormalities found.  Medical screening examination/treatment/procedure(s) were performed by non-physician practitioner and as supervising physician I was immediately available for consultation/collaboration.  I agree with above. Lew Dawes, MD

## 2022-09-07 NOTE — Patient Instructions (Signed)
Donald Phillips , Thank you for taking time to come for your Medicare Wellness Visit. I appreciate your ongoing commitment to your health goals. Please review the following plan we discussed and let me know if I can assist you in the future.   These are the goals we discussed:  Goals      My goal for 2024 is to get back to walking and being more physically active.        This is a list of the screening recommended for you and due dates:  Health Maintenance  Topic Date Due   Hepatitis C Screening: USPSTF Recommendation to screen - Ages 9-79 yo.  Never done   COVID-19 Vaccine (4 - 2023-24 season) 09/18/2022*   Flu Shot  12/09/2022   Medicare Annual Wellness Visit  09/07/2023   Cologuard (Stool DNA test)  02/05/2024   DTaP/Tdap/Td vaccine (2 - Tdap) 12/22/2024   Pneumonia Vaccine  Completed   Zoster (Shingles) Vaccine  Completed   HPV Vaccine  Aged Out  *Topic was postponed. The date shown is not the original due date.    Advanced directives: Yes; Please bring a copy of your health care power of attorney and living will to the office at your convenience.  Conditions/risks identified: Yes  Next appointment: Follow up in one year for your annual wellness visit.   Preventive Care 87 Years and Older, Male  Preventive care refers to lifestyle choices and visits with your health care provider that can promote health and wellness. What does preventive care include? A yearly physical exam. This is also called an annual well check. Dental exams once or twice a year. Routine eye exams. Ask your health care provider how often you should have your eyes checked. Personal lifestyle choices, including: Daily care of your teeth and gums. Regular physical activity. Eating a healthy diet. Avoiding tobacco and drug use. Limiting alcohol use. Practicing safe sex. Taking low doses of aspirin every day. Taking vitamin and mineral supplements as recommended by your health care provider. What happens  during an annual well check? The services and screenings done by your health care provider during your annual well check will depend on your age, overall health, lifestyle risk factors, and family history of disease. Counseling  Your health care provider may ask you questions about your: Alcohol use. Tobacco use. Drug use. Emotional well-being. Home and relationship well-being. Sexual activity. Eating habits. History of falls. Memory and ability to understand (cognition). Work and work Astronomer. Screening  You may have the following tests or measurements: Height, weight, and BMI. Blood pressure. Lipid and cholesterol levels. These may be checked every 5 years, or more frequently if you are over 43 years old. Skin check. Lung cancer screening. You may have this screening every year starting at age 37 if you have a 30-pack-year history of smoking and currently smoke or have quit within the past 15 years. Fecal occult blood test (FOBT) of the stool. You may have this test every year starting at age 35. Flexible sigmoidoscopy or colonoscopy. You may have a sigmoidoscopy every 5 years or a colonoscopy every 10 years starting at age 77. Prostate cancer screening. Recommendations will vary depending on your family history and other risks. Hepatitis C blood test. Hepatitis B blood test. Sexually transmitted disease (STD) testing. Diabetes screening. This is done by checking your blood sugar (glucose) after you have not eaten for a while (fasting). You may have this done every 1-3 years. Abdominal aortic aneurysm (AAA) screening.  You may need this if you are a current or former smoker. Osteoporosis. You may be screened starting at age 58 if you are at high risk. Talk with your health care provider about your test results, treatment options, and if necessary, the need for more tests. Vaccines  Your health care provider may recommend certain vaccines, such as: Influenza vaccine. This is  recommended every year. Tetanus, diphtheria, and acellular pertussis (Tdap, Td) vaccine. You may need a Td booster every 10 years. Zoster vaccine. You may need this after age 36. Pneumococcal 13-valent conjugate (PCV13) vaccine. One dose is recommended after age 69. Pneumococcal polysaccharide (PPSV23) vaccine. One dose is recommended after age 73. Talk to your health care provider about which screenings and vaccines you need and how often you need them. This information is not intended to replace advice given to you by your health care provider. Make sure you discuss any questions you have with your health care provider. Document Released: 05/23/2015 Document Revised: 01/14/2016 Document Reviewed: 02/25/2015 Elsevier Interactive Patient Education  2017 [place] Prevention in the Home Falls can cause injuries. They can happen to people of all ages. There are many things you can do to make your home safe and to help prevent falls. What can I do on the outside of my home? Regularly fix the edges of walkways and driveways and fix any cracks. Remove anything that might make you trip as you walk through a door, such as a raised step or threshold. Trim any bushes or trees on the path to your home. Use bright outdoor lighting. Clear any walking paths of anything that might make someone trip, such as rocks or tools. Regularly check to see if handrails are loose or broken. Make sure that both sides of any steps have handrails. Any raised decks and porches should have guardrails on the edges. Have any leaves, snow, or ice cleared regularly. Use sand or salt on walking paths during winter. Clean up any spills in your garage right away. This includes oil or grease spills. What can I do in the bathroom? Use night lights. Install grab bars by the toilet and in the tub and shower. Do not use towel bars as grab bars. Use non-skid mats or decals in the tub or shower. If you need to sit down in  the shower, use a plastic, non-slip stool. Keep the floor dry. Clean up any water that spills on the floor as soon as it happens. Remove soap buildup in the tub or shower regularly. Attach bath mats securely with double-sided non-slip rug tape. Do not have throw rugs and other things on the floor that can make you trip. What can I do in the bedroom? Use night lights. Make sure that you have a light by your bed that is easy to reach. Do not use any sheets or blankets that are too big for your bed. They should not hang down onto the floor. Have a firm chair that has side arms. You can use this for support while you get dressed. Do not have throw rugs and other things on the floor that can make you trip. What can I do in the kitchen? Clean up any spills right away. Avoid walking on wet floors. Keep items that you use a lot in easy-to-reach places. If you need to reach something above you, use a strong step stool that has a grab bar. Keep electrical cords out of the way. Do not use floor polish or wax  that makes floors slippery. If you must use wax, use non-skid floor wax. Do not have throw rugs and other things on the floor that can make you trip. What can I do with my stairs? Do not leave any items on the stairs. Make sure that there are handrails on both sides of the stairs and use them. Fix handrails that are broken or loose. Make sure that handrails are as long as the stairways. Check any carpeting to make sure that it is firmly attached to the stairs. Fix any carpet that is loose or worn. Avoid having throw rugs at the top or bottom of the stairs. If you do have throw rugs, attach them to the floor with carpet tape. Make sure that you have a light switch at the top of the stairs and the bottom of the stairs. If you do not have them, ask someone to add them for you. What else can I do to help prevent falls? Wear shoes that: Do not have high heels. Have rubber bottoms. Are comfortable  and fit you well. Are closed at the toe. Do not wear sandals. If you use a stepladder: Make sure that it is fully opened. Do not climb a closed stepladder. Make sure that both sides of the stepladder are locked into place. Ask someone to hold it for you, if possible. Clearly mark and make sure that you can see: Any grab bars or handrails. First and last steps. Where the edge of each step is. Use tools that help you move around (mobility aids) if they are needed. These include: Canes. Walkers. Scooters. Crutches. Turn on the lights when you go into a dark area. Replace any light bulbs as soon as they burn out. Set up your furniture so you have a clear path. Avoid moving your furniture around. If any of your floors are uneven, fix them. If there are any pets around you, be aware of where they are. Review your medicines with your doctor. Some medicines can make you feel dizzy. This can increase your chance of falling. Ask your doctor what other things that you can do to help prevent falls. This information is not intended to replace advice given to you by your health care provider. Make sure you discuss any questions you have with your health care provider. Document Released: 02/20/2009 Document Revised: 10/02/2015 Document Reviewed: 05/31/2014 Elsevier Interactive Patient Education  2017 [employer].

## 2022-09-10 DIAGNOSIS — I10 Essential (primary) hypertension: Secondary | ICD-10-CM | POA: Diagnosis not present

## 2022-09-10 DIAGNOSIS — R931 Abnormal findings on diagnostic imaging of heart and coronary circulation: Secondary | ICD-10-CM | POA: Diagnosis not present

## 2022-09-10 DIAGNOSIS — I2510 Atherosclerotic heart disease of native coronary artery without angina pectoris: Secondary | ICD-10-CM | POA: Diagnosis not present

## 2022-09-10 DIAGNOSIS — E789 Disorder of lipoprotein metabolism, unspecified: Secondary | ICD-10-CM | POA: Diagnosis not present

## 2022-09-10 DIAGNOSIS — I440 Atrioventricular block, first degree: Secondary | ICD-10-CM | POA: Diagnosis not present

## 2022-09-10 DIAGNOSIS — I25118 Atherosclerotic heart disease of native coronary artery with other forms of angina pectoris: Secondary | ICD-10-CM | POA: Diagnosis not present

## 2022-09-10 DIAGNOSIS — I2583 Coronary atherosclerosis due to lipid rich plaque: Secondary | ICD-10-CM | POA: Diagnosis not present

## 2022-09-10 DIAGNOSIS — Z789 Other specified health status: Secondary | ICD-10-CM | POA: Diagnosis not present

## 2022-10-01 DIAGNOSIS — I2510 Atherosclerotic heart disease of native coronary artery without angina pectoris: Secondary | ICD-10-CM | POA: Diagnosis not present

## 2022-10-01 DIAGNOSIS — I2583 Coronary atherosclerosis due to lipid rich plaque: Secondary | ICD-10-CM | POA: Diagnosis not present

## 2022-11-29 DIAGNOSIS — L570 Actinic keratosis: Secondary | ICD-10-CM | POA: Diagnosis not present

## 2022-11-29 DIAGNOSIS — Z85820 Personal history of malignant melanoma of skin: Secondary | ICD-10-CM | POA: Diagnosis not present

## 2022-11-29 DIAGNOSIS — L821 Other seborrheic keratosis: Secondary | ICD-10-CM | POA: Diagnosis not present

## 2022-11-29 DIAGNOSIS — L814 Other melanin hyperpigmentation: Secondary | ICD-10-CM | POA: Diagnosis not present

## 2022-11-29 DIAGNOSIS — I781 Nevus, non-neoplastic: Secondary | ICD-10-CM | POA: Diagnosis not present

## 2022-11-29 DIAGNOSIS — Z85828 Personal history of other malignant neoplasm of skin: Secondary | ICD-10-CM | POA: Diagnosis not present

## 2022-12-07 ENCOUNTER — Other Ambulatory Visit: Payer: Self-pay | Admitting: Internal Medicine

## 2023-01-05 ENCOUNTER — Other Ambulatory Visit: Payer: Self-pay | Admitting: Internal Medicine

## 2023-01-28 DIAGNOSIS — H5213 Myopia, bilateral: Secondary | ICD-10-CM | POA: Diagnosis not present

## 2023-01-28 DIAGNOSIS — H2513 Age-related nuclear cataract, bilateral: Secondary | ICD-10-CM | POA: Diagnosis not present

## 2023-02-11 DIAGNOSIS — Z23 Encounter for immunization: Secondary | ICD-10-CM | POA: Diagnosis not present

## 2023-02-16 ENCOUNTER — Telehealth: Payer: Self-pay | Admitting: Internal Medicine

## 2023-02-16 NOTE — Telephone Encounter (Signed)
Wife called back and after talking with her she made an appointment for her husband.  She is very concerned about his mental health.  She fears it is dementia.  He is extremely forgetful and is experiencing severe anger issues.  Patient has told her that Dr. Posey Rea told him that it is normal to forget.  She said that dementia runs in his family and that his behavior is far from normal.  She states that the anger issues have become so bad that she is afraid to accompany him to the appointment because she is afraid of him.  She states that he is miserable.

## 2023-02-16 NOTE — Telephone Encounter (Signed)
Patient's wife called and said she would like to speak with Dr. Avel Sensor or his nurse. She said "there is a delicate matter regarding her husband that would be beneficial for them to know." She refused to give further information, but she said she didn't want her husband to know that she called. She would like a call back at [phone].

## 2023-02-18 NOTE — Telephone Encounter (Signed)
Noted! Thank you

## 2023-02-23 ENCOUNTER — Encounter: Payer: Self-pay | Admitting: Internal Medicine

## 2023-02-23 ENCOUNTER — Ambulatory Visit: Payer: Medicare Other | Admitting: Internal Medicine

## 2023-02-23 VITALS — BP 120/70 | HR 66 | Temp 98.3°F | Ht 69.0 in | Wt 190.0 lb

## 2023-02-23 DIAGNOSIS — F489 Nonpsychotic mental disorder, unspecified: Secondary | ICD-10-CM | POA: Diagnosis not present

## 2023-02-23 DIAGNOSIS — E034 Atrophy of thyroid (acquired): Secondary | ICD-10-CM

## 2023-02-23 DIAGNOSIS — R413 Other amnesia: Secondary | ICD-10-CM | POA: Diagnosis not present

## 2023-02-23 MED ORDER — ESCITALOPRAM OXALATE 5 MG PO TABS: 5.0000 mg | ORAL_TABLET | Freq: Every day | ORAL | 5 refills | Status: DC

## 2023-02-23 MED ORDER — ALPRAZOLAM 0.5 MG PO TABS: 0.5000 mg | ORAL_TABLET | Freq: Two times a day (BID) | ORAL | 2 refills | Status: AC | PRN

## 2023-02-23 NOTE — Assessment & Plan Note (Signed)
Chronic  On Levothroid. FT4 - WNL

## 2023-02-23 NOTE — Assessment & Plan Note (Addendum)
Discussed a "short fuse" issue w/disagreements. No h/o verbal or physical altercations. D/c Wellbutrin Start low dose Lexapro Xamax prn for rare use  Potential benefits of a long term benzodiazepines  use as well as potential risks  and complications were explained to the patient and were aknowledged. Marriage counseling can be pursued. A more comprehensive memory testing can be pursued too.

## 2023-02-23 NOTE — Progress Notes (Signed)
Subjective:  Patient ID: Donald Sayres., male    DOB: 1946/04/04  Age: 77 y.o. MRN: [id number]  CC: Advice Only (Pts wife is concerned about pts family of Dementia and would like to be checked to ensure he is not having any issues his self.)   HPI Donald Sayres. presents for memory loss, anger issues per Fannie Knee, Bill's wife. Fannie Knee made this appointment for Bill.  " Per wife's Fannie Knee) phone call - she is very concerned about his mental health.  She fears it is dementia.  He is extremely forgetful and is experiencing severe anger issues.  Patient has told her that Dr. Posey Rea told him that it is normal to forget.  She said that dementia runs in his family and that his behavior is far from normal.  She states that the anger issues have become so bad that she is afraid to accompany him to the appointment because she is afraid of him."  Outpatient Medications Prior to Visit  Medication Sig Dispense Refill   Alirocumab (PRALUENT) 150 MG/ML SOAJ Inject into the skin.     ascorbic acid (VITAMIN C) 500 MG tablet Take 500 mg by mouth 2 (two) times daily.     aspirin EC 81 MG tablet Take 81 mg by mouth daily.     Cholecalciferol (VITAMIN D3) 1000 UNITS tablet Take 1,000 Units by mouth daily.     Cyanocobalamin (VITAMIN B-12) 1000 MCG SUBL Place 1 tablet (1,000 mcg total) under the tongue daily. 100 tablet 3   DENTA 5000 PLUS 1.1 % CREA dental cream Take by mouth at bedtime.     Docusate Sodium (DSS) 100 MG CAPS Take by mouth as needed.     fenofibrate (TRICOR) 145 MG tablet Take 1 tablet by mouth once daily 90 tablet 0   Fluocinolone Acetonide 0.01 % OIL SMARTSIG:3 Drop(s) In Ear(s) Once a Week     levothyroxine (SYNTHROID) 150 MCG tablet TAKE 1 TABLET BY MOUTH EVERY OTHER DAY ALTERNATING WITH 1/2 (ONE-HALF) TABLET EVERY OTHER DAY 30 MINUTES BEFORE BREAKFAST 68 tablet 1   nitroGLYCERIN (NITROSTAT) 0.4 MG SL tablet Place 1 tablet (0.4 mg total) under the tongue every 5 (five) minutes as needed for  chest pain. 20 tablet 3   Plecanatide (TRULANCE) 3 MG TABS Take 3 mg by mouth daily. For constipation 30 tablet 11   terazosin (HYTRIN) 5 MG capsule Take 1 capsule (5 mg total) by mouth at bedtime. 90 capsule 3   Turmeric Curcumin 500 MG CAPS Take by mouth 1 day or 1 dose.     zinc gluconate 50 MG tablet Take 50 mg by mouth daily.     buPROPion (WELLBUTRIN XL) 300 MG 24 hr tablet Take 1 tablet by mouth once daily 90 tablet 3   Biotin 5000 MCG TABS Take 1 tablet by mouth daily. (Patient not taking: Reported on 04/26/2022)     sildenafil (VIAGRA) 100 MG tablet Take 1 tablet (100 mg total) by mouth as needed for erectile dysfunction. 30 tablet 5   No facility-administered medications prior to visit.    ROS: Review of Systems  Constitutional:  Negative for appetite change, fatigue and unexpected weight change.  HENT:  Negative for congestion, nosebleeds, sneezing, sore throat and trouble swallowing.   Eyes:  Negative for itching and visual disturbance.  Respiratory:  Negative for cough.   Cardiovascular:  Negative for chest pain, palpitations and leg swelling.  Gastrointestinal:  Negative for abdominal distention, blood in stool, diarrhea and  nausea.  Genitourinary:  Negative for frequency and hematuria.  Musculoskeletal:  Negative for back pain, gait problem, joint swelling and neck pain.  Skin:  Negative for rash.  Neurological:  Negative for dizziness, tremors, speech difficulty and weakness.  Psychiatric/Behavioral:  Positive for decreased concentration and dysphoric mood. Negative for agitation, behavioral problems, sleep disturbance and suicidal ideas. The patient is not nervous/anxious.     Objective:  BP 120/70 (BP Location: Right Arm, Patient Position: Sitting, Cuff Size: Normal)   Pulse 66   Temp 98.3 F (36.8 C) (Oral)   Ht 5\' 9"  (1.753 m)   Wt 190 lb (86.2 kg)   SpO2 95%   BMI 28.06 kg/m   BP Readings from Last 3 Encounters:  02/23/23 120/70  09/02/22 122/70  04/26/22  122/72    Wt Readings from Last 3 Encounters:  02/23/23 190 lb (86.2 kg)  09/07/22 202 lb (91.6 kg)  09/02/22 202 lb (91.6 kg)    Physical Exam Constitutional:      General: He is not in acute distress.    Appearance: He is well-developed.     Comments: NAD  Eyes:     Conjunctiva/sclera: Conjunctivae normal.     Pupils: Pupils are equal, round, and reactive to light.  Neck:     Thyroid: No thyromegaly.     Vascular: No JVD.  Cardiovascular:     Rate and Rhythm: Normal rate and regular rhythm.     Heart sounds: Normal heart sounds. No murmur heard.    No friction rub. No gallop.  Pulmonary:     Effort: Pulmonary effort is normal. No respiratory distress.     Breath sounds: Normal breath sounds. No wheezing or rales.  Chest:     Chest wall: No tenderness.  Abdominal:     General: Bowel sounds are normal. There is no distension.     Palpations: Abdomen is soft. There is no mass.     Tenderness: There is no abdominal tenderness. There is no guarding or rebound.  Musculoskeletal:        General: No tenderness. Normal range of motion.     Cervical back: Normal range of motion.  Lymphadenopathy:     Cervical: No cervical adenopathy.  Skin:    General: Skin is warm and dry.     Findings: No rash.  Neurological:     Mental Status: He is alert and oriented to person, place, and time.     Cranial Nerves: No cranial nerve deficit.     Motor: No weakness or abnormal muscle tone.     Coordination: Coordination normal.     Gait: Gait normal.     Deep Tendon Reflexes: Reflexes are normal and symmetric.  Psychiatric:        Behavior: Behavior normal.        Thought Content: Thought content normal.        Judgment: Judgment normal.   Not suicidal or homicidal. Very pleasant and appropriate   MMSE Face of a clock 8:15   Lab Results  Component Value Date   WBC 6.2 03/10/2022   HGB 14.0 03/10/2022   HCT 43 03/10/2022   PLT 231 03/10/2022   GLUCOSE 96 03/29/2022   CHOL  225 (H) 03/29/2022   TRIG 107.0 03/29/2022   HDL 48.00 03/29/2022   LDLDIRECT 117.0 09/04/2015   LDLCALC 155 (H) 03/29/2022   ALT 14 03/29/2022   AST 20 03/29/2022   NA 141 03/29/2022   K 4.5 03/29/2022  CL 106 03/29/2022   CREATININE 1.18 03/29/2022   BUN 15 03/29/2022   CO2 29 03/29/2022   TSH 30.66 (H) 03/29/2022   PSA 3.68 03/29/2022   INR 1.00 10/04/2011   HGBA1C 5.7 07/03/2018    DG Chest 2 View  Result Date: 03/21/2022 CLINICAL DATA:  Chronic renal disease. EXAM: CHEST - 2 VIEW COMPARISON:  Chest radiograph 02/19/2013 FINDINGS: The heart size and mediastinal contours are within normal limits. Both lungs are clear. The visualized skeletal structures are unremarkable. IMPRESSION: No active cardiopulmonary disease. Electronically Signed   By: Annia Belt M.D.   On: 03/21/2022 19:35     A total time of 45 minutes was spent preparing to see the patient, reviewing tests, x-rays, operative reports and other medical records.  Also, obtaining history and performing comprehensive physical exam.  Additionally, counseling the patient regarding the above listed issues, administering tests.   Finally, documenting clinical information in the health records, coordination of care, educating the patient.    Assessment & Plan:   Problem List Items Addressed This Visit     Hypothyroidism    Chronic  On Levothroid. FT4 - WNL      Memory problem - Primary    Mild cognitive deficit. No dementia. MMSE 30/30. Face of a clock drawing - perfect      Mood problem    Discussed a "short fuse" issue w/disagreements. No h/o verbal or physical altercations. D/c Wellbutrin Start low dose Lexapro Xamax prn for rare use  Potential benefits of a long term benzodiazepines  use as well as potential risks  and complications were explained to the patient and were aknowledged. Marriage counseling can be pursued            Meds ordered this encounter  Medications   escitalopram (LEXAPRO) 5 MG  tablet    Sig: Take 1 tablet (5 mg total) by mouth at bedtime.    Dispense:  30 tablet    Refill:  5   ALPRAZolam (XANAX) 0.5 MG tablet    Sig: Take 1 tablet (0.5 mg total) by mouth 2 (two) times daily as needed for anxiety.    Dispense:  60 tablet    Refill:  2      Follow-up: Return in about 2 months (around 04/25/2023) for a follow-up visit.  Sonda Primes, MD

## 2023-02-23 NOTE — Assessment & Plan Note (Addendum)
Mild cognitive deficit. No dementia. MMSE 30/30. Face of a clock drawing - perfect

## 2023-03-15 DIAGNOSIS — Z85828 Personal history of other malignant neoplasm of skin: Secondary | ICD-10-CM | POA: Diagnosis not present

## 2023-03-15 DIAGNOSIS — L570 Actinic keratosis: Secondary | ICD-10-CM | POA: Diagnosis not present

## 2023-03-15 DIAGNOSIS — I781 Nevus, non-neoplastic: Secondary | ICD-10-CM | POA: Diagnosis not present

## 2023-03-15 DIAGNOSIS — Z85820 Personal history of malignant melanoma of skin: Secondary | ICD-10-CM | POA: Diagnosis not present

## 2023-03-15 DIAGNOSIS — L821 Other seborrheic keratosis: Secondary | ICD-10-CM | POA: Diagnosis not present

## 2023-03-15 DIAGNOSIS — L814 Other melanin hyperpigmentation: Secondary | ICD-10-CM | POA: Diagnosis not present

## 2023-04-03 ENCOUNTER — Other Ambulatory Visit: Payer: Self-pay | Admitting: Internal Medicine

## 2023-04-14 DIAGNOSIS — N200 Calculus of kidney: Secondary | ICD-10-CM | POA: Diagnosis not present

## 2023-04-14 DIAGNOSIS — I129 Hypertensive chronic kidney disease with stage 1 through stage 4 chronic kidney disease, or unspecified chronic kidney disease: Secondary | ICD-10-CM | POA: Diagnosis not present

## 2023-04-14 DIAGNOSIS — E039 Hypothyroidism, unspecified: Secondary | ICD-10-CM | POA: Diagnosis not present

## 2023-04-14 DIAGNOSIS — N1832 Chronic kidney disease, stage 3b: Secondary | ICD-10-CM | POA: Diagnosis not present

## 2023-04-15 LAB — LAB REPORT - SCANNED
Albumin, Urine POC: 14.3
Microalb Creat Ratio: 8

## 2023-06-21 ENCOUNTER — Other Ambulatory Visit: Payer: Self-pay | Admitting: Internal Medicine

## 2023-09-06 ENCOUNTER — Telehealth: Payer: Self-pay | Admitting: Internal Medicine

## 2023-09-06 NOTE — Telephone Encounter (Signed)
 Pt's wife called in to relay some concerns with the pt. Ms. Bobbye Burrow states that the pt's mood and memory have not improved since his visit on 10.16.24, where he was prescribed Lexapro  5MG  and Xanex .5MG  to help with his volatile mood swings.   Pt was supposed to return for a f/u in December 2024 but never returned.   Ms. Bobbye Burrow states pt is increasingly confused, forgets where he is, and where things in their home are. Also states that when he gets worked up he 'will throw a fit' and then sit alone and won't speak to anyone for hours. These events happen at random. Pt often speaks harshly, shouts, and won't discuss why he is so irritated or his medications with his wife.   Ms. Bobbye Burrow noted that there is a history of dementia and bipolar disorder in his family, but is unsure if these histories are the cause of his changes. She is extremely concerned but does not know what to do.   I contacted the [facility] who states the pt had 2 refills on his Lexapro , so he is at least picking up his medication, but wife is not sure he is taking it, or if he is it is not very effective.  Please assist.

## 2023-09-06 NOTE — Telephone Encounter (Signed)
 Copied from CRM [id number]. Topic: Clinical - Medical Advice >> Sep 06, 2023 11:11 AM Earnestine Goes B wrote: Reason for CRM: pt's wife called to speak with nurse regarding medication the patient is taking. Please call her back at [phone] Donald Phillips

## 2023-09-07 NOTE — Telephone Encounter (Signed)
 1st attempt @ trying to contact pts wife Bobbye Burrow to inform her of the following advice per PCP "I am sorry.  Please have him see me in the office for an appointment.  He can double his Lexapro  dose and take 10 mg a day in the meantime.  Thank you."  **Pt is needing to be scheduled to see PCP.

## 2023-09-07 NOTE — Telephone Encounter (Signed)
 I am sorry.  Please have him see me in the office for an appointment.  He can double his Lexapro  dose and take 10 mg a day in the meantime.  Thank you

## 2023-09-08 ENCOUNTER — Ambulatory Visit (INDEPENDENT_AMBULATORY_CARE_PROVIDER_SITE_OTHER): Payer: Medicare Other

## 2023-09-08 VITALS — Ht 69.0 in | Wt 190.0 lb

## 2023-09-08 DIAGNOSIS — Z1212 Encounter for screening for malignant neoplasm of rectum: Secondary | ICD-10-CM

## 2023-09-08 DIAGNOSIS — Z1159 Encounter for screening for other viral diseases: Secondary | ICD-10-CM | POA: Diagnosis not present

## 2023-09-08 DIAGNOSIS — Z1211 Encounter for screening for malignant neoplasm of colon: Secondary | ICD-10-CM

## 2023-09-08 DIAGNOSIS — Z0000 Encounter for general adult medical examination without abnormal findings: Secondary | ICD-10-CM | POA: Diagnosis not present

## 2023-09-08 NOTE — Addendum Note (Signed)
 Addended by: Mylasia Vorhees L on: 09/08/2023 10:07 AM   Modules accepted: Orders

## 2023-09-08 NOTE — Patient Instructions (Signed)
 Mr. Brockmann , Thank you for taking time to come for your Medicare Wellness Visit. I appreciate your ongoing commitment to your health goals. Please review the following plan we discussed and let me know if I can assist you in the future.   Referrals/Orders/Follow-Ups/Clinician Recommendations: It was nice talking with you today.  You are due for a Hep C screening and can get that done during your next office visit.  You are due for a Cologuard to be done in September of this year.  Each day, aim for 6 glasses of water, plenty of protein in your diet and try to get up and walk/ stretch every hour for 5-10 minutes at a time.    This is a list of the screening recommended for you and due dates:  Health Maintenance  Topic Date Due   Hepatitis C Screening  Never done   COVID-19 Vaccine (4 - 2024-25 season) 01/09/2023   Medicare Annual Wellness Visit  09/07/2023   Flu Shot  12/09/2023   DTaP/Tdap/Td vaccine (2 - Tdap) 12/22/2024   Pneumonia Vaccine  Completed   Zoster (Shingles) Vaccine  Completed   HPV Vaccine  Aged Out   Meningitis B Vaccine  Aged Out   Cologuard (Stool DNA test)  Discontinued    Advanced directives: (Copy Requested) Please bring a copy of your health care power of attorney and living will to the office to be added to your chart at your convenience. You can mail to [facility] [address] [place], [place] [address] or email to [email or web address]  Next Medicare Annual Wellness Visit scheduled for next year: Yes

## 2023-09-08 NOTE — Addendum Note (Signed)
 Addended by: Romone Shaff L on: 09/08/2023 10:08 AM   Modules accepted: Orders

## 2023-09-08 NOTE — Progress Notes (Cosign Needed Addendum)
 Subjective:   Donald Fleming. is a 78 y.o. who presents for a Medicare Wellness preventive visit.  Visit Complete: Virtual I connected with  Donald Fleming. on 09/08/23 by a audio enabled telemedicine application and verified that I am speaking with the correct person using two identifiers.  Patient Location: Home  Provider Location: Office/Clinic  I discussed the limitations of evaluation and management by telemedicine. The patient expressed understanding and agreed to proceed.  Vital Signs: Because this visit was a virtual/telehealth visit, some criteria may be missing or patient reported. Any vitals not documented were not able to be obtained and vitals that have been documented are patient reported.  VideoDeclined- This patient declined Librarian, academic. Therefore the visit was completed with audio only.  Persons Participating in Visit: Patient.  AWV Questionnaire: No: Patient Medicare AWV questionnaire was not completed prior to this visit.  Cardiac Risk Factors include: advanced age (>33men, >46 women);hypertension;male gender;dyslipidemia;Other (see comment), Risk factor comments: Coronary atherosclerosis, Angina pectoris, unstable     Objective:    Today's Vitals   09/08/23 0932  Weight: 190 lb (86.2 kg)  Height: 5\' 9"  (1.753 m)   Body mass index is 28.06 kg/m.     09/08/2023    9:39 AM 09/07/2022    9:25 AM 01/10/2020   11:22 AM 10/05/2011   11:51 AM  Advanced Directives  Does Patient Have a Medical Advance Directive? Yes Yes Yes Patient does not have advance directive;Patient has advance directive, copy not in chart  Type of Advance Directive Healthcare Power of [place];Living will Healthcare Power of [place];Living will Healthcare Power of [place];Living will Living will  Copy of Healthcare Power of Attorney in Chart? No - copy requested No - copy requested No - copy requested Copy requested from other (Comment)    Current  Medications (verified) Outpatient Encounter Medications as of 09/08/2023  Medication Sig   Alirocumab (PRALUENT) 150 MG/ML SOAJ Inject into the skin.   ALPRAZolam  (XANAX ) 0.5 MG tablet Take 1 tablet (0.5 mg total) by mouth 2 (two) times daily as needed for anxiety.   ascorbic acid  (VITAMIN C ) 500 MG tablet Take 500 mg by mouth 2 (two) times daily.   aspirin  EC 81 MG tablet Take 81 mg by mouth daily.   Biotin 5000 MCG TABS Take 1 tablet by mouth daily.   Cholecalciferol  (VITAMIN D3) 1000 UNITS tablet Take 1,000 Units by mouth daily.   Cyanocobalamin  (VITAMIN B-12) 1000 MCG SUBL Place 1 tablet (1,000 mcg total) under the tongue daily.   DENTA 5000 PLUS 1.1 % CREA dental cream Take by mouth at bedtime.   Docusate Sodium  (DSS) 100 MG CAPS Take by mouth as needed.   escitalopram  (LEXAPRO ) 5 MG tablet Take 1 tablet (5 mg total) by mouth at bedtime.   fenofibrate  (TRICOR ) 145 MG tablet Take 1 tablet by mouth once daily   Fluocinolone Acetonide 0.01 % OIL SMARTSIG:3 Drop(s) In Ear(s) Once a Week   levothyroxine  (SYNTHROID ) 150 MCG tablet TAKE 1 TABLET BY MOUTH EVERY OTHER DAY ALTERNATING WITH 1/2 TABLET EVERY OTHER DAY. TAKE 30 MINUTES BEFORE BREAKFAST   nitroGLYCERIN  (NITROSTAT ) 0.4 MG SL tablet Place 1 tablet (0.4 mg total) under the tongue every 5 (five) minutes as needed for chest pain.   Plecanatide  (TRULANCE ) 3 MG TABS Take 3 mg by mouth daily. For constipation   terazosin  (HYTRIN ) 5 MG capsule Take 1 capsule by mouth at bedtime   Turmeric Curcumin 500 MG CAPS Take  by mouth 1 day or 1 dose.   zinc gluconate 50 MG tablet Take 50 mg by mouth daily.   sildenafil  (VIAGRA ) 100 MG tablet Take 1 tablet (100 mg total) by mouth as needed for erectile dysfunction.   No facility-administered encounter medications on file as of 09/08/2023.    Allergies (verified) 2,4-d dimethylamine   History: Past Medical History:  Diagnosis Date   Allergic rhinitis    Asthma    remote hx   Chronic kidney disease     stones   Diverticulosis of colon    Hemorrhoid    Hyperlipidemia    Hypertension    Impotence of organic origin    Other testicular hypofunction    Unspecified hypothyroidism    Past Surgical History:  Procedure Laterality Date   distal radial fracture  10/04/2011   HEMORRHOID SURGERY     INGUINAL HERNIA REPAIR     vastectomy     Family History  Problem Relation Age of Onset   Dementia Mother    Cancer Father        prostate   Heart disease Father        heart attack   Stroke Father    Cancer Sister    Cancer Other        prostate   Social History   Socioeconomic History   Marital status: Married    Spouse name: Bobbye Burrow   Number of children: 4   Years of education: Not on file   Highest education level: Not on file  Occupational History   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  Tobacco Use   Smoking status: Former   Smokeless tobacco: Former  Building services engineer status: Never Used  Substance and Sexual Activity   Alcohol use: No   Drug use: No   Sexual activity: Yes  Other Topics Concern   Not on file  Social History Narrative   6 grandchildren.      Lives with wife/2025   Social Drivers of Health   Financial Resource Strain: Low Risk  (09/08/2023)   Overall Financial Resource Strain (CARDIA)    Difficulty of Paying Living Expenses: Not very hard  Food Insecurity: No Food Insecurity (09/08/2023)   Hunger Vital Sign    Worried About Running Out of Food in the Last Year: Never true    Ran Out of Food in the Last Year: Never true  Transportation Needs: No Transportation Needs (09/08/2023)   PRAPARE - Administrator, Civil Service (Medical): No    Lack of Transportation (Non-Medical): No  Physical Activity: Inactive (09/08/2023)   Exercise Vital Sign    Days of Exercise per Week: 0 days    Minutes of Exercise per Session: 0 min  Stress: No Stress Concern Present (09/08/2023)   [employer] of Occupational  Health - Occupational Stress Questionnaire    Feeling of Stress : Only a little  Social Connections: Socially Integrated (09/08/2023)   Social Connection and Isolation Panel [NHANES]    Frequency of Communication with Friends and Family: More than three times a week    Frequency of Social Gatherings with Friends and Family: Once a week    Attends Religious Services: More than 4 times per year    Active Member of [employer] or Organizations: Yes    Attends Banker Meetings: 1 to 4 times per year    Marital Status: Married    Tobacco Counseling Counseling  given: Not Answered    Clinical Intake:  Pre-visit preparation completed: Yes  Pain : No/denies pain     BMI - recorded: 28.06 Nutritional Status: BMI 25 -29 Overweight Nutritional Risks: None Diabetes: No  Lab Results  Component Value Date   HGBA1C 5.7 07/03/2018   HGBA1C 5.9 09/04/2015   HGBA1C 5.9 12/18/2014     How often do you need to have someone help you when you read instructions, pamphlets, or other written materials from your doctor or pharmacy?: 1 - Never  Interpreter Needed?: No  Information entered by :: Lockie Bothun, RMA   Activities of Daily Living     09/08/2023    9:33 AM  In your present state of health, do you have any difficulty performing the following activities:  Hearing? 1  Comment Wears hearing aides  Vision? 0  Difficulty concentrating or making decisions? 0  Walking or climbing stairs? 0  Dressing or bathing? 0  Doing errands, shopping? 0  Preparing Food and eating ? N  Using the Toilet? N  In the past six months, have you accidently leaked urine? N  Do you have problems with loss of bowel control? N  Managing your Medications? N  Managing your Finances? N  Housekeeping or managing your Housekeeping? N    Patient Care Team: Plotnikov, Oakley Bellman, MD as PCP - General Willy Harvest Jerrye Mori, MD as Consulting Physician (Gastroenterology) Leandra Pro, MD as Consulting  Physician (Nephrology)  Indicate any recent Medical Services you may have received from other than [facility] providers in the past year (date may be approximate).     Assessment:   This is a routine wellness examination for Donald Phillips.  Hearing/Vision screen Hearing Screening - Comments:: Wears hearing aides  Vision Screening - Comments:: Wears eyeglasses   Goals Addressed             This Visit's Progress    My goal for 2024 is to get back to walking and being more physically active.   On track      Depression Screen     09/08/2023    9:43 AM 09/07/2022    9:39 AM 09/07/2022    9:31 AM 09/02/2022    1:58 PM 04/26/2022    9:27 AM 03/29/2022    9:05 AM 01/22/2021    8:05 AM  PHQ 2/9 Scores  PHQ - 2 Score 0 0 0 0 0 0 0  PHQ- 9 Score 0 0     0    Fall Risk     09/08/2023    9:40 AM 09/07/2022    9:26 AM 09/03/2022   12:39 PM 09/02/2022    1:58 PM 04/26/2022    9:27 AM  Fall Risk   Falls in the past year? 0 0 0 0 0  Number falls in past yr: 0 0 0 0 0  Injury with Fall? 0 0 0 0 0  Risk for fall due to : No Fall Risks No Fall Risks  No Fall Risks No Fall Risks  Follow up Falls prevention discussed;Falls evaluation completed Falls prevention discussed  Falls evaluation completed Falls evaluation completed    MEDICARE RISK AT HOME:  Medicare Risk at Home Any stairs in or around the home?: No Home free of loose throw rugs in walkways, pet beds, electrical cords, etc?: Yes Adequate lighting in your home to reduce risk of falls?: Yes Life alert?: No Use of a cane, walker or w/c?: No Grab bars in the bathroom?: No Shower  chair or bench in shower?: No Elevated toilet seat or a handicapped toilet?: No  TIMED UP AND GO:  Was the test performed?  No  Cognitive Function: Declined/Normal: No cognitive concerns noted by patient or family. Patient alert, oriented, able to answer questions appropriately and recall recent events. No signs of memory loss or confusion.        09/07/2022     9:28 AM  6CIT Screen  What Year? 0 points  What month? 0 points  What time? 0 points  Count back from 20 0 points  Months in reverse 0 points  Repeat phrase 0 points  Total Score 0 points    Immunizations Immunization History  Administered Date(s) Administered   Fluad Quad(high Dose 65+) 01/03/2019, 01/22/2021, 02/18/2022   Influenza, High Dose Seasonal PF 01/16/2016, 05/24/2017, 02/27/2018   Influenza,inj,Quad PF,6+ Mos 02/19/2013, 04/19/2014   PFIZER Comirnaty(Gray Top)Covid-19 Tri-Sucrose Vaccine 08/26/2020   PFIZER(Purple Top)SARS-COV-2 Vaccination 07/12/2019, 08/02/2019   Pneumococcal Conjugate-13 12/21/2013   Pneumococcal Polysaccharide-23 04/19/2014   Td 12/23/2014   Zoster Recombinant(Shingrix) 11/27/2016, 05/31/2017    Screening Tests Health Maintenance  Topic Date Due   Hepatitis C Screening  Never done   COVID-19 Vaccine (4 - 2024-25 season) 01/09/2023   Medicare Annual Wellness (AWV)  09/07/2023   INFLUENZA VACCINE  12/09/2023   DTaP/Tdap/Td (2 - Tdap) 12/22/2024   Pneumonia Vaccine 62+ Years old  Completed   Zoster Vaccines- Shingrix  Completed   HPV VACCINES  Aged Out   Meningococcal B Vaccine  Aged Out   Fecal DNA (Cologuard)  Discontinued    Health Maintenance  Health Maintenance Due  Topic Date Due   Hepatitis C Screening  Never done   COVID-19 Vaccine (4 - 2024-25 season) 01/09/2023   Medicare Annual Wellness (AWV)  09/07/2023   Health Maintenance Items Addressed: Cologuard Ordered, Hepatitis C Screening, See Nurse Notes  Additional Screening:  Vision Screening: Recommended annual ophthalmology exams for early detection of glaucoma and other disorders of the eye.  Dental Screening: Recommended annual dental exams for proper oral hygiene  Community Resource Referral / Chronic Care Management: CRR required this visit?  No   CCM required this visit?  No     Plan:     I have personally reviewed and noted the following in the  patient's chart:   Medical and social history Use of alcohol, tobacco or illicit drugs  Current medications and supplements including opioid prescriptions. Patient is not currently taking opioid prescriptions. Functional ability and status Nutritional status Physical activity Advanced directives List of other physicians Hospitalizations, surgeries, and ER visits in previous 12 months Vitals Screenings to include cognitive, depression, and falls Referrals and appointments  In addition, I have reviewed and discussed with patient certain preventive protocols, quality metrics, and best practice recommendations. A written personalized care plan for preventive services as well as general preventive health recommendations were provided to patient.     Pearly Apachito L Atwood Adcock, CMA   09/08/2023   After Visit Summary: (MyChart) Due to this being a telephonic visit, the after visit summary with patients personalized plan was offered to patient via MyChart   Notes: Please refer to Routing Comments.  Medical screening examination/treatment/procedure(s) were performed by non-physician practitioner and as supervising physician I was immediately available for consultation/collaboration.  I agree with above. Adelaide Holy, MD

## 2023-09-13 DIAGNOSIS — Z23 Encounter for immunization: Secondary | ICD-10-CM | POA: Diagnosis not present

## 2023-09-19 DIAGNOSIS — L600 Ingrowing nail: Secondary | ICD-10-CM | POA: Diagnosis not present

## 2023-09-19 DIAGNOSIS — S40011A Contusion of right shoulder, initial encounter: Secondary | ICD-10-CM | POA: Diagnosis not present

## 2023-09-19 DIAGNOSIS — L609 Nail disorder, unspecified: Secondary | ICD-10-CM | POA: Diagnosis not present

## 2023-09-19 DIAGNOSIS — M25511 Pain in right shoulder: Secondary | ICD-10-CM | POA: Diagnosis not present

## 2023-09-23 DIAGNOSIS — S46011A Strain of muscle(s) and tendon(s) of the rotator cuff of right shoulder, initial encounter: Secondary | ICD-10-CM | POA: Diagnosis not present

## 2023-09-23 DIAGNOSIS — S40011A Contusion of right shoulder, initial encounter: Secondary | ICD-10-CM | POA: Diagnosis not present

## 2023-09-25 ENCOUNTER — Other Ambulatory Visit: Payer: Self-pay | Admitting: Internal Medicine

## 2023-09-28 DIAGNOSIS — S46011D Strain of muscle(s) and tendon(s) of the rotator cuff of right shoulder, subsequent encounter: Secondary | ICD-10-CM | POA: Diagnosis not present

## 2023-10-27 DIAGNOSIS — M7541 Impingement syndrome of right shoulder: Secondary | ICD-10-CM | POA: Diagnosis not present

## 2023-10-27 DIAGNOSIS — M7581 Other shoulder lesions, right shoulder: Secondary | ICD-10-CM | POA: Diagnosis not present

## 2023-10-27 DIAGNOSIS — M24111 Other articular cartilage disorders, right shoulder: Secondary | ICD-10-CM | POA: Diagnosis not present

## 2023-10-27 DIAGNOSIS — M24211 Disorder of ligament, right shoulder: Secondary | ICD-10-CM | POA: Diagnosis not present

## 2023-10-27 DIAGNOSIS — M75121 Complete rotator cuff tear or rupture of right shoulder, not specified as traumatic: Secondary | ICD-10-CM | POA: Diagnosis not present

## 2023-10-27 DIAGNOSIS — M75111 Incomplete rotator cuff tear or rupture of right shoulder, not specified as traumatic: Secondary | ICD-10-CM | POA: Diagnosis not present

## 2023-10-27 DIAGNOSIS — M7521 Bicipital tendinitis, right shoulder: Secondary | ICD-10-CM | POA: Diagnosis not present

## 2023-10-27 DIAGNOSIS — M19011 Primary osteoarthritis, right shoulder: Secondary | ICD-10-CM | POA: Diagnosis not present

## 2023-10-27 DIAGNOSIS — G8918 Other acute postprocedural pain: Secondary | ICD-10-CM | POA: Diagnosis not present

## 2023-10-27 DIAGNOSIS — M71811 Other specified bursopathies, right shoulder: Secondary | ICD-10-CM | POA: Diagnosis not present

## 2023-11-10 DIAGNOSIS — D235 Other benign neoplasm of skin of trunk: Secondary | ICD-10-CM | POA: Diagnosis not present

## 2023-11-10 DIAGNOSIS — Z85828 Personal history of other malignant neoplasm of skin: Secondary | ICD-10-CM | POA: Diagnosis not present

## 2023-11-10 DIAGNOSIS — L821 Other seborrheic keratosis: Secondary | ICD-10-CM | POA: Diagnosis not present

## 2023-11-10 DIAGNOSIS — L814 Other melanin hyperpigmentation: Secondary | ICD-10-CM | POA: Diagnosis not present

## 2023-11-10 DIAGNOSIS — D225 Melanocytic nevi of trunk: Secondary | ICD-10-CM | POA: Diagnosis not present

## 2023-11-10 DIAGNOSIS — L579 Skin changes due to chronic exposure to nonionizing radiation, unspecified: Secondary | ICD-10-CM | POA: Diagnosis not present

## 2023-11-10 DIAGNOSIS — Z85820 Personal history of malignant melanoma of skin: Secondary | ICD-10-CM | POA: Diagnosis not present

## 2023-11-10 DIAGNOSIS — L570 Actinic keratosis: Secondary | ICD-10-CM | POA: Diagnosis not present

## 2024-01-06 ENCOUNTER — Telehealth: Payer: Self-pay

## 2024-01-06 NOTE — Telephone Encounter (Signed)
 Copied from CRM [id number]. Topic: General - Other >> Jan 06, 2024 11:26 AM [place] E wrote: Reason for CRM: Patient's wife, Beverley, called in wanting to speak with PCP's nurse. Beverley stating that she has brought this up twice in the last year of patient displaying autistic tendencies and would like to further discuss. Patient stated she will callback next week as well.

## 2024-01-09 NOTE — Telephone Encounter (Signed)
 Is it possible that she and Zell would come together for an appointment to discuss the issue? Thanks

## 2024-01-11 ENCOUNTER — Ambulatory Visit: Admitting: Internal Medicine

## 2024-01-11 ENCOUNTER — Encounter: Payer: Self-pay | Admitting: Internal Medicine

## 2024-01-11 VITALS — BP 131/65 | HR 66 | Temp 97.5°F | Ht 69.0 in | Wt 195.0 lb

## 2024-01-11 DIAGNOSIS — E785 Hyperlipidemia, unspecified: Secondary | ICD-10-CM

## 2024-01-11 DIAGNOSIS — F09 Unspecified mental disorder due to known physiological condition: Secondary | ICD-10-CM | POA: Diagnosis not present

## 2024-01-11 DIAGNOSIS — N1831 Chronic kidney disease, stage 3a: Secondary | ICD-10-CM | POA: Diagnosis not present

## 2024-01-11 DIAGNOSIS — E538 Deficiency of other specified B group vitamins: Secondary | ICD-10-CM

## 2024-01-11 DIAGNOSIS — Z0000 Encounter for general adult medical examination without abnormal findings: Secondary | ICD-10-CM

## 2024-01-11 DIAGNOSIS — N320 Bladder-neck obstruction: Secondary | ICD-10-CM | POA: Diagnosis not present

## 2024-01-11 DIAGNOSIS — I2583 Coronary atherosclerosis due to lipid rich plaque: Secondary | ICD-10-CM

## 2024-01-11 DIAGNOSIS — F489 Nonpsychotic mental disorder, unspecified: Secondary | ICD-10-CM

## 2024-01-11 DIAGNOSIS — F329 Major depressive disorder, single episode, unspecified: Secondary | ICD-10-CM

## 2024-01-11 LAB — URINALYSIS
Bilirubin Urine: NEGATIVE
Ketones, ur: NEGATIVE
Leukocytes,Ua: NEGATIVE
Nitrite: NEGATIVE
Specific Gravity, Urine: 1.025 (ref 1.000–1.030)
Total Protein, Urine: NEGATIVE
Urine Glucose: NEGATIVE
Urobilinogen, UA: 0.2 (ref 0.0–1.0)
pH: 5.5 (ref 5.0–8.0)

## 2024-01-11 LAB — LIPID PANEL
Cholesterol: 241 mg/dL — ABNORMAL HIGH (ref 0–200)
HDL: 42.6 mg/dL (ref >39.00)
LDL Cholesterol: 154 mg/dL — ABNORMAL HIGH (ref 0–99)
NonHDL: 198.89
Total CHOL/HDL Ratio: 6
Triglycerides: 222 mg/dL — ABNORMAL HIGH (ref 0.0–149.0)
VLDL: 44.4 mg/dL — ABNORMAL HIGH (ref 0.0–40.0)

## 2024-01-11 LAB — PSA: PSA: 4.31 ng/mL — ABNORMAL HIGH (ref 0.10–4.00)

## 2024-01-11 LAB — CBC WITH DIFFERENTIAL/PLATELET
Basophils Absolute: 0 K/uL (ref 0.0–0.1)
Basophils Relative: 0.4 % (ref 0.0–3.0)
Eosinophils Absolute: 0.2 K/uL (ref 0.0–0.7)
Eosinophils Relative: 2.5 % (ref 0.0–5.0)
HCT: 43.5 % (ref 39.0–52.0)
Hemoglobin: 14.3 g/dL (ref 13.0–17.0)
Lymphocytes Relative: 21.6 % (ref 12.0–46.0)
Lymphs Abs: 1.4 K/uL (ref 0.7–4.0)
MCHC: 32.9 g/dL (ref 30.0–36.0)
MCV: 97.3 fl (ref 78.0–100.0)
Monocytes Absolute: 0.5 K/uL (ref 0.1–1.0)
Monocytes Relative: 8.1 % (ref 3.0–12.0)
Neutro Abs: 4.3 K/uL (ref 1.4–7.7)
Neutrophils Relative %: 67.4 % (ref 43.0–77.0)
Platelets: 217 K/uL (ref 150.0–400.0)
RBC: 4.47 Mil/uL (ref 4.22–5.81)
RDW: 14.3 % (ref 11.5–15.5)
WBC: 6.4 K/uL (ref 4.0–10.5)

## 2024-01-11 LAB — COMPREHENSIVE METABOLIC PANEL WITH GFR
ALT: 13 U/L (ref 0–53)
AST: 17 U/L (ref 0–37)
Albumin: 4.2 g/dL (ref 3.5–5.2)
Alkaline Phosphatase: 68 U/L (ref 39–117)
BUN: 19 mg/dL (ref 6–23)
CO2: 28 meq/L (ref 19–32)
Calcium: 9 mg/dL (ref 8.4–10.5)
Chloride: 105 meq/L (ref 96–112)
Creatinine, Ser: 1.14 mg/dL (ref 0.40–1.50)
GFR: 62.27 mL/min (ref >60.00)
Glucose, Bld: 100 mg/dL — ABNORMAL HIGH (ref 70–99)
Potassium: 4.7 meq/L (ref 3.5–5.1)
Sodium: 142 meq/L (ref 135–145)
Total Bilirubin: 0.7 mg/dL (ref 0.2–1.2)
Total Protein: 6.9 g/dL (ref 6.0–8.3)

## 2024-01-11 LAB — VITAMIN B12: Vitamin B-12: 370 pg/mL (ref 211–911)

## 2024-01-11 LAB — TSH: TSH: 4.57 u[IU]/mL (ref 0.35–5.50)

## 2024-01-11 NOTE — Assessment & Plan Note (Signed)
 2020 Coronary calcium  CT score of 276.

## 2024-01-11 NOTE — Progress Notes (Signed)
 Subjective:  Patient ID: Donald DELENA Jeannetta Mickey., male    DOB: 1946/03/28  Age: 78 y.o. MRN: [id number]  CC: Annual Exam (Physical; want to have blood work done)   HPI [employer]. presents for a well exam F/u on CAD Son died at 80 in Oct 29, 2023  Outpatient Medications Prior to Visit  Medication Sig Dispense Refill   Alirocumab (PRALUENT) 150 MG/ML SOAJ Inject into the skin.     ALPRAZolam  (XANAX ) 0.5 MG tablet Take 1 tablet (0.5 mg total) by mouth 2 (two) times daily as needed for anxiety. 60 tablet 2   ascorbic acid  (VITAMIN C ) 500 MG tablet Take 500 mg by mouth 2 (two) times daily.     aspirin  EC 81 MG tablet Take 81 mg by mouth daily.     Biotin 5000 MCG TABS Take 1 tablet by mouth daily.     Cholecalciferol  (VITAMIN D3) 1000 UNITS tablet Take 1,000 Units by mouth daily.     Cyanocobalamin  (VITAMIN B-12) 1000 MCG SUBL Place 1 tablet (1,000 mcg total) under the tongue daily. 100 tablet 3   DENTA 5000 PLUS 1.1 % CREA dental cream Take by mouth at bedtime.     Docusate Sodium  (DSS) 100 MG CAPS Take by mouth as needed.     escitalopram  (LEXAPRO ) 5 MG tablet Take 1 tablet (5 mg total) by mouth at bedtime. 30 tablet 5   fenofibrate  (TRICOR ) 145 MG tablet Take 1 tablet by mouth once daily 90 tablet 0   Fluocinolone Acetonide 0.01 % OIL SMARTSIG:3 Drop(s) In Ear(s) Once a Week     levothyroxine  (SYNTHROID ) 150 MCG tablet TAKE 1 TABLET BY MOUTH EVERY OTHER DAY ALTERNATING WITH 1/2 (ONE-HALF) EVERY OTHER DAY .TAKE 30 MINUTES BEFORE BREAKFAST 68 tablet 3   nitroGLYCERIN  (NITROSTAT ) 0.4 MG SL tablet Place 1 tablet (0.4 mg total) under the tongue every 5 (five) minutes as needed for chest pain. 20 tablet 3   Plecanatide  (TRULANCE ) 3 MG TABS Take 3 mg by mouth daily. For constipation 30 tablet 11   SHINGRIX injection Inject 0.5 mLs into the muscle once.     terazosin  (HYTRIN ) 5 MG capsule Take 1 capsule by mouth at bedtime 90 capsule 3   Turmeric Curcumin 500 MG CAPS Take by mouth 1 day or 1  dose.     zinc gluconate 50 MG tablet Take 50 mg by mouth daily.     sildenafil  (VIAGRA ) 100 MG tablet Take 1 tablet (100 mg total) by mouth as needed for erectile dysfunction. (Patient not taking: Reported on 01/11/2024) 30 tablet 5   No facility-administered medications prior to visit.    ROS: Review of Systems  Constitutional:  Negative for appetite change, fatigue and unexpected weight change.  HENT:  Negative for congestion, nosebleeds, sneezing, sore throat and trouble swallowing.   Eyes:  Negative for itching and visual disturbance.  Respiratory:  Negative for cough.   Cardiovascular:  Negative for chest pain, palpitations and leg swelling.  Gastrointestinal:  Negative for abdominal distention, blood in stool, diarrhea and nausea.  Genitourinary:  Negative for frequency and hematuria.  Musculoskeletal:  Negative for back pain, gait problem, joint swelling and neck pain.  Skin:  Negative for rash.  Neurological:  Negative for dizziness, tremors, speech difficulty and weakness.  Psychiatric/Behavioral:  Negative for agitation, behavioral problems, confusion, decreased concentration, dysphoric mood, hallucinations, sleep disturbance and suicidal ideas. The patient is nervous/anxious.    Not homicidal, not suicidal  Objective:  BP 131/65  Pulse 66   Temp (!) 97.5 F (36.4 C)   Ht 5' 9 (1.753 m)   Wt 195 lb (88.5 kg)   SpO2 95%   BMI 28.80 kg/m   BP Readings from Last 3 Encounters:  01/11/24 131/65  02/23/23 120/70  09/02/22 122/70    Wt Readings from Last 3 Encounters:  01/11/24 195 lb (88.5 kg)  09/08/23 190 lb (86.2 kg)  02/23/23 190 lb (86.2 kg)    Physical Exam Constitutional:      General: He is not in acute distress.    Appearance: Normal appearance. He is well-developed.     Comments: NAD  Eyes:     Conjunctiva/sclera: Conjunctivae normal.     Pupils: Pupils are equal, round, and reactive to light.  Neck:     Thyroid : No thyromegaly.     Vascular: No  JVD.  Cardiovascular:     Rate and Rhythm: Normal rate and regular rhythm.     Heart sounds: Normal heart sounds. No murmur heard.    No friction rub. No gallop.  Pulmonary:     Effort: Pulmonary effort is normal. No respiratory distress.     Breath sounds: Normal breath sounds. No wheezing or rales.  Chest:     Chest wall: No tenderness.  Abdominal:     General: Bowel sounds are normal. There is no distension.     Palpations: Abdomen is soft. There is no mass.     Tenderness: There is no abdominal tenderness. There is no guarding or rebound.  Musculoskeletal:        General: No tenderness. Normal range of motion.     Cervical back: Normal range of motion.  Lymphadenopathy:     Cervical: No cervical adenopathy.  Skin:    General: Skin is warm and dry.     Findings: No rash.  Neurological:     Mental Status: He is alert and oriented to person, place, and time.     Cranial Nerves: No cranial nerve deficit.     Motor: No abnormal muscle tone.     Coordination: Coordination normal.     Gait: Gait normal.     Deep Tendon Reflexes: Reflexes are normal and symmetric.  Psychiatric:        Behavior: Behavior normal.        Thought Content: Thought content normal.        Judgment: Judgment normal.     Lab Results  Component Value Date   WBC 6.2 03/10/2022   HGB 14.0 03/10/2022   HCT 43 03/10/2022   PLT 231 03/10/2022   GLUCOSE 96 03/29/2022   CHOL 225 (H) 03/29/2022   TRIG 107.0 03/29/2022   HDL 48.00 03/29/2022   LDLDIRECT 117.0 09/04/2015   LDLCALC 155 (H) 03/29/2022   ALT 14 03/29/2022   AST 20 03/29/2022   NA 141 03/29/2022   K 4.5 03/29/2022   CL 106 03/29/2022   CREATININE 1.18 03/29/2022   BUN 15 03/29/2022   CO2 29 03/29/2022   TSH 30.66 (H) 03/29/2022   PSA 3.68 03/29/2022   INR 1.00 10/04/2011   HGBA1C 5.7 07/03/2018    DG Chest 2 View Result Date: 03/21/2022 CLINICAL DATA:  Chronic renal disease. EXAM: CHEST - 2 VIEW COMPARISON:  Chest radiograph  02/19/2013 FINDINGS: The heart size and mediastinal contours are within normal limits. Both lungs are clear. The visualized skeletal structures are unremarkable. IMPRESSION: No active cardiopulmonary disease. Electronically Signed   By: Bard Nicholaus HERO.D.  On: 03/21/2022 19:35    Assessment & Plan:   Problem List Items Addressed This Visit     B12 deficiency   On B12      Relevant Orders   Vitamin B12   Bladder neck obstruction   Relevant Orders   PSA   Coronary atherosclerosis   Relevant Orders   Ambulatory referral to Cardiology   CRF (chronic renal failure)   NAS diet. Continue w/good hydration Monitor GFR      Relevant Orders   TSH   CBC with Differential/Platelet   PSA   Vitamin B12   Depression   Doing better On Lexapro , Lorazepam prn  Potential benefits of a long term benzodiazepines  use as well as potential risks  and complications were explained to the patient and were aknowledged. Grieving. Son died at 72 in 2023/10/22     Dyslipidemia   2020 Coronary calcium  CT score of 276.       Relevant Orders   TSH   Lipid panel   Mild cognitive disorder   Grieving Clinically - no signs of dementia      Mood problem   Discussed a short fuse issue w/disagreements. No h/o verbal or physical altercations. D/c Wellbutrin  Start low dose Lexapro  Xamax prn for rare use  Potential benefits of a long term benzodiazepines  use as well as potential risks  and complications were explained to the patient and were aknowledged. Marriage counseling can be pursued. A more comprehensive memory testing can be pursued too.   Doing better On Lexapro , Lorazepam prn  Potential benefits of a long term benzodiazepines  use as well as potential risks  and complications were explained to the patient and were aknowledged. Grieving. Son died at 31 in 10-22-2023      Well adult exam - Primary    We discussed age appropriate health related issues, including available/recomended  screening tests and vaccinations. Labs were ordered to be later reviewed . All questions were answered. We discussed one or more of the following - seat belt use, use of sunscreen/sun exposure exercise, fall risk reduction, second hand smoke exposure, firearm use and storage, seat belt use, a need for adhering to healthy diet and exercise. Labs were ordered.  All questions were answered. Vaccines up to date Colonoscopy ?2010 in [place] Cologuard q 3 years      Relevant Orders   TSH   Urinalysis   CBC with Differential/Platelet   Lipid panel   PSA   Comprehensive metabolic panel with GFR   Vitamin B12      No orders of the defined types were placed in this encounter.     Follow-up: Return in about 6 months (around 07/10/2024) for a follow-up visit.  Marolyn Noel, MD

## 2024-01-11 NOTE — Assessment & Plan Note (Signed)
 On B12

## 2024-01-11 NOTE — Assessment & Plan Note (Signed)
NAS diet. Continue w/good hydration Monitor GFR

## 2024-01-11 NOTE — Assessment & Plan Note (Signed)
 Doing better On Lexapro , Lorazepam prn  Potential benefits of a long term benzodiazepines  use as well as potential risks  and complications were explained to the patient and were aknowledged. Grieving. Son died at 14 in 11/13/2023

## 2024-01-11 NOTE — Assessment & Plan Note (Addendum)
 Discussed a short fuse issue w/disagreements. No h/o verbal or physical altercations. D/c Wellbutrin  Start low dose Lexapro  Xamax prn for rare use  Potential benefits of a long term benzodiazepines  use as well as potential risks  and complications were explained to the patient and were aknowledged. Marriage counseling can be pursued. A more comprehensive memory testing can be pursued too.   Doing better On Lexapro , Lorazepam prn  Potential benefits of a long term benzodiazepines  use as well as potential risks  and complications were explained to the patient and were aknowledged. Grieving. Son died at 76 in 2023/11/02

## 2024-01-11 NOTE — Assessment & Plan Note (Signed)
 Grieving Clinically - no signs of dementia

## 2024-01-11 NOTE — Assessment & Plan Note (Signed)
  We discussed age appropriate health related issues, including available/recomended screening tests and vaccinations. Labs were ordered to be later reviewed . All questions were answered. We discussed one or more of the following - seat belt use, use of sunscreen/sun exposure exercise, fall risk reduction, second hand smoke exposure, firearm use and storage, seat belt use, a need for adhering to healthy diet and exercise. Labs were ordered.  All questions were answered. Vaccines up to date Colonoscopy ?2010 in [place] Cologuard q 3 years

## 2024-01-12 ENCOUNTER — Ambulatory Visit: Payer: Self-pay | Admitting: Internal Medicine

## 2024-01-12 DIAGNOSIS — I2583 Coronary atherosclerosis due to lipid rich plaque: Secondary | ICD-10-CM

## 2024-01-13 NOTE — Assessment & Plan Note (Signed)
 follow-up with Dr. Launie to discuss twice a month self injections of Repatha  or infusions of Leqvio that you get twice a year infusions of Leqvio

## 2024-01-13 NOTE — Telephone Encounter (Signed)
 Patient had pcp appointment on 09/03

## 2024-01-23 DIAGNOSIS — R931 Abnormal findings on diagnostic imaging of heart and coronary circulation: Secondary | ICD-10-CM | POA: Diagnosis not present

## 2024-01-23 DIAGNOSIS — I2510 Atherosclerotic heart disease of native coronary artery without angina pectoris: Secondary | ICD-10-CM | POA: Diagnosis not present

## 2024-01-23 DIAGNOSIS — E789 Disorder of lipoprotein metabolism, unspecified: Secondary | ICD-10-CM | POA: Diagnosis not present

## 2024-01-23 DIAGNOSIS — Z789 Other specified health status: Secondary | ICD-10-CM | POA: Diagnosis not present

## 2024-01-23 DIAGNOSIS — I10 Essential (primary) hypertension: Secondary | ICD-10-CM | POA: Diagnosis not present

## 2024-01-26 DIAGNOSIS — H43813 Vitreous degeneration, bilateral: Secondary | ICD-10-CM | POA: Diagnosis not present

## 2024-01-26 DIAGNOSIS — H2513 Age-related nuclear cataract, bilateral: Secondary | ICD-10-CM | POA: Diagnosis not present

## 2024-01-26 DIAGNOSIS — H0288B Meibomian gland dysfunction left eye, upper and lower eyelids: Secondary | ICD-10-CM | POA: Diagnosis not present

## 2024-01-26 DIAGNOSIS — H353131 Nonexudative age-related macular degeneration, bilateral, early dry stage: Secondary | ICD-10-CM | POA: Diagnosis not present

## 2024-01-26 DIAGNOSIS — H01005 Unspecified blepharitis left lower eyelid: Secondary | ICD-10-CM | POA: Diagnosis not present

## 2024-01-26 DIAGNOSIS — H04123 Dry eye syndrome of bilateral lacrimal glands: Secondary | ICD-10-CM | POA: Diagnosis not present

## 2024-01-26 DIAGNOSIS — H01001 Unspecified blepharitis right upper eyelid: Secondary | ICD-10-CM | POA: Diagnosis not present

## 2024-01-26 DIAGNOSIS — H01004 Unspecified blepharitis left upper eyelid: Secondary | ICD-10-CM | POA: Diagnosis not present

## 2024-01-26 DIAGNOSIS — H0288A Meibomian gland dysfunction right eye, upper and lower eyelids: Secondary | ICD-10-CM | POA: Diagnosis not present

## 2024-01-26 DIAGNOSIS — H01002 Unspecified blepharitis right lower eyelid: Secondary | ICD-10-CM | POA: Diagnosis not present

## 2024-01-30 ENCOUNTER — Other Ambulatory Visit: Payer: Self-pay | Admitting: Internal Medicine

## 2024-01-30 DIAGNOSIS — F329 Major depressive disorder, single episode, unspecified: Secondary | ICD-10-CM

## 2024-02-09 ENCOUNTER — Telehealth: Payer: Self-pay

## 2024-02-09 NOTE — Telephone Encounter (Signed)
 Copied from CRM [id number]. Topic: General - Other >> Feb 09, 2024  3:02 PM Donald Phillips DEL wrote: Reason for CRM: Patient received a box from [employer] and he was wondering if Dr Garald requested for it to be sent to his home.Patient stated that he not going to open the box until he gets the confirmation for provider he snot sure exactly what it is.

## 2024-02-13 DIAGNOSIS — Z1211 Encounter for screening for malignant neoplasm of colon: Secondary | ICD-10-CM | POA: Diagnosis not present

## 2024-02-13 DIAGNOSIS — Z1212 Encounter for screening for malignant neoplasm of rectum: Secondary | ICD-10-CM | POA: Diagnosis not present

## 2024-02-16 NOTE — Telephone Encounter (Signed)
 Called and informed patient that order had bene placed by Dr.Plotnikov back in May and that we weren't sure why it had taken so long to reach him. He has since provided sample and mailed it out sometime last week

## 2024-02-19 LAB — COLOGUARD: COLOGUARD: POSITIVE — AB

## 2024-02-20 ENCOUNTER — Other Ambulatory Visit: Payer: Self-pay | Admitting: Internal Medicine

## 2024-02-20 ENCOUNTER — Ambulatory Visit: Payer: Self-pay | Admitting: Internal Medicine

## 2024-02-20 DIAGNOSIS — R195 Other fecal abnormalities: Secondary | ICD-10-CM

## 2024-02-28 ENCOUNTER — Encounter: Payer: Self-pay | Admitting: Gastroenterology

## 2024-02-28 DIAGNOSIS — H6123 Impacted cerumen, bilateral: Secondary | ICD-10-CM | POA: Diagnosis not present

## 2024-03-10 ENCOUNTER — Other Ambulatory Visit: Payer: Self-pay | Admitting: Internal Medicine

## 2024-03-10 DIAGNOSIS — F329 Major depressive disorder, single episode, unspecified: Secondary | ICD-10-CM

## 2024-03-21 DIAGNOSIS — E789 Disorder of lipoprotein metabolism, unspecified: Secondary | ICD-10-CM | POA: Diagnosis not present

## 2024-03-23 ENCOUNTER — Other Ambulatory Visit: Payer: Self-pay | Admitting: Internal Medicine

## 2024-04-17 ENCOUNTER — Ambulatory Visit: Admitting: Gastroenterology

## 2024-05-30 ENCOUNTER — Ambulatory Visit: Admitting: Gastroenterology

## 2024-06-19 ENCOUNTER — Ambulatory Visit: Admitting: Internal Medicine

## 2024-09-10 ENCOUNTER — Ambulatory Visit
